# Patient Record
Sex: Female | Born: 1970
Health system: Southern US, Community
[De-identification: ages and names within clinical notes are randomized; demographics above are authoritative.]

## PROBLEM LIST (undated history)

## (undated) DIAGNOSIS — T7840XA Allergy, unspecified, initial encounter: Secondary | ICD-10-CM

## (undated) DIAGNOSIS — Z9889 Other specified postprocedural states: Secondary | ICD-10-CM

## (undated) DIAGNOSIS — K219 Gastro-esophageal reflux disease without esophagitis: Secondary | ICD-10-CM

## (undated) DIAGNOSIS — R112 Nausea with vomiting, unspecified: Secondary | ICD-10-CM

## (undated) DIAGNOSIS — S46819A Strain of other muscles, fascia and tendons at shoulder and upper arm level, unspecified arm, initial encounter: Secondary | ICD-10-CM

## (undated) DIAGNOSIS — K828 Other specified diseases of gallbladder: Secondary | ICD-10-CM

## (undated) DIAGNOSIS — E559 Vitamin D deficiency, unspecified: Secondary | ICD-10-CM

## (undated) DIAGNOSIS — R7309 Other abnormal glucose: Secondary | ICD-10-CM

## (undated) DIAGNOSIS — J45909 Unspecified asthma, uncomplicated: Secondary | ICD-10-CM

## (undated) DIAGNOSIS — R519 Headache, unspecified: Secondary | ICD-10-CM

## (undated) DIAGNOSIS — Z86018 Personal history of other benign neoplasm: Secondary | ICD-10-CM

## (undated) DIAGNOSIS — R51 Headache: Secondary | ICD-10-CM

## (undated) DIAGNOSIS — R7301 Impaired fasting glucose: Secondary | ICD-10-CM

## (undated) DIAGNOSIS — E7800 Pure hypercholesterolemia, unspecified: Secondary | ICD-10-CM

## (undated) DIAGNOSIS — R011 Cardiac murmur, unspecified: Secondary | ICD-10-CM

## (undated) DIAGNOSIS — D649 Anemia, unspecified: Secondary | ICD-10-CM

## (undated) HISTORY — DX: Gastro-esophageal reflux disease without esophagitis: K21.9

## (undated) HISTORY — DX: Other specified diseases of gallbladder: K82.8

## (undated) HISTORY — DX: Impaired fasting glucose: R73.01

## (undated) HISTORY — DX: Personal history of other benign neoplasm: Z86.018

## (undated) HISTORY — DX: Pure hypercholesterolemia, unspecified: E78.00

## (undated) HISTORY — PX: ESOPHAGOGASTRODUODENOSCOPY: SHX1529

## (undated) HISTORY — DX: Unspecified asthma, uncomplicated: J45.909

## (undated) HISTORY — DX: Other abnormal glucose: R73.09

## (undated) HISTORY — DX: Cardiac murmur, unspecified: R01.1

## (undated) HISTORY — DX: Vitamin D deficiency, unspecified: E55.9

## (undated) HISTORY — DX: Allergy, unspecified, initial encounter: T78.40XA

## (undated) HISTORY — DX: Strain of other muscles, fascia and tendons at shoulder and upper arm level, unspecified arm, initial encounter: S46.819A

---

## 2004-09-10 ENCOUNTER — Emergency Department: Payer: Self-pay | Admitting: Emergency Medicine

## 2014-03-24 ENCOUNTER — Ambulatory Visit: Payer: Self-pay | Admitting: Obstetrics and Gynecology

## 2015-02-03 ENCOUNTER — Other Ambulatory Visit: Payer: Self-pay | Admitting: Gastroenterology

## 2015-02-03 DIAGNOSIS — K219 Gastro-esophageal reflux disease without esophagitis: Secondary | ICD-10-CM

## 2015-03-02 ENCOUNTER — Ambulatory Visit (INDEPENDENT_AMBULATORY_CARE_PROVIDER_SITE_OTHER): Payer: BLUE CROSS/BLUE SHIELD | Admitting: Obstetrics and Gynecology

## 2015-03-02 ENCOUNTER — Encounter: Payer: Self-pay | Admitting: Obstetrics and Gynecology

## 2015-03-02 VITALS — BP 111/73 | HR 69 | Ht 64.0 in | Wt 154.0 lb

## 2015-03-02 DIAGNOSIS — E559 Vitamin D deficiency, unspecified: Secondary | ICD-10-CM

## 2015-03-02 DIAGNOSIS — Z01419 Encounter for gynecological examination (general) (routine) without abnormal findings: Secondary | ICD-10-CM

## 2015-03-02 DIAGNOSIS — Z86018 Personal history of other benign neoplasm: Secondary | ICD-10-CM

## 2015-03-02 DIAGNOSIS — Z1239 Encounter for other screening for malignant neoplasm of breast: Secondary | ICD-10-CM | POA: Diagnosis not present

## 2015-03-02 DIAGNOSIS — Z8742 Personal history of other diseases of the female genital tract: Secondary | ICD-10-CM | POA: Diagnosis not present

## 2015-03-02 NOTE — Progress Notes (Signed)
[place] NOTE  Subjective:    Judy Munoz is a 44 y.o. G1P1001 married Caucasian female who presents for an annual exam. The patient has no complaints today. The patient is sexually active. GYN screening history: last pap: approximate date 01/2014 and was normal and last mammogram: approximate date 03/2014 and was normal. The patient wears seatbelts: yes. The patient participates in regular exercise: no. Has the patient ever been transfused or tattooed?: no. The patient reports that there is not domestic violence in her life.   Menstrual History: Obstetric History   G1   P1   T1   P0   A0   TAB0   SAB0   E0   M0   L1     # Outcome Date GA Lbr Len/2nd Weight Sex Delivery Anes PTL Lv  1 Term 2014 [redacted]w[redacted]d 5 lb 12 oz (2.608 kg) F Vag-Spont  N Y      Menarche age: [age]Patient's last menstrual period was 02/14/2015.  Reports regular menses, lasting 3-4 days, with associated mild dysmenorrhea.  Denies h/o STIs or abnormal pap smears.     Past Medical History  Diagnosis Date  . GERD (gastroesophageal reflux disease)   . High cholesterol   . Elevated glucose   . History of uterine fibroid     Family History  Problem Relation Age of Onset  . Diabetes Father   . Heart disease Father   . Hypertension Father   . Hyperlipidemia Father   . Cancer Maternal Grandmother     uterine    History reviewed. No pertinent past surgical history.  Social History   Social History  . Marital Status: Married    Spouse Name: N/A  . Number of Children: N/A  . Years of Education: N/A   Occupational History  . Not on file.   Social History Main Topics  . Smoking status: Never Smoker   . Smokeless tobacco: Never Used  . Alcohol Use: No  . Drug Use: No  . Sexual Activity: Yes    Birth Control/ Protection: None   Other Topics Concern  . Not on file   Social History Narrative  . No narrative on file    No current outpatient prescriptions on file prior to visit.    No current facility-administered medications on file prior to visit.    Allergies  Allergen Reactions  . Sulfa Antibiotics     Review of Systems Constitutional: negative for chills, fatigue, fevers and sweats Eyes: negative for irritation, redness and visual disturbance Ears, nose, mouth, throat, and face: negative for hearing loss, nasal congestion, snoring and tinnitus Respiratory: negative for asthma, cough, sputum Cardiovascular: negative for chest pain, dyspnea, exertional chest pressure/discomfort, irregular heart beat, palpitations and syncope Gastrointestinal: negative for abdominal pain, change in bowel habits, nausea and vomiting Genitourinary: negative for abnormal menstrual periods, genital lesions, sexual problems and vaginal discharge, dysuria and urinary incontinence Integument/breast: negative for breast lump, breast tenderness and nipple discharge Hematologic/lymphatic: negative for bleeding and easy bruising Musculoskeletal:negative for back pain and muscle weakness Neurological: negative for dizziness, headaches, vertigo and weakness Endocrine: negative for diabetic symptoms including polydipsia, polyuria and skin dryness Allergic/Immunologic: negative for hay fever and urticaria     Objective:    BP 111/73 mmHg  Pulse 69  Ht 5' 4"  (1.626 m)  Wt 154 lb (69.854 kg)  BMI 26.42 kg/m2  LMP 02/14/2015.    General Appearance:    Alert, cooperative, no distress, appears stated age  Head:    Normocephalic, without obvious abnormality, atraumatic  Eyes:    PERRL, conjunctiva/corneas clear, EOM's intact, both eyes  Ears:    Normal external ear canals, both ears  Nose:   Nares normal, septum midline, mucosa normal, no drainage or sinus tenderness  Throat:   Lips, mucosa, and tongue normal; teeth and gums normal  Neck:   Supple, symmetrical, trachea midline, no adenopathy; thyroid: no enlargement/tenderness/nodules; no carotid bruit or JVD  Back:     Symmetric, no  curvature, ROM normal, no CVA tenderness  Lungs:     Clear to auscultation bilaterally, respirations unlabored  Chest Wall:    No tenderness or deformity   Heart:    Regular rate and rhythm, S1 and S2 normal, no murmur, rub or gallop  Breast Exam:    No tenderness, masses, or nipple abnormality  Abdomen:     Soft, non-tender, bowel sounds active all four quadrants, no masses, no organomegaly.    Genitalia:    External genitalia normal, vagina with lesions, discharge, or tenderness, rectovaginal septum  normal. Cervix normal in appearance, no cervical motion tenderness, no adnexal masses or tenderness.  Uterus slightly enlarged, 10-12 week size, mobile, normal contour, mobile.  Rectal:    Normal external sphincter.  No hemorrhoids appreciated. Internal exam not done.   Extremities:   Extremities normal, atraumatic, no cyanosis or edema  Pulses:   2+ and symmetric all extremities  Skin:   Skin color, texture, turgor normal, no rashes or lesions  Lymph nodes:   Cervical, supraclavicular, and axillary nodes normal  Neurologic:   CNII-XII intact, normal strength, sensation and reflexes throughout       Assessment:    Healthy female exam.   H/o Vit D deficiency H/o uterine fibroids   Plan:     Blood tests: Complete metabolic panel, CBC with diff and Lipid panel, HgbA1c, Vit D level. Breast self exam technique reviewed and patient encouraged to perform self-exam monthly. Contraception: none.  Declines use of contraception.  Counseled on risk of ectopic and AMA status if pregnancy occurred.  Diagnosis explained in detail, including differential. Discussed healthy lifestyle modifications.   Mammogram ordered.  Received flu vaccine last week.  Declines STI testing.  H/o fibroids with stable (unchanged) uterine size.  Patient denies complaints. Will continue expectant management.  Follow up in 1 year for annual exam.    Rubie Maid, MD [facility]

## 2015-03-03 LAB — LIPID PANEL
CHOL/HDL RATIO: 3.9 ratio (ref 0.0–4.4)
CHOLESTEROL TOTAL: 238 mg/dL — AB (ref 100–199)
HDL: 61 mg/dL (ref >39)
LDL CALC: 154 mg/dL — AB (ref 0–99)
TRIGLYCERIDES: 117 mg/dL (ref 0–149)
VLDL CHOLESTEROL CAL: 23 mg/dL (ref 5–40)

## 2015-03-03 LAB — HEMOGLOBIN A1C
Est. average glucose Bld gHb Est-mCnc: 123 mg/dL
HEMOGLOBIN A1C: 5.9 % — AB (ref 4.8–5.6)

## 2015-03-03 LAB — COMPREHENSIVE METABOLIC PANEL
A/G RATIO: 2.1 (ref 1.1–2.5)
ALK PHOS: 49 IU/L (ref 39–117)
ALT: 13 IU/L (ref 0–32)
AST: 17 IU/L (ref 0–40)
Albumin: 4.6 g/dL (ref 3.5–5.5)
BILIRUBIN TOTAL: 0.3 mg/dL (ref 0.0–1.2)
BUN/Creatinine Ratio: 14 (ref 9–23)
BUN: 11 mg/dL (ref 6–24)
CHLORIDE: 99 mmol/L (ref 97–106)
CO2: 24 mmol/L (ref 18–29)
Calcium: 9.6 mg/dL (ref 8.7–10.2)
Creatinine, Ser: 0.81 mg/dL (ref 0.57–1.00)
GFR calc Af Amer: 103 mL/min/{1.73_m2} (ref >59)
GFR calc non Af Amer: 89 mL/min/{1.73_m2} (ref >59)
Globulin, Total: 2.2 g/dL (ref 1.5–4.5)
Glucose: 102 mg/dL — ABNORMAL HIGH (ref 65–99)
POTASSIUM: 4.5 mmol/L (ref 3.5–5.2)
Sodium: 138 mmol/L (ref 136–144)
Total Protein: 6.8 g/dL (ref 6.0–8.5)

## 2015-03-03 LAB — CBC
HEMATOCRIT: 40.3 % (ref 34.0–46.6)
HEMOGLOBIN: 13.2 g/dL (ref 11.1–15.9)
MCH: 28.4 pg (ref 26.6–33.0)
MCHC: 32.8 g/dL (ref 31.5–35.7)
MCV: 87 fL (ref 79–97)
Platelets: 320 10*3/uL (ref 150–379)
RBC: 4.64 x10E6/uL (ref 3.77–5.28)
RDW: 14.1 % (ref 12.3–15.4)
WBC: 6.9 10*3/uL (ref 3.4–10.8)

## 2015-03-03 LAB — VITAMIN D 25 HYDROXY (VIT D DEFICIENCY, FRACTURES): VIT D 25 HYDROXY: 39.1 ng/mL (ref 30.0–100.0)

## 2015-03-05 ENCOUNTER — Other Ambulatory Visit: Payer: Self-pay | Admitting: Obstetrics and Gynecology

## 2015-03-05 DIAGNOSIS — Z1231 Encounter for screening mammogram for malignant neoplasm of breast: Secondary | ICD-10-CM

## 2015-03-12 ENCOUNTER — Telehealth: Payer: Self-pay

## 2015-03-12 ENCOUNTER — Telehealth: Payer: Self-pay | Admitting: Family Medicine

## 2015-03-12 NOTE — Telephone Encounter (Signed)
Called pt informed her of elevated lab results. Pt states she is aware they have been elevated in the past. Pt states she will call PCP for further follow up.

## 2015-03-12 NOTE — Telephone Encounter (Signed)
She had her labs drawn thru  Dr.Cherry. They are in EPIC. They advised her to call us to discuss results and eval for treatment. Patient states she's already been working on her diet and exercise since May.

## 2015-03-12 NOTE — Telephone Encounter (Signed)
-----   Message from Rubie Maid, MD sent at 03/08/2015 10:14 AM EST ----- Please inform patient of abnormal labs:  1) Elevated HgbA1c - appears patient may be pre-diabetic.  Recommend dietary modification and exercise.  Can refer to nutritionist if desired.  2) Elevated cholesterol.    Would recommend patient to be followed up by a PCP if she has one.  Otherwise, can refer her to one for further f/u and management.

## 2015-03-12 NOTE — Telephone Encounter (Signed)
Pt called stated she had labs drawn at her [facility] visit and some of her levels were elevated. Please call pt to follow up. Thanks.

## 2015-03-15 DIAGNOSIS — K219 Gastro-esophageal reflux disease without esophagitis: Secondary | ICD-10-CM | POA: Insufficient documentation

## 2015-03-15 DIAGNOSIS — E7800 Pure hypercholesterolemia, unspecified: Secondary | ICD-10-CM | POA: Insufficient documentation

## 2015-03-15 DIAGNOSIS — E559 Vitamin D deficiency, unspecified: Secondary | ICD-10-CM | POA: Insufficient documentation

## 2015-03-15 DIAGNOSIS — R7301 Impaired fasting glucose: Secondary | ICD-10-CM | POA: Insufficient documentation

## 2015-03-15 NOTE — Telephone Encounter (Signed)
Appointment please

## 2015-03-15 NOTE — Telephone Encounter (Signed)
Patient schedule f/u appt on 03/18/15 in the morning.

## 2015-03-18 ENCOUNTER — Ambulatory Visit (INDEPENDENT_AMBULATORY_CARE_PROVIDER_SITE_OTHER): Payer: BLUE CROSS/BLUE SHIELD | Admitting: Family Medicine

## 2015-03-18 ENCOUNTER — Encounter: Payer: Self-pay | Admitting: Family Medicine

## 2015-03-18 VITALS — BP 112/75 | HR 64 | Temp 97.1°F | Ht 63.25 in | Wt 152.0 lb

## 2015-03-18 DIAGNOSIS — E663 Overweight: Secondary | ICD-10-CM | POA: Diagnosis not present

## 2015-03-18 DIAGNOSIS — R0789 Other chest pain: Secondary | ICD-10-CM | POA: Insufficient documentation

## 2015-03-18 DIAGNOSIS — S46819A Strain of other muscles, fascia and tendons at shoulder and upper arm level, unspecified arm, initial encounter: Secondary | ICD-10-CM | POA: Diagnosis not present

## 2015-03-18 DIAGNOSIS — K219 Gastro-esophageal reflux disease without esophagitis: Secondary | ICD-10-CM | POA: Diagnosis not present

## 2015-03-18 DIAGNOSIS — R7301 Impaired fasting glucose: Secondary | ICD-10-CM | POA: Diagnosis not present

## 2015-03-18 DIAGNOSIS — E7800 Pure hypercholesterolemia, unspecified: Secondary | ICD-10-CM | POA: Diagnosis not present

## 2015-03-18 MED ORDER — PRAVASTATIN SODIUM 20 MG PO TABS: 20.0000 mg | ORAL_TABLET | Freq: Every day | ORAL | Status: DC

## 2015-03-18 MED ORDER — ASPIRIN EC 81 MG PO TBEC: 81.0000 mg | DELAYED_RELEASE_TABLET | Freq: Every day | ORAL | Status: DC

## 2015-03-18 MED ORDER — CYCLOBENZAPRINE HCL 10 MG PO TABS: 10.0000 mg | ORAL_TABLET | Freq: Three times a day (TID) | ORAL | Status: DC | PRN

## 2015-03-18 NOTE — Assessment & Plan Note (Addendum)
EKG today; aspirin 81 mg daily; start statin; refer to cards given her strong family history of coronary artery disease

## 2015-03-18 NOTE — Progress Notes (Signed)
BP 112/75 mmHg  Pulse 64  Temp(Src) 97.1 F (36.2 C)  Ht 5' 3.25" (1.607 m)  Wt 152 lb (68.947 kg)  BMI 26.70 kg/m2  SpO2 99%  LMP 03/11/2015 (Exact Date)   Subjective:    Patient ID: Judy Munoz, female    DOB: 10-06-1970, 44 y.o.   MRN: [id number]  HPI: Judy Munoz is a 44 y.o. female  Chief Complaint  Patient presents with  . Hyperlipidemia    discuss lab results from GYN   High cholesterol; usually skips breakfast; lunch is usually a sandwich peanut butter and jelly; honey wheat; sometimes banana and mayo; supper might be chicken breasts, crock pot or baked, mashed potatoes; more salads but that's with regular salad dressing; country crock margarine; canola oil; not really exercising; needs to start walking; getting some fiber, not many beans  She had labs done recently at [place] office and they instructed her to f/u with me about them Lab Results  Component Value Date   CHOL 238* 03/02/2015   HDL 61 03/02/2015   LDLCALC 154* 03/02/2015   TRIG 117 03/02/2015   CHOLHDL 3.9 03/02/2015  We reviewed these together; she used to take Lipitor but it gave her muscle cramps; since then just watching her diet High cholesterol runs in the family; father has it bad, heart disease; 6 out of the 7 have diabetes and high cholesterol, 5 out of the 7 have had open heart surgery  Prediabetes; father is diabetes on insulin; mother is prediabetes on metformin; white potatoes; honey wheat; no more than 2 slices a day; tried to cut back on her soft drinks, Dr. Malachi Bonds aggravates acid reflux; drinks mainly coffee and tea; cut back on the sugar in the tea; she tried stevia, but prefers black coffee instead of with stevia Lab Results  Component Value Date   HGBA1C 5.9* 03/02/2015   Relevant past medical, surgical, family and social history reviewed and updated as indicated. Interim medical history since our last visit reviewed. No medical excitement or trips to ER Allergies and medications  reviewed and updated.  Review of Systems  Respiratory: Negative for shortness of breath.   Cardiovascular: Negative for chest pain, palpitations and leg swelling (feet were swollen last week before her period started, then period started and it went away; was retaining fluid).  Gastrointestinal: Negative for abdominal pain and blood in stool.  Musculoskeletal: Positive for myalgias (used muscle rub on shoudlers after raking and putting up decorations; both shoulders (trap); better this morning).  She was really stressed a month ago, mother was in the hospital, they transferred her mother, mother needed a stent in her pancreas; she had some pressure in her chest in the middle of the night, no SHOB or nausea; thinks it was anxiety; took some Tums and went away; felt a lot of anxiety; lasted... I don't know, I don't remember; never felt like that before; felt like pressure; kind of scared her; mother was having surgery that day; really stressed  Per HPI unless specifically indicated above     Objective:    BP 112/75 mmHg  Pulse 64  Temp(Src) 97.1 F (36.2 C)  Ht 5' 3.25" (1.607 m)  Wt 152 lb (68.947 kg)  BMI 26.70 kg/m2  SpO2 99%  LMP 03/11/2015 (Exact Date)  Wt Readings from Last 3 Encounters:  03/19/15 150 lb 12 oz (68.38 kg)  03/18/15 152 lb (68.947 kg)  09/15/14 153 lb (69.4 kg)   body mass index is 26.7 kg/(m^2).  Physical  Exam  Constitutional: She appears well-developed and well-nourished. No distress.  HENT:  Head: Normocephalic and atraumatic.  Eyes: EOM are normal. No scleral icterus.  Neck: No thyromegaly present.  Cardiovascular: Normal rate, regular rhythm and normal heart sounds.   No murmur heard. Pulmonary/Chest: Effort normal and breath sounds normal. No respiratory distress. She has no wheezes.  Abdominal: Soft. Bowel sounds are normal. She exhibits no distension.  Musculoskeletal: Normal range of motion. She exhibits no edema.  Neurological: She is alert. She  exhibits normal muscle tone.  Skin: Skin is warm and dry. She is not diaphoretic. No pallor.  Psychiatric: She has a normal mood and affect. Her behavior is normal. Judgment and thought content normal.    Results for orders placed or performed in visit on 03/02/15  CBC  Result Value Ref Range   WBC 6.9 3.4 - 10.8 x10E3/uL   RBC 4.64 3.77 - 5.28 x10E6/uL   Hemoglobin 13.2 11.1 - 15.9 g/dL   Hematocrit 40.3 34.0 - 46.6 %   MCV 87 79 - 97 fL   MCH 28.4 26.6 - 33.0 pg   MCHC 32.8 31.5 - 35.7 g/dL   RDW 14.1 12.3 - 15.4 %   Platelets 320 150 - 379 x10E3/uL  Lipid panel  Result Value Ref Range   Cholesterol, Total 238 (H) 100 - 199 mg/dL   Triglycerides 117 0 - 149 mg/dL   HDL 61 >39 mg/dL   VLDL Cholesterol Cal 23 5 - 40 mg/dL   LDL Calculated 154 (H) 0 - 99 mg/dL   Chol/HDL Ratio 3.9 0.0 - 4.4 ratio units  Comprehensive metabolic panel  Result Value Ref Range   Glucose 102 (H) 65 - 99 mg/dL   BUN 11 6 - 24 mg/dL   Creatinine, Ser 0.81 0.57 - 1.00 mg/dL   GFR calc non Af Amer 89 >59 mL/min/1.73   GFR calc Af Amer 103 >59 mL/min/1.73   BUN/Creatinine Ratio 14 9 - 23   Sodium 138 136 - 144 mmol/L   Potassium 4.5 3.5 - 5.2 mmol/L   Chloride 99 97 - 106 mmol/L   CO2 24 18 - 29 mmol/L   Calcium 9.6 8.7 - 10.2 mg/dL   Total Protein 6.8 6.0 - 8.5 g/dL   Albumin 4.6 3.5 - 5.5 g/dL   Globulin, Total 2.2 1.5 - 4.5 g/dL   Albumin/Globulin Ratio 2.1 1.1 - 2.5   Bilirubin Total 0.3 0.0 - 1.2 mg/dL   Alkaline Phosphatase 49 39 - 117 IU/L   AST 17 0 - 40 IU/L   ALT 13 0 - 32 IU/L  HgB A1c  Result Value Ref Range   Hgb A1c MFr Bld 5.9 (H) 4.8 - 5.6 %   Est. average glucose Bld gHb Est-mCnc 123 mg/dL  Vitamin D (25 hydroxy)  Result Value Ref Range   Vit D, 25-Hydroxy 39.1 30.0 - 100.0 ng/mL      Assessment & Plan:   Problem List Items Addressed This Visit      Digestive   GERD (gastroesophageal reflux disease)    While acid reflux could have been responsible for her symptoms  last month when she was under so much stress, can't r/o cardiac etiology with her very strong family hx of heart disease; EKG today; if symptoms recur, call 911; avoid triggers foods, limit stress        Endocrine   IFG (impaired fasting glucose)    Reviewed A1C with patient; encouraged weight loss, healthier eating; she has  strong fam hx so we can't change her genes        Musculoskeletal and Integument   Trapezius muscle strain     Other   High cholesterol - Primary    LDL of 154 with strong family hx of heart disease; decrease saturated fats, check labels, switch to fat free salad dressing, limit mayo, etc. Start low dose statin; weight loss encouraged, just modest; increase activity, start walking 10 minutes five days a week and build up      Relevant Medications   pravastatin (PRAVACHOL) 20 MG tablet   aspirin EC 81 MG tablet   Pressure in chest    EKG today; aspirin 81 mg daily; start statin; refer to cards given her strong family history of coronary artery disease      Relevant Medications   aspirin EC 81 MG tablet   Other Relevant Orders   EKG 12-Lead (Completed)   Ambulatory referral to Cardiology   Overweight (BMI 25.0-29.9)    Suggested mild weight reduction         Follow up plan: Return in about 12 weeks (around 06/10/2015) for visit for high cholesterol, fasting labs one week prior.  An after-visit summary was printed and given to the patient at [place].  Please see the patient instructions which may contain other information and recommendations beyond what is mentioned above in the assessment and plan.

## 2015-03-18 NOTE — Assessment & Plan Note (Signed)
LDL of 154 with strong family hx of heart disease; decrease saturated fats, check labels, switch to fat free salad dressing, limit mayo, etc. Start low dose statin; weight loss encouraged, just modest; increase activity, start walking 10 minutes five days a week and build up

## 2015-03-18 NOTE — Assessment & Plan Note (Signed)
Reviewed A1C with patient; encouraged weight loss, healthier eating; she has strong fam hx so we can't change her genes

## 2015-03-18 NOTE — Assessment & Plan Note (Signed)
While acid reflux could have been responsible for her symptoms last month when she was under so much stress, can't r/o cardiac etiology with her very strong family hx of heart disease; EKG today; if symptoms recur, call 911; avoid triggers foods, limit stress

## 2015-03-18 NOTE — Patient Instructions (Addendum)
Check out [employer] and other websites for [employer] ideas and recipes Try meatless crumbles, Boca burgers, Sweet Earth seitan, [place] vegan sausage mix (all plant protein), Sweet Earth [place] breakfast burritos, etc. You can further limit intake of saturated fats by switching to dairy free products (coconut milk coffee creamer, almond milk, Tofutti brand cream cheese and sour cream, Follow Your Heart vegenaise (mayonnaise alternative) and Follow Your Heart cheese alternative (the Antoine Primas is my favorite) Start 81 mg aspirin (coated) and take one a day We'll have you see the heart doctor to see about getting a stress test If you develop chest pain or pressure again, call 911 Start the new cholesterol medicine Return in 12 weeks to see me, and please have fasting labs done one week before that visit   Gastroesophageal Reflux Disease, Adult Normally, food travels down the esophagus and stays in the stomach to be digested. However, when a person has gastroesophageal reflux disease (GERD), food and stomach acid move back up into the esophagus. When this happens, the esophagus becomes sore and inflamed. Over time, GERD can create small holes (ulcers) in the lining of the esophagus.  CAUSES This condition is caused by a problem with the muscle between the esophagus and the stomach (lower esophageal sphincter, or LES). Normally, the LES muscle closes after food passes through the esophagus to the stomach. When the LES is weakened or abnormal, it does not close properly, and that allows food and stomach acid to go back up into the esophagus. The LES can be weakened by certain dietary substances, medicines, and medical conditions, including:  Tobacco use.  Pregnancy.  Having a hiatal hernia.  Heavy alcohol use.  Certain foods and beverages, such as coffee, chocolate, onions, and peppermint. RISK FACTORS This condition is more likely to develop in:  People who have an increased body  weight.  People who have connective tissue disorders.  People who use NSAID medicines. SYMPTOMS Symptoms of this condition include:  Heartburn.  Difficult or painful swallowing.  The feeling of having a lump in the throat.  Abitter taste in the mouth.  Bad breath.  Having a large amount of saliva.  Having an upset or bloated stomach.  Belching.  Chest pain.  Shortness of breath or wheezing.  Ongoing (chronic) cough or a night-time cough.  Wearing away of tooth enamel.  Weight loss. Different conditions can cause chest pain. Make sure to see your health care provider if you experience chest pain. DIAGNOSIS Your health care provider will take a medical history and perform a physical exam. To determine if you have mild or severe GERD, your health care provider may also monitor how you respond to treatment. You may also have other tests, including:  An endoscopy toexamine your stomach and esophagus with a small camera.  A test thatmeasures the acidity level in your esophagus.  A test thatmeasures how much pressure is on your esophagus.  A barium swallow or modified barium swallow to show the shape, size, and functioning of your esophagus. TREATMENT The goal of treatment is to help relieve your symptoms and to prevent complications. Treatment for this condition may vary depending on how severe your symptoms are. Your health care provider may recommend:  Changes to your diet.  Medicine.  Surgery. HOME CARE INSTRUCTIONS Diet  Follow a diet as recommended by your health care provider. This may involve avoiding foods and drinks such as:  Coffee and tea (with or without caffeine).  Drinks that  containalcohol.  Energy drinks and sports drinks.  Carbonated drinks or sodas.  Chocolate and cocoa.  Peppermint and mint flavorings.  Garlic and onions.  Horseradish.  Spicy and acidic foods, including peppers, chili powder, curry powder, vinegar, hot sauces,  and barbecue sauce.  Citrus fruit juices and citrus fruits, such as oranges, lemons, and limes.  Tomato-based foods, such as red sauce, chili, salsa, and pizza with red sauce.  Fried and fatty foods, such as donuts, french fries, potato chips, and high-fat dressings.  High-fat meats, such as hot dogs and fatty cuts of red and white meats, such as rib eye steak, sausage, ham, and bacon.  High-fat dairy items, such as whole milk, butter, and cream cheese.  Eat small, frequent meals instead of large meals.  Avoid drinking large amounts of liquid with your meals.  Avoid eating meals during the 2-3 hours before bedtime.  Avoid lying down right after you eat.  Do not exercise right after you eat. General Instructions  Pay attention to any changes in your symptoms.  Take over-the-counter and prescription medicines only as told by your health care provider. Do not take aspirin, ibuprofen, or other NSAIDs unless your health care provider told you to do so.  Do not use any tobacco products, including cigarettes, chewing tobacco, and e-cigarettes. If you need help quitting, ask your health care provider.  Wear loose-fitting clothing. Do not wear anything tight around your waist that causes pressure on your abdomen.  Raise (elevate) the head of your bed 6 inches (15cm).  Try to reduce your stress, such as with yoga or meditation. If you need help reducing stress, ask your health care provider.  If you are overweight, reduce your weight to an amount that is healthy for you. Ask your health care provider for guidance about a safe weight loss goal.  Keep all follow-up visits as told by your health care provider. This is important. SEEK MEDICAL CARE IF:  You have new symptoms.  You have unexplained weight loss.  You have difficulty swallowing, or it hurts to swallow.  You have wheezing or a persistent cough.  Your symptoms do not improve with treatment.  You have a hoarse  voice. SEEK IMMEDIATE MEDICAL CARE IF:  You have pain in your arms, neck, jaw, teeth, or back.  You feel sweaty, dizzy, or light-headed.  You have chest pain or shortness of breath.  You vomit and your vomit looks like blood or coffee grounds.  You faint.  Your stool is bloody or black.  You cannot swallow, drink, or eat.   This information is not intended to replace advice given to you by your health care provider. Make sure you discuss any questions you have with your health care provider.   Document Released: 01/25/2005 Document Revised: 01/06/2015 Document Reviewed: 08/12/2014 Elsevier Interactive Patient Education [employer].

## 2015-03-19 ENCOUNTER — Encounter: Payer: Self-pay | Admitting: Cardiovascular Disease

## 2015-03-19 ENCOUNTER — Ambulatory Visit (INDEPENDENT_AMBULATORY_CARE_PROVIDER_SITE_OTHER): Payer: BLUE CROSS/BLUE SHIELD | Admitting: Cardiovascular Disease

## 2015-03-19 VITALS — BP 114/72 | HR 86 | Ht 63.0 in | Wt 150.8 lb

## 2015-03-19 DIAGNOSIS — E7800 Pure hypercholesterolemia, unspecified: Secondary | ICD-10-CM

## 2015-03-19 DIAGNOSIS — R0789 Other chest pain: Secondary | ICD-10-CM

## 2015-03-19 NOTE — Progress Notes (Signed)
PCP: Dr. Sanda Klein  HPI  This is a pleasant 44 year old female who was referred for evaluation of chest pain. She has no previous cardiac history. She has known history of hyperlipidemia and gastroesophageal reflux disease. She has family history of coronary artery disease. Her father had myocardial infarction at the age of 93 and he was diabetic. She had one episode a few weeks ago of substernal chest tightness that woke her up from sleep. It lasted for about 10-15 minutes and resolved without intervention. She had no similar symptoms since then. Nonetheless, she has frequent heartburn that usually responds to antacids. The chest pain is not exertional. She has no exertional symptoms including no shortness of breath or palpitations. She does not exercise on a regular basis. She has known history of hyperlipidemia I did not tolerate atorvastatin in the past. She was started on pravastatin yesterday.  Allergies  Allergen Reactions  . Sulfa Antibiotics      Current Outpatient Prescriptions on File Prior to Visit  Medication Sig Dispense Refill  . aspirin EC 81 MG tablet Take 1 tablet (81 mg total) by mouth daily. 30 tablet 1  . cetirizine (ZYRTEC) 10 MG tablet Take 10 mg by mouth daily.    . cholecalciferol (VITAMIN D) 1000 UNITS tablet Take 1,000 Units by mouth daily.    . cyclobenzaprine (FLEXERIL) 10 MG tablet Take 1 tablet (10 mg total) by mouth 3 (three) times daily as needed for muscle spasms. Do not drive for 8 hours after taking 15 tablet 0  . pantoprazole (PROTONIX) 40 MG tablet TAKE 1 TABLET BY MOUTH EVERY DAY 30 tablet 6  . pravastatin (PRAVACHOL) 20 MG tablet Take 1 tablet (20 mg total) by mouth at bedtime. 30 tablet 2   No current facility-administered medications on file prior to visit.     Past Medical History  Diagnosis Date  . Elevated glucose   . History of uterine fibroid   . Vitamin D deficiency   . GERD (gastroesophageal reflux disease)   . High cholesterol   . IFG  (impaired fasting glucose)   . Heart murmur      History reviewed. No pertinent past surgical history.   Family History  Problem Relation Age of Onset  . Diabetes Father   . Heart disease Father   . Hypertension Father   . Hyperlipidemia Father   . Cancer Maternal Grandmother     uterine     Social History   Social History  . Marital Status: Married    Spouse Name: N/A  . Number of Children: N/A  . Years of Education: N/A   Occupational History  . Not on file.   Social History Main Topics  . Smoking status: Never Smoker   . Smokeless tobacco: Never Used  . Alcohol Use: No  . Drug Use: No  . Sexual Activity: Yes    Birth Control/ Protection: None   Other Topics Concern  . Not on file   Social History Narrative     ROS A 10 point review of system was performed. It is negative other than that mentioned in the history of present illness.   PHYSICAL EXAM   BP 114/72 mmHg  Pulse 86  Ht 5' 3"  (1.6 m)  Wt 150 lb 12 oz (68.38 kg)  BMI 26.71 kg/m2  LMP 03/11/2015 (Exact Date) Constitutional: She is oriented to person, place, and time. She appears well-developed and well-nourished. No distress.  HENT: No nasal discharge.  Head: Normocephalic and atraumatic.  Eyes: Pupils are equal and round. No discharge.  Neck: Normal range of motion. Neck supple. No JVD present. No thyromegaly present.  Cardiovascular: Normal rate, regular rhythm, normal heart sounds. Exam reveals no gallop and no friction rub. No murmur heard.  Pulmonary/Chest: Effort normal and breath sounds normal. No stridor. No respiratory distress. She has no wheezes. She has no rales. She exhibits no tenderness.  Abdominal: Soft. Bowel sounds are normal. She exhibits no distension. There is no tenderness. There is no rebound and no guarding.  Musculoskeletal: Normal range of motion. She exhibits no edema and no tenderness.  Neurological: She is alert and oriented to person, place, and time.  Coordination normal.  Skin: Skin is warm and dry. No rash noted. She is not diaphoretic. No erythema. No pallor.  Psychiatric: She has a normal mood and affect. Her behavior is normal. Judgment and thought content normal.      EKG from yesterday showed sinus rhythm with RSR prime which is a normal variant.   ASSESSMENT AND PLAN

## 2015-03-19 NOTE — Patient Instructions (Signed)
Follow up as needed

## 2015-03-19 NOTE — Assessment & Plan Note (Signed)
I suspect that the one episode of substernal chest tightness that woke her up from sleep was likely due to esophageal spasm in the setting of GERD. No recurrent episodes since then. Her other symptoms of heartburn seems to be suggestive of GERD. She has no exertional symptoms and does nothing to suggest angina. Her cardiac exam is unremarkable. Baseline ECG is normal variant especially in young patients. Thus, given that the pretest probability of coronary artery disease is very low, I do not recommend any further ischemic cardiac evaluation. Continue aggressive treatment of risk factors and lifestyle changes to decrease chances of future cardiac events. No restrictions on physical activities or exercise.

## 2015-03-19 NOTE — Assessment & Plan Note (Signed)
I encouraged her to continue taking pravastatin and also to work on healthy lifestyle changes.

## 2015-03-23 DIAGNOSIS — E663 Overweight: Secondary | ICD-10-CM | POA: Insufficient documentation

## 2015-03-23 NOTE — Assessment & Plan Note (Signed)
Suggested mild weight reduction

## 2015-03-29 ENCOUNTER — Ambulatory Visit
Admission: RE | Admit: 2015-03-29 | Discharge: 2015-03-29 | Disposition: A | Discharge status: Home / Self Care | Payer: BLUE CROSS/BLUE SHIELD | Source: Ambulatory Visit | Attending: Obstetrics and Gynecology | Admitting: Obstetrics and Gynecology

## 2015-03-29 DIAGNOSIS — Z1231 Encounter for screening mammogram for malignant neoplasm of breast: Secondary | ICD-10-CM | POA: Diagnosis not present

## 2015-06-03 ENCOUNTER — Other Ambulatory Visit: Payer: Self-pay | Admitting: Family Medicine

## 2015-06-03 ENCOUNTER — Other Ambulatory Visit: Payer: BLUE CROSS/BLUE SHIELD

## 2015-06-03 DIAGNOSIS — R7301 Impaired fasting glucose: Secondary | ICD-10-CM

## 2015-06-03 DIAGNOSIS — E7800 Pure hypercholesterolemia, unspecified: Secondary | ICD-10-CM

## 2015-06-03 DIAGNOSIS — E559 Vitamin D deficiency, unspecified: Secondary | ICD-10-CM

## 2015-06-03 DIAGNOSIS — Z5181 Encounter for therapeutic drug level monitoring: Secondary | ICD-10-CM

## 2015-06-03 NOTE — Assessment & Plan Note (Signed)
lab

## 2015-06-04 ENCOUNTER — Other Ambulatory Visit: Payer: Self-pay | Admitting: Family Medicine

## 2015-06-04 LAB — COMPREHENSIVE METABOLIC PANEL
ALK PHOS: 44 IU/L (ref 39–117)
ALT: 10 IU/L (ref 0–32)
AST: 16 IU/L (ref 0–40)
Albumin/Globulin Ratio: 2 (ref 1.1–2.5)
Albumin: 4.3 g/dL (ref 3.5–5.5)
BUN/Creatinine Ratio: 16 (ref 9–23)
BUN: 13 mg/dL (ref 6–24)
Bilirubin Total: 0.3 mg/dL (ref 0.0–1.2)
CO2: 22 mmol/L (ref 18–29)
CREATININE: 0.79 mg/dL (ref 0.57–1.00)
Calcium: 9.4 mg/dL (ref 8.7–10.2)
Chloride: 102 mmol/L (ref 96–106)
GFR calc Af Amer: 106 mL/min/{1.73_m2} (ref >59)
GFR calc non Af Amer: 92 mL/min/{1.73_m2} (ref >59)
GLUCOSE: 105 mg/dL — AB (ref 65–99)
Globulin, Total: 2.2 g/dL (ref 1.5–4.5)
Potassium: 4.6 mmol/L (ref 3.5–5.2)
Sodium: 140 mmol/L (ref 134–144)
Total Protein: 6.5 g/dL (ref 6.0–8.5)

## 2015-06-04 LAB — LIPID PANEL W/O CHOL/HDL RATIO
CHOLESTEROL TOTAL: 163 mg/dL (ref 100–199)
HDL: 57 mg/dL (ref >39)
LDL CALC: 88 mg/dL (ref 0–99)
TRIGLYCERIDES: 89 mg/dL (ref 0–149)
VLDL CHOLESTEROL CAL: 18 mg/dL (ref 5–40)

## 2015-06-04 LAB — HGB A1C W/O EAG: Hgb A1c MFr Bld: 5.9 % — ABNORMAL HIGH (ref 4.8–5.6)

## 2015-06-04 MED ORDER — PRAVASTATIN SODIUM 20 MG PO TABS: 20.0000 mg | ORAL_TABLET | Freq: Every day | ORAL | Status: DC

## 2015-06-10 ENCOUNTER — Ambulatory Visit: Payer: Self-pay | Admitting: Family Medicine

## 2015-07-05 ENCOUNTER — Other Ambulatory Visit: Payer: Self-pay

## 2015-07-05 DIAGNOSIS — K219 Gastro-esophageal reflux disease without esophagitis: Secondary | ICD-10-CM

## 2015-07-05 MED ORDER — PANTOPRAZOLE SODIUM 40 MG PO TBEC: 40.0000 mg | DELAYED_RELEASE_TABLET | Freq: Every day | ORAL | Status: DC

## 2015-07-13 ENCOUNTER — Other Ambulatory Visit: Payer: Self-pay | Admitting: Gastroenterology

## 2015-07-15 ENCOUNTER — Other Ambulatory Visit: Payer: Self-pay

## 2015-07-20 ENCOUNTER — Encounter: Payer: Self-pay | Admitting: *Deleted

## 2015-07-23 NOTE — Discharge Instructions (Signed)

## 2015-07-26 ENCOUNTER — Ambulatory Visit
Admission: RE | Admit: 2015-07-26 | Discharge: 2015-07-26 | Disposition: A | Discharge status: Home / Self Care | Payer: BLUE CROSS/BLUE SHIELD | Source: Ambulatory Visit | Attending: Gastroenterology | Admitting: Gastroenterology

## 2015-07-26 ENCOUNTER — Ambulatory Visit: Payer: BLUE CROSS/BLUE SHIELD | Admitting: Anesthesiology

## 2015-07-26 ENCOUNTER — Encounter
Admission: RE | Disposition: A | Discharge status: Home / Self Care | Payer: Self-pay | Source: Ambulatory Visit | Attending: Gastroenterology

## 2015-07-26 DIAGNOSIS — Z8249 Family history of ischemic heart disease and other diseases of the circulatory system: Secondary | ICD-10-CM | POA: Insufficient documentation

## 2015-07-26 DIAGNOSIS — K2970 Gastritis, unspecified, without bleeding: Secondary | ICD-10-CM | POA: Diagnosis not present

## 2015-07-26 DIAGNOSIS — R011 Cardiac murmur, unspecified: Secondary | ICD-10-CM | POA: Diagnosis not present

## 2015-07-26 DIAGNOSIS — Z803 Family history of malignant neoplasm of breast: Secondary | ICD-10-CM | POA: Diagnosis not present

## 2015-07-26 DIAGNOSIS — Z882 Allergy status to sulfonamides status: Secondary | ICD-10-CM | POA: Insufficient documentation

## 2015-07-26 DIAGNOSIS — Z8049 Family history of malignant neoplasm of other genital organs: Secondary | ICD-10-CM | POA: Insufficient documentation

## 2015-07-26 DIAGNOSIS — E559 Vitamin D deficiency, unspecified: Secondary | ICD-10-CM | POA: Insufficient documentation

## 2015-07-26 DIAGNOSIS — K2950 Unspecified chronic gastritis without bleeding: Secondary | ICD-10-CM | POA: Diagnosis not present

## 2015-07-26 DIAGNOSIS — Z833 Family history of diabetes mellitus: Secondary | ICD-10-CM | POA: Diagnosis not present

## 2015-07-26 DIAGNOSIS — Z79899 Other long term (current) drug therapy: Secondary | ICD-10-CM | POA: Insufficient documentation

## 2015-07-26 DIAGNOSIS — K219 Gastro-esophageal reflux disease without esophagitis: Secondary | ICD-10-CM | POA: Diagnosis not present

## 2015-07-26 DIAGNOSIS — R12 Heartburn: Secondary | ICD-10-CM | POA: Diagnosis present

## 2015-07-26 DIAGNOSIS — E7800 Pure hypercholesterolemia, unspecified: Secondary | ICD-10-CM | POA: Diagnosis not present

## 2015-07-26 DIAGNOSIS — Z7982 Long term (current) use of aspirin: Secondary | ICD-10-CM | POA: Diagnosis not present

## 2015-07-26 HISTORY — DX: Other specified postprocedural states: Z98.890

## 2015-07-26 HISTORY — PX: ESOPHAGOGASTRODUODENOSCOPY (EGD) WITH PROPOFOL: SHX5813

## 2015-07-26 HISTORY — DX: Other specified postprocedural states: R11.2

## 2015-07-26 SURGERY — ESOPHAGOGASTRODUODENOSCOPY (EGD) WITH PROPOFOL
Anesthesia: Monitor Anesthesia Care | Wound class: Clean Contaminated

## 2015-07-26 MED ORDER — LIDOCAINE HCL (CARDIAC) 20 MG/ML IV SOLN
INTRAVENOUS | Status: DC | PRN
  Administered 2015-07-26: 50 mg via INTRAVENOUS

## 2015-07-26 MED ORDER — LACTATED RINGERS IV SOLN
INTRAVENOUS | Status: DC
  Administered 2015-07-26: 10:00:00 via INTRAVENOUS

## 2015-07-26 MED ORDER — PROPOFOL 10 MG/ML IV BOLUS
INTRAVENOUS | Status: DC | PRN
Start: 2015-07-26 — End: 2015-07-26
  Administered 2015-07-26: 30 mg via INTRAVENOUS
  Administered 2015-07-26: 120 mg via INTRAVENOUS
  Administered 2015-07-26: 50 mg via INTRAVENOUS

## 2015-07-26 MED ORDER — STERILE WATER FOR IRRIGATION IR SOLN
Status: DC | PRN
  Administered 2015-07-26: 10:00:00

## 2015-07-26 MED ORDER — GLYCOPYRROLATE 0.2 MG/ML IJ SOLN
INTRAMUSCULAR | Status: DC | PRN
  Administered 2015-07-26: 0.1 mg via INTRAVENOUS

## 2015-07-26 SURGICAL SUPPLY — 31 items
BALLN DILATOR 10-12 8 (BALLOONS)
BALLN DILATOR 12-15 8 (BALLOONS)
BALLN DILATOR 15-18 8 (BALLOONS)
BALLN DILATOR CRE 0-12 8 (BALLOONS)
BALLN DILATOR ESOPH 8 10 CRE (MISCELLANEOUS) IMPLANT
BALLOON DILATOR 12-15 8 (BALLOONS) IMPLANT
BALLOON DILATOR 15-18 8 (BALLOONS) IMPLANT
BALLOON DILATOR CRE 0-12 8 (BALLOONS) IMPLANT
BLOCK BITE 60FR ADLT L/F GRN (MISCELLANEOUS) ×2 IMPLANT
CANISTER SUCT 1200ML W/VALVE (MISCELLANEOUS) ×2 IMPLANT
CLIP HMST 235XBRD CATH ROT (MISCELLANEOUS) IMPLANT
CLIP RESOLUTION 360 11X235 (MISCELLANEOUS)
FCP ESCP3.2XJMB 240X2.8X (MISCELLANEOUS)
FORCEPS BIOP RAD 4 LRG CAP 4 (CUTTING FORCEPS) ×2 IMPLANT
FORCEPS BIOP RJ4 240 W/NDL (MISCELLANEOUS)
FORCEPS ESCP3.2XJMB 240X2.8X (MISCELLANEOUS) IMPLANT
GOWN CVR UNV OPN BCK APRN NK (MISCELLANEOUS) ×2 IMPLANT
GOWN ISOL THUMB LOOP REG UNIV (MISCELLANEOUS) ×2
INJECTOR VARIJECT VIN23 (MISCELLANEOUS) IMPLANT
KIT DEFENDO VALVE AND CONN (KITS) IMPLANT
KIT ENDO PROCEDURE OLY (KITS) ×2 IMPLANT
MARKER SPOT ENDO TATTOO 5ML (MISCELLANEOUS) IMPLANT
PAD GROUND ADULT SPLIT (MISCELLANEOUS) IMPLANT
SNARE SHORT THROW 13M SML OVAL (MISCELLANEOUS) IMPLANT
SNARE SHORT THROW 30M LRG OVAL (MISCELLANEOUS) IMPLANT
SPOT EX ENDOSCOPIC TATTOO (MISCELLANEOUS)
SYR INFLATION 60ML (SYRINGE) IMPLANT
VARIJECT INJECTOR VIN23 (MISCELLANEOUS)
WATER STERILE IRR 250ML POUR (IV SOLUTION) ×2 IMPLANT
WIDE-EYE POLYPTRAP (MISCELLANEOUS) IMPLANT
WIRE CRE 18-20MM 8CM F G (MISCELLANEOUS) IMPLANT

## 2015-07-26 NOTE — Transfer of Care (Signed)
Immediate Anesthesia Transfer of Care Note  Patient: Judy Munoz  Procedure(s) Performed: Procedure(s): ESOPHAGOGASTRODUODENOSCOPY (EGD) WITH PROPOFOL (N/A)  Patient Location: PACU  Anesthesia Type: MAC  Level of Consciousness: awake, alert  and patient cooperative  Airway and Oxygen Therapy: Patient Spontanous Breathing and Patient connected to supplemental oxygen  Post-op Assessment: Post-op Vital signs reviewed, Patient's Cardiovascular Status Stable, Respiratory Function Stable, Patent Airway and No signs of Nausea or vomiting  Post-op Vital Signs: Reviewed and stable  Complications: No apparent anesthesia complications

## 2015-07-26 NOTE — Anesthesia Procedure Notes (Signed)
Procedure Name: MAC Date/Time: 07/26/2015 10:24 AM Performed by: Cameron Ali Pre-anesthesia Checklist: Patient identified, Emergency Drugs available, Suction available, Timeout performed and Patient being monitored Patient Re-evaluated:Patient Re-evaluated prior to inductionOxygen Delivery Method: Nasal cannula Placement Confirmation: positive ETCO2

## 2015-07-26 NOTE — Anesthesia Postprocedure Evaluation (Signed)
Anesthesia Post Note  Patient: Judy Munoz  Procedure(s) Performed: Procedure(s) (LRB): ESOPHAGOGASTRODUODENOSCOPY (EGD) WITH PROPOFOL (N/A)  Patient location during evaluation: PACU Anesthesia Type: MAC Level of consciousness: awake and alert and oriented Pain management: satisfactory to patient Vital Signs Assessment: post-procedure vital signs reviewed and stable Respiratory status: spontaneous breathing, nonlabored ventilation and respiratory function stable Cardiovascular status: blood pressure returned to baseline and stable Postop Assessment: Adequate PO intake and No signs of nausea or vomiting Anesthetic complications: no    Raliegh Ip

## 2015-07-26 NOTE — Anesthesia Preprocedure Evaluation (Signed)
Anesthesia Evaluation  Patient identified by MRN, date of birth, ID band  Reviewed: Allergy & Precautions, H&P , NPO status , Patient's Chart, lab work & pertinent test results  Airway Mallampati: II  TM Distance: >3 FB Neck ROM: full    Dental no notable dental hx.    Pulmonary    Pulmonary exam normal        Cardiovascular  Rhythm:regular Rate:Normal     Neuro/Psych    GI/Hepatic GERD  ,  Endo/Other    Renal/GU      Musculoskeletal   Abdominal   Peds  Hematology   Anesthesia Other Findings   Reproductive/Obstetrics                             Anesthesia Physical Anesthesia Plan  ASA: II  Anesthesia Plan: MAC   Post-op Pain Management:    Induction:   Airway Management Planned:   Additional Equipment:   Intra-op Plan:   Post-operative Plan:   Informed Consent: I have reviewed the patients History and Physical, chart, labs and discussed the procedure including the risks, benefits and alternatives for the proposed anesthesia with the patient or authorized representative who has indicated his/her understanding and acceptance.     Plan Discussed with: CRNA  Anesthesia Plan Comments:         Anesthesia Quick Evaluation

## 2015-07-26 NOTE — Op Note (Signed)
[facility] Patient Name: Judy Munoz Procedure Date: 07/26/2015 10:12 AM MRN: [id number] Account #: [id number] Date of Birth: 06-11-1970 Admit Type: Outpatient Age: 45 Room: [facility] OR ROOM 01 Gender: Female Note Status: Finalized Procedure:            Upper GI endoscopy Indications:          Heartburn Providers:            Lucilla Lame, MD Referring MD:         Arnetha Courser (Referring MD) Medicines:            Propofol per Anesthesia Complications:        No immediate complications. Procedure:            Pre-Anesthesia Assessment:                       - Prior to the procedure, a History and Physical was                        performed, and patient medications and allergies were                        reviewed. The patient's tolerance of previous                        anesthesia was also reviewed. The risks and benefits of                        the procedure and the sedation options and risks were                        discussed with the patient. All questions were                        answered, and informed consent was obtained. Prior                        Anticoagulants: The patient has taken no previous                        anticoagulant or antiplatelet agents. ASA Grade                        Assessment: II - A patient with mild systemic disease.                        After reviewing the risks and benefits, the patient was                        deemed in satisfactory condition to undergo the                        procedure.                       After obtaining informed consent, the endoscope was                        passed under direct vision. Throughout the procedure,  the patient's blood pressure, pulse, and oxygen                        saturations were monitored continuously. The was                        introduced through the mouth, and advanced to the                        second part of  duodenum. The upper GI endoscopy was                        accomplished without difficulty. The patient tolerated                        the procedure well. Findings:      The examined esophagus was normal.      Localized minimal inflammation characterized by erythema was found in       the gastric antrum. Biopsies were taken with a cold forceps for       histology.      The examined duodenum was normal. Impression:           - Normal esophagus.                       - Gastritis. Biopsied.                       - Normal examined duodenum. Recommendation:       - Await pathology results.                       - Use Protonix (pantoprazole) 40 mg PO BID. Procedure Code(s):    --- Professional ---                       [id number], Esophagogastroduodenoscopy, flexible, transoral;                        with biopsy, single or multiple Diagnosis Code(s):    --- Professional ---                       R12, Heartburn                       K29.70, Gastritis, unspecified, without bleeding CPT copyright 2016 American Medical Association. All rights reserved. The codes documented in this report are preliminary and upon coder review may  be revised to meet current compliance requirements. Lucilla Lame, MD 07/26/2015 10:33:06 AM This report has been signed electronically. Number of Addenda: 0 Note Initiated On: 07/26/2015 10:12 AM      [facility]

## 2015-07-26 NOTE — H&P (Signed)
[facility]  [address] [place], [place] [address] Phone: [phone] Fax : [phone]  Primary Care Physician:  Enid Derry, MD Primary Gastroenterologist:  Dr. Allen Norris  Pre-Procedure History & Physical: HPI:  Judy Munoz is a 45 y.o. female is here for an endoscopy.   Past Medical History  Diagnosis Date  . Elevated glucose   . History of uterine fibroid   . Vitamin D deficiency   . GERD (gastroesophageal reflux disease)   . High cholesterol   . IFG (impaired fasting glucose)   . Heart murmur   . PONV (postoperative nausea and vomiting)     Past Surgical History  Procedure Laterality Date  . Esophagogastroduodenoscopy      Prior to Admission medications   Medication Sig Start Date End Date Taking? Authorizing Provider  aspirin EC 81 MG tablet Take 1 tablet (81 mg total) by mouth daily. 03/18/15  Yes Arnetha Courser, MD  cetirizine (ZYRTEC) 10 MG tablet Take 10 mg by mouth daily.   Yes Historical Provider, MD  cholecalciferol (VITAMIN D) 1000 UNITS tablet Take 1,000 Units by mouth daily.   Yes Historical Provider, MD  cyclobenzaprine (FLEXERIL) 10 MG tablet Take 1 tablet (10 mg total) by mouth 3 (three) times daily as needed for muscle spasms. Do not drive for 8 hours after taking 03/18/15  Yes Arnetha Courser, MD  pantoprazole (PROTONIX) 40 MG tablet Take 1 tablet (40 mg total) by mouth daily. 07/05/15  Yes Lucilla Lame, MD  pravastatin (PRAVACHOL) 20 MG tablet Take 1 tablet (20 mg total) by mouth at bedtime. 06/04/15  Yes Arnetha Courser, MD    Allergies as of 07/15/2015 - Review Complete 03/19/2015  Allergen Reaction Noted  . Sulfa antibiotics  03/02/2015    Family History  Problem Relation Age of Onset  . Diabetes Father   . Heart disease Father   . Hypertension Father   . Hyperlipidemia Father   . Cancer Maternal Grandmother     uterine  . Breast cancer Maternal Grandmother 38    Social History   Social History  . Marital Status:  Married    Spouse Name: N/A  . Number of Children: N/A  . Years of Education: N/A   Occupational History  . Not on file.   Social History Main Topics  . Smoking status: Never Smoker   . Smokeless tobacco: Never Used  . Alcohol Use: No  . Drug Use: No  . Sexual Activity: Yes    Birth Control/ Protection: None   Other Topics Concern  . Not on file   Social History Narrative    Review of Systems: See HPI, otherwise negative ROS  Physical Exam: BP 120/79 mmHg  Pulse 83  Temp(Src) 97.9 F (36.6 C)  Resp 16  Ht 5' 3"  (1.6 m)  Wt 144 lb 11.2 oz (65.635 kg)  BMI 25.64 kg/m2  SpO2 100%  LMP 07/05/2015 (Exact Date) General:   Alert,  pleasant and cooperative in NAD Head:  Normocephalic and atraumatic. Neck:  Supple; no masses or thyromegaly. Lungs:  Clear throughout to auscultation.    Heart:  Regular rate and rhythm. Abdomen:  Soft, nontender and nondistended. Normal bowel sounds, without guarding, and without rebound.   Neurologic:  Alert and  oriented x4;  grossly normal neurologically.  Impression/Plan: Judy Munoz is here for an endoscopy to be performed for GERd  Risks, benefits, limitations, and alternatives regarding  endoscopy have been reviewed with the patient.  Questions have  been answered.  All parties agreeable.   Ollen Bowl, MD  07/26/2015, 10:11 AM

## 2015-07-27 ENCOUNTER — Other Ambulatory Visit: Payer: Self-pay

## 2015-07-27 ENCOUNTER — Telehealth: Payer: Self-pay | Admitting: Gastroenterology

## 2015-07-27 ENCOUNTER — Encounter: Payer: Self-pay | Admitting: Gastroenterology

## 2015-07-27 DIAGNOSIS — K219 Gastro-esophageal reflux disease without esophagitis: Secondary | ICD-10-CM

## 2015-07-27 DIAGNOSIS — K2100 Gastro-esophageal reflux disease with esophagitis, without bleeding: Secondary | ICD-10-CM

## 2015-07-27 MED ORDER — PANTOPRAZOLE SODIUM 40 MG PO TBEC: 40.0000 mg | DELAYED_RELEASE_TABLET | Freq: Two times a day (BID) | ORAL | Status: DC

## 2015-07-27 NOTE — Telephone Encounter (Signed)
Notified pt I have sent in new Rx for Pantoprazole 25m BID. Advised pt her insurance may send me a request for a prior authorization. So she may want to call pharmacy prior to going and pick this up.

## 2015-07-27 NOTE — Telephone Encounter (Signed)
Patient needs a new RX written for her Pantoprazole. She was taking it 1x a day and she stated Dr. Allen Norris told her to take it 2x's a day.

## 2015-07-30 ENCOUNTER — Encounter: Payer: Self-pay | Admitting: Gastroenterology

## 2015-08-03 ENCOUNTER — Encounter: Payer: Self-pay | Admitting: Gastroenterology

## 2015-08-06 NOTE — Telephone Encounter (Signed)
Pt scheduled for a follow up appt on Monday, April 10th.

## 2015-08-06 NOTE — Telephone Encounter (Signed)
-----   Message from Lucilla Lame, MD sent at 08/03/2015  8:39 AM EDT ----- Please have the patient come in for a follow up to discuss the pathology

## 2015-08-09 ENCOUNTER — Other Ambulatory Visit
Admission: RE | Admit: 2015-08-09 | Discharge: 2015-08-09 | Disposition: A | Discharge status: Home / Self Care | Payer: BLUE CROSS/BLUE SHIELD | Source: Ambulatory Visit | Attending: Gastroenterology | Admitting: Gastroenterology

## 2015-08-09 ENCOUNTER — Ambulatory Visit (INDEPENDENT_AMBULATORY_CARE_PROVIDER_SITE_OTHER): Payer: BLUE CROSS/BLUE SHIELD | Admitting: Gastroenterology

## 2015-08-09 ENCOUNTER — Encounter: Payer: Self-pay | Admitting: Gastroenterology

## 2015-08-09 VITALS — BP 128/79 | HR 67 | Temp 98.2°F | Ht 64.0 in | Wt 146.0 lb

## 2015-08-09 DIAGNOSIS — K31A0 Gastric intestinal metaplasia, unspecified: Secondary | ICD-10-CM

## 2015-08-09 DIAGNOSIS — K219 Gastro-esophageal reflux disease without esophagitis: Secondary | ICD-10-CM

## 2015-08-09 DIAGNOSIS — K3189 Other diseases of stomach and duodenum: Secondary | ICD-10-CM

## 2015-08-09 NOTE — Progress Notes (Signed)
   Primary Care Physician: Enid Derry, MD  Primary Gastroenterologist:  Dr. Lucilla Lame  Chief Complaint  Patient presents with  . Follow-up    Go over Results    HPI: Judy Munoz is a 45 y.o. female here For follow-up after having an EGD. The patient was having EGD for heartburn. The patient has been on Protonix twice a day and states that her symptoms are much better. The patient's biopsies of her stomach showed inactive gastritis with intestinal metaplasia.  Current Outpatient Prescriptions  Medication Sig Dispense Refill  . cetirizine (ZYRTEC) 10 MG tablet Take 10 mg by mouth daily.    . cholecalciferol (VITAMIN D) 1000 UNITS tablet Take 1,000 Units by mouth daily.    . pantoprazole (PROTONIX) 40 MG tablet TAKE 1 TABLET BY MOUTH EVERY DAY 30 tablet 4  . pravastatin (PRAVACHOL) 20 MG tablet Take 1 tablet (20 mg total) by mouth at bedtime. 30 tablet 5   No current facility-administered medications for this visit.    Allergies as of 08/09/2015 - Review Complete 08/09/2015  Allergen Reaction Noted  . Sulfa antibiotics Hives 03/02/2015    ROS:  General: Negative for anorexia, weight loss, fever, chills, fatigue, weakness. ENT: Negative for hoarseness, difficulty swallowing , nasal congestion. CV: Negative for chest pain, angina, palpitations, dyspnea on exertion, peripheral edema.  Respiratory: Negative for dyspnea at rest, dyspnea on exertion, cough, sputum, wheezing.  GI: See history of present illness. GU:  Negative for dysuria, hematuria, urinary incontinence, urinary frequency, nocturnal urination.  Endo: Negative for unusual weight change.    Physical Examination:   BP 128/79 mmHg  Pulse 67  Temp(Src) 98.2 F (36.8 C) (Oral)  Ht 5' 4"  (1.626 m)  Wt 146 lb (66.225 kg)  BMI 25.05 kg/m2  LMP 07/05/2015 (Exact Date)  General: Well-nourished, well-developed in no acute distress.  Eyes: No icterus. Conjunctivae pink. Neuro: Alert and oriented x 3.  Grossly  intact. Skin: Warm and dry, no jaundice.   Psych: Alert and cooperative, normal mood and affect.  Labs:    Imaging Studies: No results found.  Assessment and Plan:   Judy Munoz is a 45 y.o. y/o female who was found to have intestinal metaplasia in her stomach with inactive gastritis. The patient has been told the slight increased risk of gastric cancer with gastric intestinal metaplasia. The patient will continue her present medication because it is helping her heartburn. The patient will also be set up for repeat EGD in 3 years. The patient has been explained the plan and agrees with it.   Note: This dictation was prepared with Dragon dictation along with smaller phrase technology. Any transcriptional errors that result from this process are unintentional.

## 2015-08-10 LAB — MISC LABCORP TEST (SEND OUT): LABCORP TEST CODE: 163170

## 2015-08-12 ENCOUNTER — Telehealth: Payer: Self-pay

## 2015-08-12 NOTE — Telephone Encounter (Signed)
Pt notified of results

## 2015-08-12 NOTE — Telephone Encounter (Signed)
-----   Message from Lucilla Lame, MD sent at 08/11/2015  7:18 AM EDT ----- Let the patient know the H. Pylori was negative.

## 2015-08-16 NOTE — Telephone Encounter (Signed)
ERROR

## 2015-11-29 ENCOUNTER — Other Ambulatory Visit: Payer: Self-pay

## 2015-11-30 ENCOUNTER — Other Ambulatory Visit: Payer: Self-pay

## 2015-11-30 NOTE — Telephone Encounter (Signed)
Please ask pt to request this from GI doctor I'm really trying to get people off of this unless they have a real GI need and I'm asking that to be determined and prescribed by specialist if needed

## 2015-11-30 NOTE — Telephone Encounter (Signed)
She actually already gets from GI and does not need refills.

## 2015-12-01 MED ORDER — PRAVASTATIN SODIUM 20 MG PO TABS: 20.0000 mg | ORAL_TABLET | Freq: Every day | ORAL | 0 refills | Status: DC

## 2015-12-13 ENCOUNTER — Ambulatory Visit (INDEPENDENT_AMBULATORY_CARE_PROVIDER_SITE_OTHER): Payer: BLUE CROSS/BLUE SHIELD | Admitting: Family Medicine

## 2015-12-13 ENCOUNTER — Encounter: Payer: Self-pay | Admitting: Family Medicine

## 2015-12-13 VITALS — BP 119/80 | HR 69 | Temp 98.2°F | Ht 63.0 in | Wt 144.0 lb

## 2015-12-13 DIAGNOSIS — J069 Acute upper respiratory infection, unspecified: Secondary | ICD-10-CM

## 2015-12-13 DIAGNOSIS — E7800 Pure hypercholesterolemia, unspecified: Secondary | ICD-10-CM

## 2015-12-13 DIAGNOSIS — R7301 Impaired fasting glucose: Secondary | ICD-10-CM

## 2015-12-13 DIAGNOSIS — E559 Vitamin D deficiency, unspecified: Secondary | ICD-10-CM | POA: Diagnosis not present

## 2015-12-13 LAB — LIPID PANEL PICCOLO, WAIVED
CHOL/HDL RATIO PICCOLO,WAIVE: 2.8 mg/dL
Cholesterol Piccolo, Waived: 160 mg/dL (ref <200)
HDL Chol Piccolo, Waived: 57 mg/dL — ABNORMAL LOW (ref >59)
LDL CHOL CALC PICCOLO WAIVED: 77 mg/dL (ref <100)
Triglycerides Piccolo,Waived: 131 mg/dL (ref <150)
VLDL CHOL CALC PICCOLO,WAIVE: 26 mg/dL (ref <30)

## 2015-12-13 LAB — BAYER DCA HB A1C WAIVED: HB A1C (BAYER DCA - WAIVED): 5.8 % (ref <7.0)

## 2015-12-13 MED ORDER — PREDNISONE 50 MG PO TABS: ORAL_TABLET | ORAL | 0 refills | Status: DC

## 2015-12-13 MED ORDER — PRAVASTATIN SODIUM 20 MG PO TABS: 20.0000 mg | ORAL_TABLET | Freq: Every day | ORAL | 1 refills | Status: DC

## 2015-12-13 NOTE — Assessment & Plan Note (Signed)
Stable. Continue diet and exercise. Continue to monitor. Recheck 6 months.

## 2015-12-13 NOTE — Assessment & Plan Note (Signed)
Well controlled on the pravastatin. Tolerating it well. Continue current regimen. Continue to monitor. Recheck 6 months at physical.

## 2015-12-13 NOTE — Progress Notes (Signed)
BP 119/80 (BP Location: Left Arm, Patient Position: Sitting, Cuff Size: Normal)   Pulse 69   Temp 98.2 F (36.8 C)   Ht 5' 3"  (1.6 m)   Wt 144 lb (65.3 kg)   LMP 11/23/2015 (Exact Date)   SpO2 100%   BMI 25.51 kg/m    Subjective:    Patient ID: Judy Munoz, female    DOB: 03-29-1971, 45 y.o.   MRN: [id number]  HPI: Judy Munoz is a 45 y.o. female  Chief Complaint  Patient presents with  . Hyperlipidemia  . URI    X 3 days, a lot of sinus pressure in her face, has been taking tylenol sinus    HYPERLIPIDEMIA Hyperlipidemia status: Under good control Satisfied with current treatment?  yes Side effects:  no Medication compliance: excellent compliance Past cholesterol meds: pravastatin Supplements: none Aspirin:  no Chest pain:  no Coronary artery disease:  no Family history CAD:  yes  Impaired Fasting Glucose HbA1C:  Lab Results  Component Value Date   HGBA1C 5.9 (H) 06/03/2015   Duration of elevated blood sugar: years Polydipsia: no Polyuria: no Weight change: no Visual disturbance: no Glucose Monitoring: no Diabetic Education: Not Completed Family history of diabetes: yes  UPPER RESPIRATORY TRACT INFECTION Duration: about 3-4 days Worst symptom: sore throat and ear pain, congestion Fever: no Cough: no Shortness of breath: no Wheezing: no Chest pain: no Chest tightness: no Chest congestion: no Nasal congestion: yes Runny nose: yes Post nasal drip: yes Sneezing: no Sore throat: yes Swollen glands: no Sinus pressure: yes Headache: yes Face pain: no Toothache: no Ear pain: yes left Ear pressure: yes left Eyes red/itching:no Eye drainage/crusting: no  Vomiting: no Rash: no Fatigue: yes Sick contacts: yes Strep contacts: no  Context: worse Recurrent sinusitis: no Relief with OTC cold/cough medications: yes  Treatments attempted: cold/sinus and pseudoephedrine    Active Ambulatory Problems    Diagnosis Date Noted  . GERD  (gastroesophageal reflux disease)   . High cholesterol   . Vitamin D deficiency   . IFG (impaired fasting glucose)   . Pressure in chest 03/18/2015  . Trapezius muscle strain 03/18/2015  . Overweight (BMI 25.0-29.9) 03/23/2015  . Medication monitoring encounter 06/03/2015  . Gastritis    Resolved Ambulatory Problems    Diagnosis Date Noted  . No Resolved Ambulatory Problems   Past Medical History:  Diagnosis Date  . Elevated glucose   . GERD (gastroesophageal reflux disease)   . Heart murmur   . High cholesterol   . History of uterine fibroid   . IFG (impaired fasting glucose)   . PONV (postoperative nausea and vomiting)   . Vitamin D deficiency    Allergies  Allergen Reactions  . Sulfa Antibiotics Hives   Past Surgical History:  Procedure Laterality Date  . ESOPHAGOGASTRODUODENOSCOPY    . ESOPHAGOGASTRODUODENOSCOPY (EGD) WITH PROPOFOL N/A 07/26/2015   Procedure: ESOPHAGOGASTRODUODENOSCOPY (EGD) WITH PROPOFOL;  Surgeon: Lucilla Lame, MD;  Location: [facility];  Service: Endoscopy;  Laterality: N/A;   Social History   Social History  . Marital status: Married    Spouse name: N/A  . Number of children: N/A  . Years of education: N/A   Social History Main Topics  . Smoking status: Never Smoker  . Smokeless tobacco: Never Used  . Alcohol use No  . Drug use: No  . Sexual activity: Yes    Birth control/ protection: None   Other Topics Concern  . None   Social History Narrative  .  None   Family History  Problem Relation Age of Onset  . Diabetes Father   . Heart disease Father   . Hypertension Father   . Hyperlipidemia Father   . Cancer Maternal Grandmother     uterine  . Breast cancer Maternal Grandmother 50   Review of Systems  Constitutional: Negative.   HENT: Positive for congestion, ear pain, postnasal drip, rhinorrhea, sinus pressure and sore throat. Negative for dental problem, drooling, ear discharge, facial swelling, hearing loss, mouth  sores, nosebleeds, sneezing, tinnitus, trouble swallowing and voice change.   Respiratory: Negative.   Cardiovascular: Negative.   Gastrointestinal: Negative.   Musculoskeletal: Negative.   Psychiatric/Behavioral: Negative.     Per HPI unless specifically indicated above     Objective:    BP 119/80 (BP Location: Left Arm, Patient Position: Sitting, Cuff Size: Normal)   Pulse 69   Temp 98.2 F (36.8 C)   Ht 5' 3"  (1.6 m)   Wt 144 lb (65.3 kg)   LMP 11/23/2015 (Exact Date)   SpO2 100%   BMI 25.51 kg/m   Wt Readings from Last 3 Encounters:  12/13/15 144 lb (65.3 kg)  08/09/15 146 lb (66.2 kg)  07/26/15 144 lb 11.2 oz (65.6 kg)    Physical Exam  Constitutional: She is oriented to person, place, and time. She appears well-developed and well-nourished. No distress.  HENT:  Head: Normocephalic and atraumatic.  Right Ear: Hearing, tympanic membrane, external ear and ear canal normal.  Left Ear: Hearing, tympanic membrane, external ear and ear canal normal.  Nose: Mucosal edema and rhinorrhea present.  Mouth/Throat: Uvula is midline, oropharynx is clear and moist and mucous membranes are normal. No oropharyngeal exudate.  Eyes: Conjunctivae, EOM and lids are normal. Pupils are equal, round, and reactive to light. Right eye exhibits no discharge. Left eye exhibits no discharge. No scleral icterus.  Neck: Normal range of motion. Neck supple. No JVD present. No tracheal deviation present. No thyromegaly present.  Cardiovascular: Normal rate, regular rhythm, normal heart sounds and intact distal pulses.  Exam reveals no gallop and no friction rub.   No murmur heard. Pulmonary/Chest: Effort normal and breath sounds normal. No stridor. No respiratory distress. She has no wheezes. She has no rales. She exhibits no tenderness.  Musculoskeletal: Normal range of motion.  Lymphadenopathy:    She has cervical adenopathy.  Neurological: She is alert and oriented to person, place, and time.    Skin: Skin is warm, dry and intact. No rash noted. She is not diaphoretic. No erythema. No pallor.  Psychiatric: She has a normal mood and affect. Her speech is normal and behavior is normal. Judgment and thought content normal. Cognition and memory are normal.  Nursing note and vitals reviewed.   Results for orders placed or performed during the hospital encounter of 08/09/15  Miscellaneous [facility] test (send-out)  Result Value Ref Range   [facility] test code 163,170    [facility] test name H PYLORI IGA    Misc [facility] result COMMENT       Assessment & Plan:   Problem List Items Addressed This Visit      Endocrine   IFG (impaired fasting glucose) - Primary    Stable. Continue diet and exercise. Continue to monitor. Recheck 6 months.       Relevant Orders   Bayer DCA Hb A1c Waived   Comprehensive metabolic panel     Other   High cholesterol    Well controlled on the pravastatin. Tolerating it  well. Continue current regimen. Continue to monitor. Recheck 6 months at physical.      Relevant Medications   pravastatin (PRAVACHOL) 20 MG tablet   Other Relevant Orders   Comprehensive metabolic panel   Lipid Panel Piccolo, Waived   Vitamin D deficiency    Rechecking labs today. Will replace as needed.       Relevant Orders   Comprehensive metabolic panel   VITAMIN D 25 Hydroxy (Vit-D Deficiency, Fractures)    Other Visit Diagnoses    Upper respiratory infection       Will help congestion with prednisone x 3 days. Call with concerns or if not getting better or getting worse.        Follow up plan: Return in about 6 months (around 06/14/2016) for Physical.

## 2015-12-13 NOTE — Assessment & Plan Note (Signed)
Rechecking labs today. Will replace as needed.

## 2015-12-14 ENCOUNTER — Encounter: Payer: Self-pay | Admitting: Family Medicine

## 2015-12-14 LAB — COMPREHENSIVE METABOLIC PANEL WITH GFR
ALT: 13 IU/L (ref 0–32)
AST: 16 IU/L (ref 0–40)
Albumin/Globulin Ratio: 2 (ref 1.2–2.2)
Albumin: 4.5 g/dL (ref 3.5–5.5)
Alkaline Phosphatase: 48 IU/L (ref 39–117)
BUN/Creatinine Ratio: 11 (ref 9–23)
BUN: 9 mg/dL (ref 6–24)
Bilirubin Total: 0.3 mg/dL (ref 0.0–1.2)
CO2: 22 mmol/L (ref 18–29)
Calcium: 9.3 mg/dL (ref 8.7–10.2)
Chloride: 103 mmol/L (ref 96–106)
Creatinine, Ser: 0.79 mg/dL (ref 0.57–1.00)
GFR calc Af Amer: 105 mL/min/1.73
GFR calc non Af Amer: 91 mL/min/1.73
Globulin, Total: 2.2 g/dL (ref 1.5–4.5)
Glucose: 94 mg/dL (ref 65–99)
Potassium: 4.4 mmol/L (ref 3.5–5.2)
Sodium: 142 mmol/L (ref 134–144)
Total Protein: 6.7 g/dL (ref 6.0–8.5)

## 2015-12-14 LAB — VITAMIN D 25 HYDROXY (VIT D DEFICIENCY, FRACTURES): Vit D, 25-Hydroxy: 40.7 ng/mL (ref 30.0–100.0)

## 2016-01-27 ENCOUNTER — Ambulatory Visit (INDEPENDENT_AMBULATORY_CARE_PROVIDER_SITE_OTHER): Payer: BLUE CROSS/BLUE SHIELD

## 2016-01-27 DIAGNOSIS — Z23 Encounter for immunization: Secondary | ICD-10-CM | POA: Diagnosis not present

## 2016-02-14 ENCOUNTER — Other Ambulatory Visit: Payer: Self-pay | Admitting: Family Medicine

## 2016-02-14 DIAGNOSIS — Z1231 Encounter for screening mammogram for malignant neoplasm of breast: Secondary | ICD-10-CM

## 2016-03-07 ENCOUNTER — Ambulatory Visit (INDEPENDENT_AMBULATORY_CARE_PROVIDER_SITE_OTHER): Payer: BLUE CROSS/BLUE SHIELD | Admitting: Obstetrics and Gynecology

## 2016-03-07 ENCOUNTER — Encounter: Payer: Self-pay | Admitting: Obstetrics and Gynecology

## 2016-03-07 VITALS — BP 136/69 | HR 73 | Ht 63.0 in | Wt 145.4 lb

## 2016-03-07 DIAGNOSIS — Z01419 Encounter for gynecological examination (general) (routine) without abnormal findings: Secondary | ICD-10-CM

## 2016-03-07 DIAGNOSIS — M545 Low back pain, unspecified: Secondary | ICD-10-CM

## 2016-03-07 DIAGNOSIS — Z1239 Encounter for other screening for malignant neoplasm of breast: Secondary | ICD-10-CM

## 2016-03-07 DIAGNOSIS — Z1231 Encounter for screening mammogram for malignant neoplasm of breast: Secondary | ICD-10-CM

## 2016-03-07 DIAGNOSIS — N939 Abnormal uterine and vaginal bleeding, unspecified: Secondary | ICD-10-CM

## 2016-03-07 LAB — POCT URINE PREGNANCY: PREG TEST UR: NEGATIVE

## 2016-03-07 NOTE — Patient Instructions (Signed)
Preventive Care for Adults, Female A healthy lifestyle and preventive care can promote health and wellness. Preventive health guidelines for women include the following key practices.  A routine yearly physical is a good way to check with your health care provider about your health and preventive screening. It is a chance to share any concerns and updates on your health and to receive a thorough exam.  Visit your dentist for a routine exam and preventive care every 6 months. Brush your teeth twice a day and floss once a day. Good oral hygiene prevents tooth decay and gum disease.  The frequency of eye exams is based on your age, health, family medical history, use of contact lenses, and other factors. Follow your health care provider's recommendations for frequency of eye exams.  Eat a healthy diet. Foods like vegetables, fruits, whole grains, low-fat dairy products, and lean protein foods contain the nutrients you need without too many calories. Decrease your intake of foods high in solid fats, added sugars, and salt. Eat the right amount of calories for you.Get information about a proper diet from your health care provider, if necessary.  Regular physical exercise is one of the most important things you can do for your health. Most adults should get at least 150 minutes of moderate-intensity exercise (any activity that increases your heart rate and causes you to sweat) each week. In addition, most adults need muscle-strengthening exercises on 2 or more days a week.  Maintain a healthy weight. The body mass index (BMI) is a screening tool to identify possible weight problems. It provides an estimate of body fat based on height and weight. Your health care provider can find your BMI and can help you achieve or maintain a healthy weight.For adults 20 years and older:  A BMI below 18.5 is considered underweight.  A BMI of 18.5 to 24.9 is normal.  A BMI of 25 to 29.9 is considered  overweight.  A BMI of 30 and above is considered obese.  Maintain normal blood lipids and cholesterol levels by exercising and minimizing your intake of saturated fat. Eat a balanced diet with plenty of fruit and vegetables. Blood tests for lipids and cholesterol should begin at age 64 and be repeated every 5 years. If your lipid or cholesterol levels are high, you are over 50, or you are at high risk for heart disease, you may need your cholesterol levels checked more frequently.Ongoing high lipid and cholesterol levels should be treated with medicines if diet and exercise are not working.  If you smoke, find out from your health care provider how to quit. If you do not use tobacco, do not start.  Lung cancer screening is recommended for adults aged 52-80 years who are at high risk for developing lung cancer because of a history of smoking. A yearly low-dose CT scan of the lungs is recommended for people who have at least a 30-pack-year history of smoking and are a current smoker or have quit within the past 15 years. A pack year of smoking is smoking an average of 1 pack of cigarettes a day for 1 year (for example: 1 pack a day for 30 years or 2 packs a day for 15 years). Yearly screening should continue until the smoker has stopped smoking for at least 15 years. Yearly screening should be stopped for people who develop a health problem that would prevent them from having lung cancer treatment.  If you are pregnant, do not drink alcohol. If you are  breastfeeding, be very cautious about drinking alcohol. If you are not pregnant and choose to drink alcohol, do not have more than 1 drink per day. One drink is considered to be 12 ounces (355 mL) of beer, 5 ounces (148 mL) of wine, or 1.5 ounces (44 mL) of liquor.  Avoid use of street drugs. Do not share needles with anyone. Ask for help if you need support or instructions about stopping the use of drugs.  High blood pressure causes heart disease and  increases the risk of stroke. Your blood pressure should be checked at least every 1 to 2 years. Ongoing high blood pressure should be treated with medicines if weight loss and exercise do not work.  If you are 25-78 years old, ask your health care provider if you should take aspirin to prevent strokes.  Diabetes screening is done by taking a blood sample to check your blood glucose level after you have not eaten for a certain period of time (fasting). If you are not overweight and you do not have risk factors for diabetes, you should be screened once every 3 years starting at age 86. If you are overweight or obese and you are 3-87 years of age, you should be screened for diabetes every year as part of your cardiovascular risk assessment.  Breast cancer screening is essential preventive care for women. You should practice "breast self-awareness." This means understanding the normal appearance and feel of your breasts and may include breast self-examination. Any changes detected, no matter how small, should be reported to a health care provider. Women in their 66s and 30s should have a clinical breast exam (CBE) by a health care provider as part of a regular health exam every 1 to 3 years. After age 43, women should have a CBE every year. Starting at age 37, women should consider having a mammogram (breast X-ray test) every year. Women who have a family history of breast cancer should talk to their health care provider about genetic screening. Women at a high risk of breast cancer should talk to their health care providers about having an MRI and a mammogram every year.  Breast cancer gene (BRCA)-related cancer risk assessment is recommended for women who have family members with BRCA-related cancers. BRCA-related cancers include breast, ovarian, tubal, and peritoneal cancers. Having family members with these cancers may be associated with an increased risk for harmful changes (mutations) in the breast  cancer genes BRCA1 and BRCA2. Results of the assessment will determine the need for genetic counseling and BRCA1 and BRCA2 testing.  Your health care provider may recommend that you be screened regularly for cancer of the pelvic organs (ovaries, uterus, and vagina). This screening involves a pelvic examination, including checking for microscopic changes to the surface of your cervix (Pap test). You may be encouraged to have this screening done every 3 years, beginning at age 78.  For women ages 79-65, health care providers may recommend pelvic exams and Pap testing every 3 years, or they may recommend the Pap and pelvic exam, combined with testing for human papilloma virus (HPV), every 5 years. Some types of HPV increase your risk of cervical cancer. Testing for HPV may also be done on women of any age with unclear Pap test results.  Other health care providers may not recommend any screening for nonpregnant women who are considered low risk for pelvic cancer and who do not have symptoms. Ask your health care provider if a screening pelvic exam is right for  you.  If you have had past treatment for cervical cancer or a condition that could lead to cancer, you need Pap tests and screening for cancer for at least 20 years after your treatment. If Pap tests have been discontinued, your risk factors (such as having a new sexual partner) need to be reassessed to determine if screening should resume. Some women have medical problems that increase the chance of getting cervical cancer. In these cases, your health care provider may recommend more frequent screening and Pap tests.  Colorectal cancer can be detected and often prevented. Most routine colorectal cancer screening begins at the age of 50 years and continues through age 75 years. However, your health care provider may recommend screening at an earlier age if you have risk factors for colon cancer. On a yearly basis, your health care provider may provide  home test kits to check for hidden blood in the stool. Use of a small camera at the end of a tube, to directly examine the colon (sigmoidoscopy or colonoscopy), can detect the earliest forms of colorectal cancer. Talk to your health care provider about this at age 50, when routine screening begins. Direct exam of the colon should be repeated every 5-10 years through age 75 years, unless early forms of precancerous polyps or small growths are found.  People who are at an increased risk for hepatitis B should be screened for this virus. You are considered at high risk for hepatitis B if:  You were born in a country where hepatitis B occurs often. Talk with your health care provider about which countries are considered high risk.  Your parents were born in a high-risk country and you have not received a shot to protect against hepatitis B (hepatitis B vaccine).  You have HIV or AIDS.  You use needles to inject street drugs.  You live with, or have sex with, someone who has hepatitis B.  You get hemodialysis treatment.  You take certain medicines for conditions like cancer, organ transplantation, and autoimmune conditions.  Hepatitis C blood testing is recommended for all people born from 1945 through 1965 and any individual with known risks for hepatitis C.  Practice safe sex. Use condoms and avoid high-risk sexual practices to reduce the spread of sexually transmitted infections (STIs). STIs include gonorrhea, chlamydia, syphilis, trichomonas, herpes, HPV, and human immunodeficiency virus (HIV). Herpes, HIV, and HPV are viral illnesses that have no cure. They can result in disability, cancer, and death.  You should be screened for sexually transmitted illnesses (STIs) including gonorrhea and chlamydia if:  You are sexually active and are younger than 24 years.  You are older than 24 years and your health care provider tells you that you are at risk for this type of infection.  Your sexual  activity has changed since you were last screened and you are at an increased risk for chlamydia or gonorrhea. Ask your health care provider if you are at risk.  If you are at risk of being infected with HIV, it is recommended that you take a prescription medicine daily to prevent HIV infection. This is called preexposure prophylaxis (PrEP). You are considered at risk if:  You are sexually active and do not regularly use condoms or know the HIV status of your partner(s).  You take drugs by injection.  You are sexually active with a partner who has HIV.  Talk with your health care provider about whether you are at high risk of being infected with HIV. If   you choose to begin PrEP, you should first be tested for HIV. You should then be tested every 3 months for as long as you are taking PrEP.  Osteoporosis is a disease in which the bones lose minerals and strength with aging. This can result in serious bone fractures or breaks. The risk of osteoporosis can be identified using a bone density scan. Women ages 1 years and over and women at risk for fractures or osteoporosis should discuss screening with their health care providers. Ask your health care provider whether you should take a calcium supplement or vitamin D to reduce the rate of osteoporosis.  Menopause can be associated with physical symptoms and risks. Hormone replacement therapy is available to decrease symptoms and risks. You should talk to your health care provider about whether hormone replacement therapy is right for you.  Use sunscreen. Apply sunscreen liberally and repeatedly throughout the day. You should seek shade when your shadow is shorter than you. Protect yourself by wearing long sleeves, pants, a wide-brimmed hat, and sunglasses year round, whenever you are outdoors.  Once a month, do a whole body skin exam, using a mirror to look at the skin on your back. Tell your health care provider of new moles, moles that have irregular  borders, moles that are larger than a pencil eraser, or moles that have changed in shape or color.  Stay current with required vaccines (immunizations).  Influenza vaccine. All adults should be immunized every year.  Tetanus, diphtheria, and acellular pertussis (Td, Tdap) vaccine. Pregnant women should receive 1 dose of Tdap vaccine during each pregnancy. The dose should be obtained regardless of the length of time since the last dose. Immunization is preferred during the 27th-36th week of gestation. An adult who has not previously received Tdap or who does not know her vaccine status should receive 1 dose of Tdap. This initial dose should be followed by tetanus and diphtheria toxoids (Td) booster doses every 10 years. Adults with an unknown or incomplete history of completing a 3-dose immunization series with Td-containing vaccines should begin or complete a primary immunization series including a Tdap dose. Adults should receive a Td booster every 10 years.  Varicella vaccine. An adult without evidence of immunity to varicella should receive 2 doses or a second dose if she has previously received 1 dose. Pregnant females who do not have evidence of immunity should receive the first dose after pregnancy. This first dose should be obtained before leaving the health care facility. The second dose should be obtained 4-8 weeks after the first dose.  Human papillomavirus (HPV) vaccine. Females aged 13-26 years who have not received the vaccine previously should obtain the 3-dose series. The vaccine is not recommended for use in pregnant females. However, pregnancy testing is not needed before receiving a dose. If a female is found to be pregnant after receiving a dose, no treatment is needed. In that case, the remaining doses should be delayed until after the pregnancy. Immunization is recommended for any person with an immunocompromised condition through the age of 24 years if she did not get any or all doses  earlier. During the 3-dose series, the second dose should be obtained 4-8 weeks after the first dose. The third dose should be obtained 24 weeks after the first dose and 16 weeks after the second dose.  Zoster vaccine. One dose is recommended for adults aged 97 years or older unless certain conditions are present.  Measles, mumps, and rubella (MMR) vaccine. Adults born  before 1957 generally are considered immune to measles and mumps. Adults born in [birth year] or later should have 1 or more doses of MMR vaccine unless there is a contraindication to the vaccine or there is laboratory evidence of immunity to each of the three diseases. A routine second dose of MMR vaccine should be obtained at least 28 days after the first dose for students attending postsecondary schools, health care workers, or international travelers. People who received inactivated measles vaccine or an unknown type of measles vaccine during 1963-1967 should receive 2 doses of MMR vaccine. People who received inactivated mumps vaccine or an unknown type of mumps vaccine before 1979 and are at high risk for mumps infection should consider immunization with 2 doses of MMR vaccine. For females of childbearing age, rubella immunity should be determined. If there is no evidence of immunity, females who are not pregnant should be vaccinated. If there is no evidence of immunity, females who are pregnant should delay immunization until after pregnancy. Unvaccinated health care workers born before 60 who lack laboratory evidence of measles, mumps, or rubella immunity or laboratory confirmation of disease should consider measles and mumps immunization with 2 doses of MMR vaccine or rubella immunization with 1 dose of MMR vaccine.  Pneumococcal 13-valent conjugate (PCV13) vaccine. When indicated, a person who is uncertain of his immunization history and has no record of immunization should receive the PCV13 vaccine. All adults 61 years of age and older  should receive this vaccine. An adult aged 92 years or older who has certain medical conditions and has not been previously immunized should receive 1 dose of PCV13 vaccine. This PCV13 should be followed with a dose of pneumococcal polysaccharide (PPSV23) vaccine. Adults who are at high risk for pneumococcal disease should obtain the PPSV23 vaccine at least 8 weeks after the dose of PCV13 vaccine. Adults older than 44 years of age who have normal immune system function should obtain the PPSV23 vaccine dose at least 1 year after the dose of PCV13 vaccine.  Pneumococcal polysaccharide (PPSV23) vaccine. When PCV13 is also indicated, PCV13 should be obtained first. All adults aged 2 years and older should be immunized. An adult younger than age 30 years who has certain medical conditions should be immunized. Any person who resides in a nursing home or long-term care facility should be immunized. An adult smoker should be immunized. People with an immunocompromised condition and certain other conditions should receive both PCV13 and PPSV23 vaccines. People with human immunodeficiency virus (HIV) infection should be immunized as soon as possible after diagnosis. Immunization during chemotherapy or radiation therapy should be avoided. Routine use of PPSV23 vaccine is not recommended for American Indians, [place] Natives, or people younger than 65 years unless there are medical conditions that require PPSV23 vaccine. When indicated, people who have unknown immunization and have no record of immunization should receive PPSV23 vaccine. One-time revaccination 5 years after the first dose of PPSV23 is recommended for people aged 19-64 years who have chronic kidney failure, nephrotic syndrome, asplenia, or immunocompromised conditions. People who received 1-2 doses of PPSV23 before age 44 years should receive another dose of PPSV23 vaccine at age 83 years or later if at least 5 years have passed since the previous dose. Doses  of PPSV23 are not needed for people immunized with PPSV23 at or after age 20 years.  Meningococcal vaccine. Adults with asplenia or persistent complement component deficiencies should receive 2 doses of quadrivalent meningococcal conjugate (MenACWY-D) vaccine. The doses should be obtained  at least 2 months apart. Microbiologists working with certain meningococcal bacteria, [place] recruits, people at risk during an outbreak, and people who travel to or live in countries with a high rate of meningitis should be immunized. A first-year college student up through age 28 years who is living in a residence hall should receive a dose if she did not receive a dose on or after her 16th birthday. Adults who have certain high-risk conditions should receive one or more doses of vaccine.  Hepatitis A vaccine. Adults who wish to be protected from this disease, have certain high-risk conditions, work with hepatitis A-infected animals, work in hepatitis A research labs, or travel to or work in countries with a high rate of hepatitis A should be immunized. Adults who were previously unvaccinated and who anticipate close contact with an international adoptee during the first 60 days after arrival in the [place] from a country with a high rate of hepatitis A should be immunized.  Hepatitis B vaccine. Adults who wish to be protected from this disease, have certain high-risk conditions, may be exposed to blood or other infectious body fluids, are household contacts or sex partners of hepatitis B positive people, are clients or workers in certain care facilities, or travel to or work in countries with a high rate of hepatitis B should be immunized.  Haemophilus influenzae type b (Hib) vaccine. A previously unvaccinated person with asplenia or sickle cell disease or having a scheduled splenectomy should receive 1 dose of Hib vaccine. Regardless of previous immunization, a recipient of a hematopoietic stem cell transplant  should receive a 3-dose series 6-12 months after her successful transplant. Hib vaccine is not recommended for adults with HIV infection. Preventive Services / Frequency Ages 71 to 87 years  Blood pressure check.** / Every 3-5 years.  Lipid and cholesterol check.** / Every 5 years beginning at age 1.  Clinical breast exam.** / Every 3 years for women in their 3s and 31s.  BRCA-related cancer risk assessment.** / For women who have family members with a BRCA-related cancer (breast, ovarian, tubal, or peritoneal cancers).  Pap test.** / Every 2 years from ages 50 through 86. Every 3 years starting at age 87 through age 7 or 75 with a history of 3 consecutive normal Pap tests.  HPV screening.** / Every 3 years from ages 59 through ages 35 to 6 with a history of 3 consecutive normal Pap tests.  Hepatitis C blood test.** / For any individual with known risks for hepatitis C.  Skin self-exam. / Monthly.  Influenza vaccine. / Every year.  Tetanus, diphtheria, and acellular pertussis (Tdap, Td) vaccine.** / Consult your health care provider. Pregnant women should receive 1 dose of Tdap vaccine during each pregnancy. 1 dose of Td every 10 years.  Varicella vaccine.** / Consult your health care provider. Pregnant females who do not have evidence of immunity should receive the first dose after pregnancy.  HPV vaccine. / 3 doses over 6 months, if 72 and younger. The vaccine is not recommended for use in pregnant females. However, pregnancy testing is not needed before receiving a dose.  Measles, mumps, rubella (MMR) vaccine.** / You need at least 1 dose of MMR if you were born in 1957 or later. You may also need a 2nd dose. For females of childbearing age, rubella immunity should be determined. If there is no evidence of immunity, females who are not pregnant should be vaccinated. If there is no evidence of immunity, females who are  pregnant should delay immunization until after  pregnancy.  Pneumococcal 13-valent conjugate (PCV13) vaccine.** / Consult your health care provider.  Pneumococcal polysaccharide (PPSV23) vaccine.** / 1 to 2 doses if you smoke cigarettes or if you have certain conditions.  Meningococcal vaccine.** / 1 dose if you are age 87 to 44 years and a Market researcher living in a residence hall, or have one of several medical conditions, you need to get vaccinated against meningococcal disease. You may also need additional booster doses.  Hepatitis A vaccine.** / Consult your health care provider.  Hepatitis B vaccine.** / Consult your health care provider.  Haemophilus influenzae type b (Hib) vaccine.** / Consult your health care provider. Ages 86 to 38 years  Blood pressure check.** / Every year.  Lipid and cholesterol check.** / Every 5 years beginning at age 49 years.  Lung cancer screening. / Every year if you are aged 71-80 years and have a 30-pack-year history of smoking and currently smoke or have quit within the past 15 years. Yearly screening is stopped once you have quit smoking for at least 15 years or develop a health problem that would prevent you from having lung cancer treatment.  Clinical breast exam.** / Every year after age 51 years.  BRCA-related cancer risk assessment.** / For women who have family members with a BRCA-related cancer (breast, ovarian, tubal, or peritoneal cancers).  Mammogram.** / Every year beginning at age 18 years and continuing for as long as you are in good health. Consult with your health care provider.  Pap test.** / Every 3 years starting at age 63 years through age 37 or 57 years with a history of 3 consecutive normal Pap tests.  HPV screening.** / Every 3 years from ages 41 years through ages 76 to 23 years with a history of 3 consecutive normal Pap tests.  Fecal occult blood test (FOBT) of stool. / Every year beginning at age 36 years and continuing until age 51 years. You may not need  to do this test if you get a colonoscopy every 10 years.  Flexible sigmoidoscopy or colonoscopy.** / Every 5 years for a flexible sigmoidoscopy or every 10 years for a colonoscopy beginning at age 36 years and continuing until age 35 years.  Hepatitis C blood test.** / For all people born from [birth year] through 1964 and any individual with known risks for hepatitis C.  Skin self-exam. / Monthly.  Influenza vaccine. / Every year.  Tetanus, diphtheria, and acellular pertussis (Tdap/Td) vaccine.** / Consult your health care provider. Pregnant women should receive 1 dose of Tdap vaccine during each pregnancy. 1 dose of Td every 10 years.  Varicella vaccine.** / Consult your health care provider. Pregnant females who do not have evidence of immunity should receive the first dose after pregnancy.  Zoster vaccine.** / 1 dose for adults aged 73 years or older.  Measles, mumps, rubella (MMR) vaccine.** / You need at least 1 dose of MMR if you were born in 1957 or later. You may also need a second dose. For females of childbearing age, rubella immunity should be determined. If there is no evidence of immunity, females who are not pregnant should be vaccinated. If there is no evidence of immunity, females who are pregnant should delay immunization until after pregnancy.  Pneumococcal 13-valent conjugate (PCV13) vaccine.** / Consult your health care provider.  Pneumococcal polysaccharide (PPSV23) vaccine.** / 1 to 2 doses if you smoke cigarettes or if you have certain conditions.  Meningococcal vaccine.** /  Consult your health care provider.  Hepatitis A vaccine.** / Consult your health care provider.  Hepatitis B vaccine.** / Consult your health care provider.  Haemophilus influenzae type b (Hib) vaccine.** / Consult your health care provider. Ages 80 years and over  Blood pressure check.** / Every year.  Lipid and cholesterol check.** / Every 5 years beginning at age 62 years.  Lung cancer  screening. / Every year if you are aged 32-80 years and have a 30-pack-year history of smoking and currently smoke or have quit within the past 15 years. Yearly screening is stopped once you have quit smoking for at least 15 years or develop a health problem that would prevent you from having lung cancer treatment.  Clinical breast exam.** / Every year after age 61 years.  BRCA-related cancer risk assessment.** / For women who have family members with a BRCA-related cancer (breast, ovarian, tubal, or peritoneal cancers).  Mammogram.** / Every year beginning at age 39 years and continuing for as long as you are in good health. Consult with your health care provider.  Pap test.** / Every 3 years starting at age 85 years through age 74 or 72 years with 3 consecutive normal Pap tests. Testing can be stopped between 65 and 70 years with 3 consecutive normal Pap tests and no abnormal Pap or HPV tests in the past 10 years.  HPV screening.** / Every 3 years from ages 55 years through ages 67 or 77 years with a history of 3 consecutive normal Pap tests. Testing can be stopped between 65 and 70 years with 3 consecutive normal Pap tests and no abnormal Pap or HPV tests in the past 10 years.  Fecal occult blood test (FOBT) of stool. / Every year beginning at age 81 years and continuing until age 22 years. You may not need to do this test if you get a colonoscopy every 10 years.  Flexible sigmoidoscopy or colonoscopy.** / Every 5 years for a flexible sigmoidoscopy or every 10 years for a colonoscopy beginning at age 67 years and continuing until age 22 years.  Hepatitis C blood test.** / For all people born from [birth year] through 1964 and any individual with known risks for hepatitis C.  Osteoporosis screening.** / A one-time screening for women ages 8 years and over and women at risk for fractures or osteoporosis.  Skin self-exam. / Monthly.  Influenza vaccine. / Every year.  Tetanus, diphtheria, and  acellular pertussis (Tdap/Td) vaccine.** / 1 dose of Td every 10 years.  Varicella vaccine.** / Consult your health care provider.  Zoster vaccine.** / 1 dose for adults aged 56 years or older.  Pneumococcal 13-valent conjugate (PCV13) vaccine.** / Consult your health care provider.  Pneumococcal polysaccharide (PPSV23) vaccine.** / 1 dose for all adults aged 15 years and older.  Meningococcal vaccine.** / Consult your health care provider.  Hepatitis A vaccine.** / Consult your health care provider.  Hepatitis B vaccine.** / Consult your health care provider.  Haemophilus influenzae type b (Hib) vaccine.** / Consult your health care provider. ** Family history and personal history of risk and conditions may change your health care provider's recommendations.   This information is not intended to replace advice given to you by your health care provider. Make sure you discuss any questions you have with your health care provider.   Document Released: 06/13/2001 Document Revised: 05/08/2014 Document Reviewed: 09/12/2010 Elsevier Interactive Patient Education [employer].

## 2016-03-07 NOTE — Progress Notes (Signed)
GYNECOLOGY ANNUAL PHYSICAL EXAM PROGRESS NOTE  Subjective:    Judy Munoz is a 45 y.o. G59P1001 married female who presents for an annual exam.  The patient is sexually active.  The patient wears seatbelts: yes. The patient participates in regular exercise: no. Has the patient ever been transfused or tattooed?: no. The patient reports that there is not domestic violence in her life.    The patient has the following complaints today:  1. The patient complains of abnormal period x 1. Notes that her cycle was 3 days late (started 01/30/16).  Cycle lasted 4 days then went off.  After this, toward the end of the month she had 10 days of spotting (mostly pink but occasionally bright red) beginning 02/27/16. Unsure if this was her "November period).  Reports that she took a home UPT which was negative.  Denies any h/o prior abnormal cycles and that cycles are usually very regular.  Wonders if this could be due to her history of fibroids or the start of menopause.   Gynecologic History Menarche age: 4 Patient's last menstrual period was 02/27/2016. Contraception: none History of STI's: Denies Last Pap: 01/2014. Results were: normal.  Denies h/o abnormal pap smears. Last mammogram: 03/2015. Results were: normal   Obstetric History   G1   P1   T1   P0   A0   L1    SAB0   TAB0   Ectopic0   Multiple0   Live Births1     # Outcome Date GA Lbr Len/2nd Weight Sex Delivery Anes PTL Lv  1 Term 2014 [redacted]w[redacted]d 5 lb 12 oz (2.608 kg) F Vag-Spont  N LIV      Past Medical History:  Diagnosis Date  . Elevated glucose   . GERD (gastroesophageal reflux disease)   . Heart murmur   . High cholesterol   . History of uterine fibroid   . IFG (impaired fasting glucose)   . PONV (postoperative nausea and vomiting)   . Vitamin D deficiency     Past Surgical History:  Procedure Laterality Date  . ESOPHAGOGASTRODUODENOSCOPY    . ESOPHAGOGASTRODUODENOSCOPY (EGD) WITH PROPOFOL N/A 07/26/2015   Procedure:  ESOPHAGOGASTRODUODENOSCOPY (EGD) WITH PROPOFOL;  Surgeon: DLucilla Lame MD;  [facility] [facility]  Service: Endoscopy;  Laterality: N/A;    Family History  Problem Relation Age of Onset  . Diabetes Father   . Heart disease Father   . Hypertension Father   . Hyperlipidemia Father   . Cancer Maternal Grandmother     uterine  . Breast cancer Maternal Grandmother [phone]   Social History   Social History  . Marital status: Married    Spouse name: N/A  . Number of children: N/A  . Years of education: N/A   Occupational History  . Not on file.   Social History Main Topics  . Smoking status: Never Smoker  . Smokeless tobacco: Never Used  . Alcohol use No  . Drug use: No  . Sexual activity: Yes    Birth control/ protection: None   Other Topics Concern  . Not on file   Social History Narrative  . No narrative on file    Current Outpatient Prescriptions on File Prior to Visit  Medication Sig Dispense Refill  . cetirizine (ZYRTEC) 10 MG tablet Take 10 mg by mouth daily.    . cholecalciferol (VITAMIN D) 1000 UNITS tablet Take 1,000 Units by mouth daily.    . pantoprazole (PROTONIX) 40 MG tablet  TAKE 1 TABLET BY MOUTH EVERY DAY 30 tablet 4  . pravastatin (PRAVACHOL) 20 MG tablet Take 1 tablet (20 mg total) by mouth at bedtime. 90 tablet 1   No current facility-administered medications on file prior to visit.     Allergies  Allergen Reactions  . Sulfa Antibiotics Hives      Review of Systems Constitutional: negative for chills, fatigue, fevers and sweats Eyes: negative for irritation, redness and visual disturbance Ears, nose, mouth, throat, and face: negative for hearing loss, nasal congestion, snoring and tinnitus Respiratory: negative for asthma, cough, sputum Cardiovascular: negative for chest pain, dyspnea, exertional chest pressure/discomfort, irregular heart beat, palpitations and syncope Gastrointestinal: negative for abdominal pain, change in bowel  habits, nausea and vomiting Genitourinary: positive for abnormal menstrual periods x 1 (see HPI).  Negative for genital lesions, sexual problems and vaginal discharge, dysuria and urinary incontinence Integument/breast: negative for breast lump, breast tenderness and nipple discharge Hematologic/lymphatic: negative for bleeding and easy bruising Musculoskeletal: positive for low back pain (mostly in right flank x 3 days).  Negative for muscle weakness Neurological: negative for dizziness, headaches, vertigo and weakness Endocrine: negative for diabetic symptoms including polydipsia, polyuria and skin dryness Allergic/Immunologic: negative for hay fever and urticaria        Objective:  Blood pressure 136/69, pulse 73, height 5' 3"  (1.6 m), weight 145 lb 6.4 oz (66 kg), last menstrual period 02/27/2016. Body mass index is 25.76 kg/m.  General Appearance:    Alert, cooperative, no distress, appears stated age, overweight  Head:    Normocephalic, without obvious abnormality, atraumatic  Eyes:    PERRL, conjunctiva/corneas clear, EOM's intact, both eyes  Ears:    Normal external ear canals, both ears  Nose:   Nares normal, septum midline, mucosa normal, no drainage or sinus tenderness  Throat:   Lips, mucosa, and tongue normal; teeth and gums normal  Neck:   Supple, symmetrical, trachea midline, no adenopathy; thyroid: no enlargement/tenderness/nodules; no carotid bruit or JVD  Back:     Symmetric, no curvature, ROM normal, no CVA tenderness  Lungs:     Clear to auscultation bilaterally, respirations unlabored  Chest Wall:    No tenderness or deformity   Heart:    Regular rate and rhythm, S1 and S2 normal, no murmur, rub or gallop  Breast Exam:    No tenderness, masses, or nipple abnormality  Abdomen:     Soft, non-tender, bowel sounds active all four quadrants, no masses, no organomegaly.    Genitalia:    Pelvic:external genitalia normal, vagina without lesions, discharge, or tenderness,  rectovaginal septum  normal. Cervix normal in appearance, no cervical motion tenderness, no adnexal masses or tenderness.  Uterus 10-12 week size, shape, mobile, regular contours, nontender.  Rectal:    Normal external sphincter.  No hemorrhoids appreciated. Internal exam not done.   Extremities:   Extremities normal, atraumatic, no cyanosis or edema  Pulses:   2+ and symmetric all extremities  Skin:   Skin color, texture, turgor normal, no rashes or lesions  Lymph nodes:   Cervical, supraclavicular, and axillary nodes normal  Neurologic:   CNII-XII intact, normal strength, sensation and reflexes throughout   .  Labs:  Lab Results  Component Value Date   WBC 6.9 03/02/2015   HCT 40.3 03/02/2015   MCV 87 03/02/2015   PLT 320 03/02/2015    Lab Results  Component Value Date   CREATININE 0.79 12/13/2015   BUN 9 12/13/2015   NA 142 12/13/2015  K 4.4 12/13/2015   CL 103 12/13/2015   CO2 22 12/13/2015    Lab Results  Component Value Date   ALT 13 12/13/2015   AST 16 12/13/2015   ALKPHOS 48 12/13/2015   BILITOT 0.3 12/13/2015    No results found for: TSH  Urinalysis    Component Value Date/Time   APPEARANCEUR Clear 03/07/2016 1349   GLUCOSEU Negative 03/07/2016 1349   BILIRUBINUR Negative 03/07/2016 1349   PROTEINUR Negative 03/07/2016 1349   NITRITE Negative 03/07/2016 1349   LEUKOCYTESUR Negative 03/07/2016 1349      Assessment:   Healthy female exam.  Abnormal menses H/o fibroid uterus Low back pain  Plan:    - Blood tests: Estradiol, FSH, LH, Progesterone level and TSH to assess hormonal status and cause of irregular bleeding.  Has had routine annual labs performed by PCP.  - Breast self exam technique reviewed and patient encouraged to perform self-exam monthly. - Contraception: none.  Declines contraception.  UPT negative in office today.  - Discussed healthy lifestyle modifications. - Mammogram ordered.   - Discussed abnormal menses.  Patient with h/o  small fibroids, previously asymptomatic. Uterine size unchanged since last year's exam, however could be a cause of her abnormal menses.  Patient also could be entering perimenopausal state.  Hormone profile ordered. Could be a fluke menses.  Normal exam noted today. Ultrasound ordered. If hormone levels and ultrasound unremarkable, and abnormal menses continues, will proceed with further workup of endometrial biopsy.  If no further episodes, can proceed with expectant management.  - UA ordered for mild right back/flank pain. - Received flu vaccine in September.  - RTC in 1 year, or sooner if symptoms persist.     Rubie Maid, MD [facility]

## 2016-03-08 ENCOUNTER — Ambulatory Visit (INDEPENDENT_AMBULATORY_CARE_PROVIDER_SITE_OTHER): Payer: BLUE CROSS/BLUE SHIELD

## 2016-03-08 DIAGNOSIS — N939 Abnormal uterine and vaginal bleeding, unspecified: Secondary | ICD-10-CM

## 2016-03-08 LAB — URINALYSIS, ROUTINE W REFLEX MICROSCOPIC
Bilirubin, UA: NEGATIVE
Glucose, UA: NEGATIVE
Ketones, UA: NEGATIVE
LEUKOCYTES UA: NEGATIVE
NITRITE UA: NEGATIVE
PH UA: 7.5 (ref 5.0–7.5)
Protein, UA: NEGATIVE
RBC, UA: NEGATIVE
SPEC GRAV UA: 1.027 (ref 1.005–1.030)
Urobilinogen, Ur: 0.2 mg/dL (ref 0.2–1.0)

## 2016-03-08 LAB — TSH: TSH: 1.88 u[IU]/mL (ref 0.450–4.500)

## 2016-03-08 LAB — URINE CULTURE: ORGANISM ID, BACTERIA: NO GROWTH

## 2016-03-15 ENCOUNTER — Telehealth: Payer: Self-pay | Admitting: Obstetrics and Gynecology

## 2016-03-15 NOTE — Telephone Encounter (Signed)
[place] was done last wed 11/8 and she is calling for results

## 2016-03-16 NOTE — Telephone Encounter (Signed)
Please inform patient that her uterine size is normal, however she does have 2 small fibroids (1 cm and 2 cm).  It is possible for these fibroids to have caused her irregular period.  We can continue to watch and wait as it may not affect every period and this was the first episode.  If this irregularity continues, then we will need to discuss management options at some point.  Also, you did have a small simple cyst (2 cm) on your right ovary, however this is a common occurrence in women and most times we never even know about them.  They usually resolve on their own without any problems.

## 2016-03-16 NOTE — Telephone Encounter (Signed)
Please advise 

## 2016-03-17 NOTE — Telephone Encounter (Signed)
Called pt had lengthy conversation informing her of the information below. Pt states that she has still been having some heavy bleeding for the past 3 days. Pt states that she was on OCPs in the past and while they helped with bleeding issues they caused mood issues. Advised pt that she had an array of options and should schedule appt with provider. Pt states that she will think about this and discuss with pt then make a decision.

## 2016-03-29 ENCOUNTER — Ambulatory Visit
Admission: RE | Admit: 2016-03-29 | Discharge: 2016-03-29 | Disposition: A | Discharge status: Home / Self Care | Payer: BLUE CROSS/BLUE SHIELD | Source: Ambulatory Visit | Attending: Family Medicine | Admitting: Family Medicine

## 2016-03-29 DIAGNOSIS — R928 Other abnormal and inconclusive findings on diagnostic imaging of breast: Secondary | ICD-10-CM | POA: Diagnosis not present

## 2016-03-29 DIAGNOSIS — Z1231 Encounter for screening mammogram for malignant neoplasm of breast: Secondary | ICD-10-CM | POA: Insufficient documentation

## 2016-03-31 ENCOUNTER — Other Ambulatory Visit: Payer: Self-pay | Admitting: Obstetrics and Gynecology

## 2016-03-31 ENCOUNTER — Other Ambulatory Visit: Payer: Self-pay | Admitting: Family Medicine

## 2016-03-31 DIAGNOSIS — R928 Other abnormal and inconclusive findings on diagnostic imaging of breast: Secondary | ICD-10-CM

## 2016-03-31 DIAGNOSIS — N6489 Other specified disorders of breast: Secondary | ICD-10-CM

## 2016-04-17 ENCOUNTER — Ambulatory Visit
Admission: RE | Admit: 2016-04-17 | Discharge: 2016-04-17 | Disposition: A | Discharge status: Home / Self Care | Payer: BLUE CROSS/BLUE SHIELD | Source: Ambulatory Visit | Attending: Obstetrics and Gynecology | Admitting: Obstetrics and Gynecology

## 2016-04-17 DIAGNOSIS — R928 Other abnormal and inconclusive findings on diagnostic imaging of breast: Secondary | ICD-10-CM

## 2016-04-17 DIAGNOSIS — N6489 Other specified disorders of breast: Secondary | ICD-10-CM | POA: Insufficient documentation

## 2016-05-22 ENCOUNTER — Encounter: Payer: Self-pay | Admitting: Family Medicine

## 2016-05-22 ENCOUNTER — Ambulatory Visit (INDEPENDENT_AMBULATORY_CARE_PROVIDER_SITE_OTHER): Payer: BLUE CROSS/BLUE SHIELD | Admitting: Family Medicine

## 2016-05-22 VITALS — BP 122/79 | HR 72 | Temp 98.7°F | Wt 144.0 lb

## 2016-05-22 DIAGNOSIS — J0100 Acute maxillary sinusitis, unspecified: Secondary | ICD-10-CM

## 2016-05-22 MED ORDER — AMOXICILLIN-POT CLAVULANATE 875-125 MG PO TABS: 1.0000 | ORAL_TABLET | Freq: Two times a day (BID) | ORAL | 0 refills | Status: DC

## 2016-05-22 NOTE — Patient Instructions (Signed)
Follow up as needed

## 2016-05-22 NOTE — Progress Notes (Signed)
   BP 122/79   Pulse 72   Temp 98.7 F (37.1 C)   Wt 144 lb (65.3 kg)   SpO2 100%   BMI 25.51 kg/m    Subjective:    Patient ID: Judy Munoz, female    DOB: [birth year], 46 y.o.   MRN: [id number]  HPI: Judy Munoz is a 46 y.o. female  Chief Complaint  Patient presents with  . Ear Pain    x 3 weeks. left ear pain. Lots of pressure in her head. Some dizziness. Been taking sudafed.  . Sinusitis    x 3 weeks, some head congestion and face pain. Occasional runny nose and nasal congestion.   Patient presents with 3 weeks of worsening facial pain, left ear pain, dizziness especially with laying down, and nausea related to dizziness. Denies fever, chills, aches, CP, SOB, cough. Taking sudafed with temporary relief, but sxs return when it wears off. No sick contacts reported.   Relevant past medical, surgical, family and social history reviewed and updated as indicated. Interim medical history since our last visit reviewed. Allergies and medications reviewed and updated.  Review of Systems  Constitutional: Negative.   HENT: Positive for congestion, ear pain, sinus pain, sinus pressure and sore throat.   Eyes: Negative.   Respiratory: Negative.   Cardiovascular: Negative.   Gastrointestinal: Positive for nausea.  Genitourinary: Negative.   Musculoskeletal: Negative.   Neurological: Positive for dizziness and headaches.  Psychiatric/Behavioral: Negative.     Per HPI unless specifically indicated above     Objective:    BP 122/79   Pulse 72   Temp 98.7 F (37.1 C)   Wt 144 lb (65.3 kg)   SpO2 100%   BMI 25.51 kg/m   Wt Readings from Last 3 Encounters:  05/22/16 144 lb (65.3 kg)  03/07/16 145 lb 6.4 oz (66 kg)  12/13/15 144 lb (65.3 kg)    Physical Exam  Constitutional: She is oriented to person, place, and time. She appears well-developed and well-nourished. No distress.  HENT:  Head: Atraumatic.  B/l frontal and maxillary sinus TTP Nares with some dried mucus  b/l Oropharynx erythematous  Eyes: Conjunctivae are normal. Pupils are equal, round, and reactive to light. No scleral icterus.  Neck: Normal range of motion. Neck supple.  Cardiovascular: Normal rate.   Pulmonary/Chest: Effort normal and breath sounds normal. No respiratory distress.  Musculoskeletal: Normal range of motion.  Neurological: She is alert and oriented to person, place, and time.  Skin: Skin is warm and dry.  Psychiatric: She has a normal mood and affect. Her behavior is normal.  Nursing note and vitals reviewed.     Assessment & Plan:   Problem List Items Addressed This Visit    None    Visit Diagnoses    Acute maxillary sinusitis, recurrence not specified    -  Primary   Augmentin sent, discussed sudafed, zyrtec, flonase regimen. Supportive care discussed. F/u if no improvement.    Relevant Medications   amoxicillin-clavulanate (AUGMENTIN) 875-125 MG tablet       Follow up plan: Return if symptoms worsen or fail to improve.

## 2016-06-05 ENCOUNTER — Ambulatory Visit (INDEPENDENT_AMBULATORY_CARE_PROVIDER_SITE_OTHER): Payer: BLUE CROSS/BLUE SHIELD | Admitting: Gastroenterology

## 2016-06-05 ENCOUNTER — Encounter: Payer: Self-pay | Admitting: Gastroenterology

## 2016-06-05 VITALS — BP 122/69 | HR 66 | Temp 98.4°F | Ht 63.0 in | Wt 146.0 lb

## 2016-06-05 DIAGNOSIS — R1011 Right upper quadrant pain: Secondary | ICD-10-CM | POA: Diagnosis not present

## 2016-06-05 DIAGNOSIS — R110 Nausea: Secondary | ICD-10-CM

## 2016-06-05 NOTE — Progress Notes (Signed)
   Primary Care Physician: Park Liter, DO  Primary Gastroenterologist:  Dr. Lucilla Lame  Chief Complaint  Patient presents with  . RUQ abdominal pain  . Nausea  . Gastroesophageal Reflux    HPI: Judy Munoz is a 46 y.o. female here with the report of having right upper quadrant pain with nausea.  The patient states this symptom is new to her and was associated with eating.Shortly after this the patient states that she started to have a fullness in her throat and may be having some postnasal drip.  The patient also reports that her pain is better but still present in the right upper quadrant.  The patient had an upper endoscopy back in March 2017 without any cause for the present symptoms found.  Current Outpatient Prescriptions  Medication Sig Dispense Refill  . cetirizine (ZYRTEC) 10 MG tablet Take 10 mg by mouth daily.    . cholecalciferol (VITAMIN D) 1000 UNITS tablet Take 1,000 Units by mouth daily.    . pantoprazole (PROTONIX) 40 MG tablet TAKE 1 TABLET BY MOUTH EVERY DAY 30 tablet 4  . pravastatin (PRAVACHOL) 20 MG tablet Take 1 tablet (20 mg total) by mouth at bedtime. 90 tablet 1   No current facility-administered medications for this visit.     Allergies as of 06/05/2016 - Review Complete 06/05/2016  Allergen Reaction Noted  . Sulfa antibiotics Hives 03/02/2015    ROS:  General: Negative for anorexia, weight loss, fever, chills, fatigue, weakness. ENT: Negative for hoarseness, difficulty swallowing , nasal congestion. CV: Negative for chest pain, angina, palpitations, dyspnea on exertion, peripheral edema.  Respiratory: Negative for dyspnea at rest, dyspnea on exertion, cough, sputum, wheezing.  GI: See history of present illness. GU:  Negative for dysuria, hematuria, urinary incontinence, urinary frequency, nocturnal urination.  Endo: Negative for unusual weight change.    Physical Examination:   BP 122/69   Pulse 66   Temp 98.4 F (36.9 C) (Oral)   Ht 5'  3" (1.6 m)   Wt 146 lb (66.2 kg)   BMI 25.86 kg/m   General: Well-nourished, well-developed in no acute distress.  Eyes: No icterus. Conjunctivae pink. Mouth: Oropharyngeal mucosa moist and pink , no lesions erythema or exudate. Lungs: Clear to auscultation bilaterally. Non-labored. Heart: Regular rate and rhythm, no murmurs rubs or gallops.  Abdomen: Bowel sounds are normal, nontender, nondistended, no hepatosplenomegaly or masses, no abdominal bruits or hernia , no rebound or guarding.   Extremities: No lower extremity edema. No clubbing or deformities. Neuro: Alert and oriented x 3.  Grossly intact. Skin: Warm and dry, no jaundice.   Psych: Alert and cooperative, normal mood and affect.  Labs:    Imaging Studies: No results found.  Assessment and Plan:   Judy Munoz is a 46 y.o. y/o female who comes in today with a history of right upper quadrant pain that is new.  The patient says it has been associated with eating.  The patient will have a right upper quadrant ultrasound and a HIDA scan set up.  The patient has been explained the plan and agrees with it.    Lucilla Lame, MD. [facility]   Note: This dictation was prepared with Dragon dictation along with smaller phrase technology. Any transcriptional errors that result from this process are unintentional.

## 2016-06-05 NOTE — Patient Instructions (Signed)
You are scheduled for a RUQ abdominal US/HIDA scan at [facility] outpatient imaging, [place] [facility] location on Wednesday, February 21st at 9:15am. Please arrive at 9:00am. You cannot have anything to eat or drink after midnight.   Samples of Dexilant 64m were given to you today. Please take 1 capsule daily. Call me if medication works and a prescription will be sent to your pharmacy.

## 2016-06-14 ENCOUNTER — Ambulatory Visit (INDEPENDENT_AMBULATORY_CARE_PROVIDER_SITE_OTHER): Payer: BLUE CROSS/BLUE SHIELD | Admitting: Family Medicine

## 2016-06-14 ENCOUNTER — Encounter: Payer: Self-pay | Admitting: Family Medicine

## 2016-06-14 VITALS — BP 121/82 | HR 65 | Temp 98.5°F | Resp 17 | Ht 63.25 in | Wt 143.0 lb

## 2016-06-14 DIAGNOSIS — E559 Vitamin D deficiency, unspecified: Secondary | ICD-10-CM | POA: Diagnosis not present

## 2016-06-14 DIAGNOSIS — B373 Candidiasis of vulva and vagina: Secondary | ICD-10-CM

## 2016-06-14 DIAGNOSIS — K219 Gastro-esophageal reflux disease without esophagitis: Secondary | ICD-10-CM | POA: Diagnosis not present

## 2016-06-14 DIAGNOSIS — R7301 Impaired fasting glucose: Secondary | ICD-10-CM

## 2016-06-14 DIAGNOSIS — Z0000 Encounter for general adult medical examination without abnormal findings: Secondary | ICD-10-CM

## 2016-06-14 DIAGNOSIS — E7800 Pure hypercholesterolemia, unspecified: Secondary | ICD-10-CM

## 2016-06-14 DIAGNOSIS — B3731 Acute candidiasis of vulva and vagina: Secondary | ICD-10-CM

## 2016-06-14 LAB — UA/M W/RFLX CULTURE, ROUTINE
Bilirubin, UA: NEGATIVE
Glucose, UA: NEGATIVE
KETONES UA: NEGATIVE
Leukocytes, UA: NEGATIVE
NITRITE UA: NEGATIVE
Protein, UA: NEGATIVE
SPEC GRAV UA: 1.02 (ref 1.005–1.030)
Urobilinogen, Ur: 0.2 mg/dL (ref 0.2–1.0)
pH, UA: 7 (ref 5.0–7.5)

## 2016-06-14 LAB — MICROSCOPIC EXAMINATION
Bacteria, UA: NONE SEEN
WBC UA: NONE SEEN /HPF (ref 0–?)

## 2016-06-14 LAB — BAYER DCA HB A1C WAIVED: HB A1C (BAYER DCA - WAIVED): 5.8 % (ref <7.0)

## 2016-06-14 MED ORDER — PRAVASTATIN SODIUM 20 MG PO TABS: 20.0000 mg | ORAL_TABLET | Freq: Every day | ORAL | 1 refills | Status: DC

## 2016-06-14 MED ORDER — DEXLANSOPRAZOLE 60 MG PO CPDR: 60.0000 mg | DELAYED_RELEASE_CAPSULE | Freq: Every day | ORAL | 6 refills | Status: DC

## 2016-06-14 MED ORDER — FLUCONAZOLE 150 MG PO TABS: 150.0000 mg | ORAL_TABLET | Freq: Every day | ORAL | 0 refills | Status: DC

## 2016-06-14 NOTE — Patient Instructions (Addendum)

## 2016-06-14 NOTE — Assessment & Plan Note (Signed)
Rechecking levels again today. Await results. Treat as needed.

## 2016-06-14 NOTE — Assessment & Plan Note (Addendum)
A1c came back at 5.8 today. Continue current regimen. Continue to monitor.

## 2016-06-14 NOTE — Assessment & Plan Note (Signed)
Rechecking levels again today. Await results. Continue current regimen. Continue to monitor.

## 2016-06-14 NOTE — Progress Notes (Signed)
BP 121/82 (BP Location: Left Arm, Patient Position: Sitting, Cuff Size: Normal)   Pulse 65   Temp 98.5 F (36.9 C) (Oral)   Resp 17   Ht 5' 3.25" (1.607 m)   Wt 143 lb (64.9 kg)   LMP 05/25/2016   SpO2 100%   BMI 25.13 kg/m    Subjective:    Patient ID: Judy Munoz, female    DOB: 1971-01-10, 46 y.o.   MRN: [id number]  HPI: Judy Munoz is a 46 y.o. female presenting on 06/14/2016 for comprehensive medical examination. Current medical complaints include:  GERD- still having issues with her belly. Concerned about gall bladder. To have HIDA and RUQ ultrasound GERD control status: better  Satisfied with current treatment? yes Heartburn frequency: never Medication side effects: no  Medication compliance: better Previous GERD medications: omeprazole, pantoprazole Dysphagia: no Odynophagia:  no Hematemesis: no Blood in stool: no EGD: yes  HYPERLIPIDEMIA Hyperlipidemia status: excellent compliance Satisfied with current treatment?  yes Side effects:  no Medication compliance: excellent compliance Past cholesterol meds: pravastatin (pravachol) Supplements: none Aspirin:  no Chest pain:  no Coronary artery disease:  no Family history CAD:  yes  She currently lives with: husband and kids Menopausal Symptoms: no  Depression Screen done today and results listed below:  Depression screen [facility] 2/9 06/14/2016 12/13/2015  Decreased Interest 0 0  Down, Depressed, Hopeless 0 0  PHQ - 2 Score 0 0  Altered sleeping - 0  Tired, decreased energy - 0  Change in appetite - 0  Feeling bad or failure about yourself  - 0  Trouble concentrating - 0  Moving slowly or fidgety/restless - 0  Suicidal thoughts - 0  PHQ-9 Score - 0    Past Medical History:  Past Medical History:  Diagnosis Date  . Elevated glucose   . GERD (gastroesophageal reflux disease)   . Heart murmur   . High cholesterol   . History of uterine fibroid   . IFG (impaired fasting glucose)   . PONV  (postoperative nausea and vomiting)   . Vitamin D deficiency     Surgical History:  Past Surgical History:  Procedure Laterality Date  . ESOPHAGOGASTRODUODENOSCOPY    . ESOPHAGOGASTRODUODENOSCOPY (EGD) WITH PROPOFOL N/A 07/26/2015   Procedure: ESOPHAGOGASTRODUODENOSCOPY (EGD) WITH PROPOFOL;  Surgeon: Lucilla Lame, MD;  Location: [place];  Service: Endoscopy;  Laterality: N/A;    Medications:  Current Outpatient Prescriptions on File Prior to Visit  Medication Sig  . cetirizine (ZYRTEC) 10 MG tablet Take 10 mg by mouth daily.  . cholecalciferol (VITAMIN D) 1000 UNITS tablet Take 1,000 Units by mouth daily.   No current facility-administered medications on file prior to visit.     Allergies:  Allergies  Allergen Reactions  . Sulfa Antibiotics Hives    Social History:  Social History   Social History  . Marital status: Married    Spouse name: N/A  . Number of children: N/A  . Years of education: N/A   Occupational History  . Not on file.   Social History Main Topics  . Smoking status: Never Smoker  . Smokeless tobacco: Never Used  . Alcohol use No  . Drug use: No  . Sexual activity: Yes    Birth control/ protection: None   Other Topics Concern  . Not on file   Social History Narrative  . No narrative on file   History  Smoking Status  . Never Smoker  Smokeless Tobacco  . Never Used  History  Alcohol Use No    Family History:  Family History  Problem Relation Age of Onset  . Diabetes Father   . Heart disease Father   . Hypertension Father   . Hyperlipidemia Father   . Cancer Maternal Grandmother     uterine  . Breast cancer Maternal Grandmother 69    Past medical history, surgical history, medications, allergies, family history and social history reviewed with patient today and changes made to appropriate areas of the chart.   Review of Systems  Constitutional: Negative.   HENT: Negative.   Eyes: Negative.   Respiratory:  Negative.   Cardiovascular: Negative.   Gastrointestinal: Positive for abdominal pain and nausea. Negative for blood in stool, constipation, diarrhea, heartburn, melena and vomiting.  Genitourinary: Positive for dysuria. Negative for flank pain, frequency, hematuria and urgency.       Vaginal discharge  Musculoskeletal: Negative.   Skin: Negative.   Neurological: Negative.   Endo/Heme/Allergies: Positive for environmental allergies. Negative for polydipsia. Does not bruise/bleed easily.  Psychiatric/Behavioral: Negative.     All other ROS negative except what is listed above and in the HPI.      Objective:    BP 121/82 (BP Location: Left Arm, Patient Position: Sitting, Cuff Size: Normal)   Pulse 65   Temp 98.5 F (36.9 C) (Oral)   Resp 17   Ht 5' 3.25" (1.607 m)   Wt 143 lb (64.9 kg)   LMP 05/25/2016   SpO2 100%   BMI 25.13 kg/m   Wt Readings from Last 3 Encounters:  06/14/16 143 lb (64.9 kg)  06/05/16 146 lb (66.2 kg)  05/22/16 144 lb (65.3 kg)    Physical Exam  Results for orders placed or performed in visit on 03/07/16  Urine culture  Result Value Ref Range   Urine Culture, Routine Final report    Urine Culture result 1 No growth   TSH  Result Value Ref Range   TSH 1.880 0.450 - 4.500 uIU/mL  Urinalysis, Routine w reflex microscopic (not at [facility])  Result Value Ref Range   Specific Gravity, UA 1.027 1.005 - 1.030   pH, UA 7.5 5.0 - 7.5   Color, UA Yellow Yellow   Appearance Ur Clear Clear   Leukocytes, UA Negative Negative   Protein, UA Negative Negative/Trace   Glucose, UA Negative Negative   Ketones, UA Negative Negative   RBC, UA Negative Negative   Bilirubin, UA Negative Negative   Urobilinogen, Ur 0.2 0.2 - 1.0 mg/dL   Nitrite, UA Negative Negative   Microscopic Examination Comment   POCT urine pregnancy  Result Value Ref Range   Preg Test, Ur Negative Negative      Assessment & Plan:   Problem List Items Addressed This Visit      Digestive    GERD (gastroesophageal reflux disease)    Stable. Continue current regimen. Continue to monitor. Call with any concerns.       Relevant Medications   dexlansoprazole (DEXILANT) 60 MG capsule   Other Relevant Orders   CBC with Differential/Platelet   Comprehensive metabolic panel   TSH     Endocrine   IFG (impaired fasting glucose)    A1c came back at today. Continue current regimen. Continue to monitor.       Relevant Orders   Bayer DCA Hb A1c Waived   Comprehensive metabolic panel   TSH   UA/M w/rflx Culture, Routine     Other   High cholesterol  Rechecking levels again today. Await results. Continue current regimen. Continue to monitor.       Relevant Medications   pravastatin (PRAVACHOL) 20 MG tablet   Other Relevant Orders   Comprehensive metabolic panel   Lipid Panel w/o Chol/HDL Ratio   TSH   Vitamin D deficiency    Rechecking levels again today. Await results. Treat as needed.       Relevant Orders   Comprehensive metabolic panel   VITAMIN D 25 Hydroxy (Vit-D Deficiency, Fractures)    Other Visit Diagnoses    Routine general medical examination at a health care facility    -  Primary   Up to date on vaccines. Screening labs checked today. Pap and mammo through GYN. Continue diet and exercise. Call with any concerns.    Relevant Orders   Bayer DCA Hb A1c Waived   CBC with Differential/Platelet   Comprehensive metabolic panel   Lipid Panel w/o Chol/HDL Ratio   TSH   UA/M w/rflx Culture, Routine   Vaginal yeast infection       Diflucan sent to her pharmacy.    Relevant Medications   fluconazole (DIFLUCAN) 150 MG tablet       Follow up plan: Return in about 6 months (around 12/12/2016) for Cholesterol follow up.   LABORATORY TESTING:  - Pap smear: done elsewhere  IMMUNIZATIONS:   - Tdap: Tetanus vaccination status reviewed: last tetanus booster within 10 years. - Influenza: Up to date - Pneumovax: Not applicable  SCREENING: -Mammogram: Done  elsewhere   PATIENT COUNSELING:   Advised to take 1 mg of folate supplement per day if capable of pregnancy.   Sexuality: Discussed sexually transmitted diseases, partner selection, use of condoms, avoidance of unintended pregnancy  and contraceptive alternatives.   Advised to avoid cigarette smoking.  I discussed with the patient that most people either abstain from alcohol or drink within safe limits (<=14/week and <=4 drinks/occasion for males, <=7/weeks and <= 3 drinks/occasion for females) and that the risk for alcohol disorders and other health effects rises proportionally with the number of drinks per week and how often a drinker exceeds daily limits.  Discussed cessation/primary prevention of drug use and availability of treatment for abuse.   Diet: Encouraged to adjust caloric intake to maintain  or achieve ideal body weight, to reduce intake of dietary saturated fat and total fat, to limit sodium intake by avoiding high sodium foods and not adding table salt, and to maintain adequate dietary potassium and calcium preferably from fresh fruits, vegetables, and low-fat dairy products.    stressed the importance of regular exercise  Injury prevention: Discussed safety belts, safety helmets, smoke detector, smoking near bedding or upholstery.   Dental health: Discussed importance of regular tooth brushing, flossing, and dental visits.    NEXT PREVENTATIVE PHYSICAL DUE IN 1 YEAR. Return in about 6 months (around 12/12/2016) for Cholesterol follow up.

## 2016-06-14 NOTE — Assessment & Plan Note (Signed)
Stable. Continue current regimen. Continue to monitor. Call with any concerns.

## 2016-06-15 LAB — CBC WITH DIFFERENTIAL/PLATELET
BASOS: 0 %
Basophils Absolute: 0 10*3/uL (ref 0.0–0.2)
EOS (ABSOLUTE): 0 10*3/uL (ref 0.0–0.4)
Eos: 0 %
HEMATOCRIT: 38.4 % (ref 34.0–46.6)
HEMOGLOBIN: 12.8 g/dL (ref 11.1–15.9)
Immature Grans (Abs): 0 10*3/uL (ref 0.0–0.1)
Immature Granulocytes: 0 %
LYMPHS ABS: 2.6 10*3/uL (ref 0.7–3.1)
Lymphs: 36 %
MCH: 28.1 pg (ref 26.6–33.0)
MCHC: 33.3 g/dL (ref 31.5–35.7)
MCV: 84 fL (ref 79–97)
MONOCYTES: 8 %
Monocytes Absolute: 0.6 10*3/uL (ref 0.1–0.9)
NEUTROS ABS: 4.1 10*3/uL (ref 1.4–7.0)
Neutrophils: 56 %
Platelets: 331 10*3/uL (ref 150–379)
RBC: 4.56 x10E6/uL (ref 3.77–5.28)
RDW: 14.2 % (ref 12.3–15.4)
WBC: 7.3 10*3/uL (ref 3.4–10.8)

## 2016-06-15 LAB — COMPREHENSIVE METABOLIC PANEL
ALBUMIN: 4.7 g/dL (ref 3.5–5.5)
ALK PHOS: 49 IU/L (ref 39–117)
ALT: 12 IU/L (ref 0–32)
AST: 13 IU/L (ref 0–40)
Albumin/Globulin Ratio: 2 (ref 1.2–2.2)
BUN / CREAT RATIO: 10 (ref 9–23)
BUN: 8 mg/dL (ref 6–24)
Bilirubin Total: 0.4 mg/dL (ref 0.0–1.2)
CO2: 20 mmol/L (ref 18–29)
CREATININE: 0.79 mg/dL (ref 0.57–1.00)
Calcium: 9.6 mg/dL (ref 8.7–10.2)
Chloride: 100 mmol/L (ref 96–106)
GFR calc Af Amer: 105 mL/min/{1.73_m2} (ref >59)
GFR calc non Af Amer: 91 mL/min/{1.73_m2} (ref >59)
GLOBULIN, TOTAL: 2.3 g/dL (ref 1.5–4.5)
Glucose: 98 mg/dL (ref 65–99)
Potassium: 4.6 mmol/L (ref 3.5–5.2)
SODIUM: 141 mmol/L (ref 134–144)
Total Protein: 7 g/dL (ref 6.0–8.5)

## 2016-06-15 LAB — VITAMIN D 25 HYDROXY (VIT D DEFICIENCY, FRACTURES): Vit D, 25-Hydroxy: 37.1 ng/mL (ref 30.0–100.0)

## 2016-06-15 LAB — LIPID PANEL W/O CHOL/HDL RATIO
CHOLESTEROL TOTAL: 173 mg/dL (ref 100–199)
HDL: 62 mg/dL (ref >39)
LDL CALC: 97 mg/dL (ref 0–99)
Triglycerides: 69 mg/dL (ref 0–149)
VLDL Cholesterol Cal: 14 mg/dL (ref 5–40)

## 2016-06-15 LAB — TSH: TSH: 1.71 u[IU]/mL (ref 0.450–4.500)

## 2016-06-18 ENCOUNTER — Other Ambulatory Visit: Payer: Self-pay | Admitting: Family Medicine

## 2016-06-21 ENCOUNTER — Telehealth: Payer: Self-pay | Admitting: Gastroenterology

## 2016-06-21 ENCOUNTER — Ambulatory Visit: Payer: BLUE CROSS/BLUE SHIELD

## 2016-06-21 NOTE — Telephone Encounter (Signed)
Dexilant needs auth. She has some questions. Please call. She only has 4 left.

## 2016-06-22 ENCOUNTER — Other Ambulatory Visit: Payer: Self-pay

## 2016-06-22 MED ORDER — PANTOPRAZOLE SODIUM 40 MG PO TBEC: 40.0000 mg | DELAYED_RELEASE_TABLET | Freq: Two times a day (BID) | ORAL | 6 refills | Status: DC

## 2016-06-22 NOTE — Telephone Encounter (Signed)
Pt stated the [place] didn't really work for her. She was feeling better on the Pantoprazole and requested a refill.

## 2016-06-23 ENCOUNTER — Ambulatory Visit
Admission: RE | Admit: 2016-06-23 | Discharge: 2016-06-23 | Disposition: A | Discharge status: Home / Self Care | Payer: BLUE CROSS/BLUE SHIELD | Source: Ambulatory Visit | Attending: Gastroenterology | Admitting: Gastroenterology

## 2016-06-23 DIAGNOSIS — R1011 Right upper quadrant pain: Secondary | ICD-10-CM | POA: Diagnosis present

## 2016-06-23 DIAGNOSIS — R110 Nausea: Secondary | ICD-10-CM

## 2016-06-23 MED ORDER — TECHNETIUM TC 99M MEBROFENIN IV KIT
5.0000 | PACK | Freq: Once | INTRAVENOUS | Status: AC | PRN
  Administered 2016-06-23: 5.2 via INTRAVENOUS

## 2016-06-26 ENCOUNTER — Other Ambulatory Visit: Payer: Self-pay

## 2016-06-26 ENCOUNTER — Telehealth: Payer: Self-pay

## 2016-06-26 DIAGNOSIS — G8929 Other chronic pain: Secondary | ICD-10-CM

## 2016-06-26 DIAGNOSIS — R1011 Right upper quadrant pain: Principal | ICD-10-CM

## 2016-06-26 NOTE — Telephone Encounter (Signed)
-----   Message from Lucilla Lame, MD sent at 06/26/2016  2:50 PM EST ----- Let the patient know that her gallbladder had a abnormally low ejection fraction. She should be set up to see a Psychologist, sport and exercise.

## 2016-06-26 NOTE — Telephone Encounter (Signed)
Pt's husband has been notified of HIDA scan results. Advised a referral has been sent to [facility] for a consultation.

## 2016-07-05 ENCOUNTER — Telehealth: Payer: Self-pay

## 2016-07-05 ENCOUNTER — Encounter: Payer: Self-pay | Admitting: Surgery

## 2016-07-05 ENCOUNTER — Ambulatory Visit (INDEPENDENT_AMBULATORY_CARE_PROVIDER_SITE_OTHER): Payer: BLUE CROSS/BLUE SHIELD | Admitting: Surgery

## 2016-07-05 VITALS — BP 123/79 | HR 66 | Temp 98.3°F | Ht 63.0 in | Wt 142.0 lb

## 2016-07-05 DIAGNOSIS — K828 Other specified diseases of gallbladder: Secondary | ICD-10-CM

## 2016-07-05 NOTE — Progress Notes (Signed)
Patient ID: Judy Munoz, female   DOB: 1971/01/06, 46 y.o.   MRN: [id number]  HPI Judy Munoz is a 46 y.o. female referred by Dr.Wohl for biliary dyskinesia. She has had at least 2 month history of right upper quadrant pain that is intermittent moderate in intensity and dull in nature. The pain is exacerbated by heavy meals. At there is no specific out of eating factors. She experiences some nausea but no vomiting. No fevers no chills no evidence of biliary obstruction.  Her workup have included a right upper quadrant ultrasound that I have personally reviewed showing normal gallbladder without stones, normal common bile duct. Her LFTs are normal. She did have an EGD Dr. wall that was premarked unremarkable. And her HIDA scan show evidence of decreased ejection fraction with reproduction of symptoms. She has very good cardiovascular reserve and is able to perform more than 4 Mets without shortness of breath or chest pain. She has never had any abdominal operations before. Both parents have had their gallbladders removed  HPI  Past Medical History:  Diagnosis Date  . Elevated glucose   . GERD (gastroesophageal reflux disease)   . Heart murmur   . High cholesterol   . History of uterine fibroid   . IFG (impaired fasting glucose)   . PONV (postoperative nausea and vomiting)   . Vitamin D deficiency     Past Surgical History:  Procedure Laterality Date  . ESOPHAGOGASTRODUODENOSCOPY    . ESOPHAGOGASTRODUODENOSCOPY (EGD) WITH PROPOFOL N/A 07/26/2015   Procedure: ESOPHAGOGASTRODUODENOSCOPY (EGD) WITH PROPOFOL;  Surgeon: Lucilla Lame, MD;  Location: [facility];  Service: Endoscopy;  Laterality: N/A;    Family History  Problem Relation Age of Onset  . Diabetes Father   . Heart disease Father   . Hypertension Father   . Hyperlipidemia Father   . Healthy Mother   . Cancer Maternal Grandmother     uterine  . Breast cancer Maternal Grandmother 22  . Lung cancer Maternal Uncle      Social History Social History  Substance Use Topics  . Smoking status: Never Smoker  . Smokeless tobacco: Never Used  . Alcohol use No    Allergies  Allergen Reactions  . Sulfa Antibiotics Hives    Current Outpatient Prescriptions  Medication Sig Dispense Refill  . cetirizine (ZYRTEC) 10 MG tablet Take 10 mg by mouth daily.    . cholecalciferol (VITAMIN D) 1000 UNITS tablet Take 1,000 Units by mouth daily.    . pantoprazole (PROTONIX) 40 MG tablet Take 1 tablet (40 mg total) by mouth 2 (two) times daily. 60 tablet 6  . pravastatin (PRAVACHOL) 20 MG tablet Take 1 tablet (20 mg total) by mouth at bedtime. 90 tablet 1   No current facility-administered medications for this visit.      Review of Systems A 10 point review of systems was asked and was negative except for the information on the HPI  Physical Exam Blood pressure 123/79, pulse 66, temperature 98.3 F (36.8 C), temperature source Oral, height 5' 3"  (1.6 m), weight 64.4 kg (142 lb), last menstrual period 06/14/2016. CONSTITUTIONAL: NAD. EYES: Pupils are equal, round, and reactive to light, Sclera are non-icteric. EARS, NOSE, MOUTH AND THROAT: The oropharynx is clear. The oral mucosa is pink and moist. Hearing is intact to voice. LYMPH NODES:  Lymph nodes in the neck are normal. RESPIRATORY:  Lungs are clear. There is normal respiratory effort, with equal breath sounds bilaterally, and without pathologic use of accessory muscles. CARDIOVASCULAR: Heart  is regular without murmurs, gallops, or rubs. GI: The abdomen is soft, nontender, and nondistended. There are no palpable masses. There is no hepatosplenomegaly. There are normal bowel sounds in all quadrants. GU: Rectal deferred.   MUSCULOSKELETAL: Normal muscle strength and tone. No cyanosis or edema.   SKIN: Turgor is good and there are no pathologic skin lesions or ulcers. NEUROLOGIC: Motor and sensation is grossly normal. Cranial nerves are grossly intact. PSYCH:   Oriented to person, place and time. Affect is normal.  Data Reviewed  I have personally reviewed the patient's imaging, laboratory findings and medical records.    Assessment/Plan 46 yo w biliary dyskinesia. D/W the pt in detail about her disease process. So far all her GI w/u has been negative and she did experience similar sxs when she had her HIDA scan. I do think that she will benefit from cholecystectomy. The risks, benefits, complications, treatment options, and expected outcomes were discussed with the patient. The possibilities of bleeding, recurrent infection, finding a normal gallbladder, perforation of viscus organs, damage to surrounding structures, bile leak, abscess formation, needing a drain placed, the need for additional procedures, reaction to medication, pulmonary aspiration,  failure to diagnose a condition, the possible need to convert to an open procedure, and creating a complication requiring transfusion or operation were discussed with the patient. The patient and/or family concurred with the proposed plan, giving informed consent.   Caroleen Hamman, MD FACS General Surgeon 07/05/2016, 9:51 AM

## 2016-07-05 NOTE — Telephone Encounter (Signed)
Patient has been advised of Surgery Date as well as Pre-Admission appointment date, time, and location.  Surgery Date: 07/18/16  Pre-admit Appointment: 07/11/16 from 9a-1p (Phone)  Patient has been advised to call [phone] the day before surgery between 1-3pm to obtain arrival time.

## 2016-07-05 NOTE — Patient Instructions (Signed)
Please look at your blue sheet in case you have any questions or concerns. Amber will call you with your surgery information.

## 2016-07-06 NOTE — Telephone Encounter (Signed)
No authorization required for CPT code 47562 per Isha L @ 8:11 am on 07/06/16 @ [place].

## 2016-07-11 ENCOUNTER — Encounter
Admission: RE | Admit: 2016-07-11 | Discharge: 2016-07-11 | Disposition: A | Discharge status: Home / Self Care | Payer: BLUE CROSS/BLUE SHIELD | Source: Ambulatory Visit | Attending: Surgery | Admitting: Surgery

## 2016-07-11 HISTORY — DX: Anemia, unspecified: D64.9

## 2016-07-11 HISTORY — DX: Headache, unspecified: R51.9

## 2016-07-11 HISTORY — DX: Headache: R51

## 2016-07-11 NOTE — Patient Instructions (Signed)
  Your procedure is scheduled on: 07-18-16 Report to Same Day Surgery 2nd floor medical mall [facility] Entrance-take elevator on left to 2nd floor.  Check in with surgery information desk.) To find out your arrival time please call [phone] between 1PM - 3PM on 07-17-16  Remember: Instructions that are not followed completely may result in serious medical risk, up to and including death, or upon the discretion of your surgeon and anesthesiologist your surgery may need to be rescheduled.    _x___ 1. Do not eat food or drink liquids after midnight. No gum chewing or hard candies.     __x__ 2. No Alcohol for 24 hours before or after surgery.   __x__3. No Smoking for 24 prior to surgery.   ____  4. Bring all medications with you on the day of surgery if instructed.    __x__ 5. Notify your doctor if there is any change in your medical condition     (cold, fever, infections).     Do not wear jewelry, make-up, hairpins, clips or nail polish.  Do not wear lotions, powders, or perfumes. You may wear deodorant.  Do not shave 48 hours prior to surgery. Men may shave face and neck.  Do not bring valuables to the hospital.    [facility] is not responsible for any belongings or valuables.               Contacts, dentures or bridgework may not be worn into surgery.  Leave your suitcase in the car. After surgery it may be brought to your room.  For patients admitted to the hospital, discharge time is determined by your                       treatment team.   Patients discharged the day of surgery will not be allowed to drive home.  You will need someone to drive you home and stay with you the night of your procedure.    Please read over the following fact sheets that you were given:   [facility] Preparing for Surgery and or MRSA Information   _x___ Take anti-hypertensive (unless it includes a diuretic), cardiac, seizure, asthma,     anti-reflux and psychiatric medicines. These include:  1.  PROTONIX  2.  3.  4.  5.  6.  ____Fleets enema or Magnesium Citrate as directed.   ____ Use CHG Soap or sage wipes as directed on instruction sheet   ____ Use inhalers on the day of surgery and bring to hospital day of surgery  ____ Stop Metformin and Janumet 2 days prior to surgery.    ____ Take 1/2 of usual insulin dose the night before surgery and none on the morning     surgery.   ____ Follow recommendations from Cardiologist, Pulmonologist or PCP regarding          stopping Aspirin, Coumadin, Pllavix ,Eliquis, Effient, or Pradaxa, and Pletal.  X____Stop Anti-inflammatories such as Advil, Aleve, Ibuprofen, Motrin, Naproxen, Naprosyn, Goodies powders or aspirin products NOW-OK to take Tylenol   ____ Stop supplements until after surgery.     ____ Bring C-Pap to the hospital.

## 2016-07-13 ENCOUNTER — Other Ambulatory Visit: Payer: Self-pay

## 2016-07-17 MED ORDER — CEFAZOLIN SODIUM-DEXTROSE 2-4 GM/100ML-% IV SOLN
2.0000 g | INTRAVENOUS | Status: AC
  Administered 2016-07-18: 2 g via INTRAVENOUS

## 2016-07-18 ENCOUNTER — Ambulatory Visit
Admission: RE | Admit: 2016-07-18 | Discharge: 2016-07-18 | Disposition: A | Discharge status: Home / Self Care | Payer: BLUE CROSS/BLUE SHIELD | Source: Ambulatory Visit | Attending: Surgery | Admitting: Surgery

## 2016-07-18 ENCOUNTER — Ambulatory Visit: Payer: BLUE CROSS/BLUE SHIELD | Admitting: Anesthesiology

## 2016-07-18 ENCOUNTER — Encounter
Admission: RE | Disposition: A | Discharge status: Home / Self Care | Payer: Self-pay | Source: Ambulatory Visit | Attending: Surgery

## 2016-07-18 ENCOUNTER — Encounter: Payer: Self-pay | Admitting: *Deleted

## 2016-07-18 DIAGNOSIS — K219 Gastro-esophageal reflux disease without esophagitis: Secondary | ICD-10-CM | POA: Diagnosis not present

## 2016-07-18 DIAGNOSIS — Z882 Allergy status to sulfonamides status: Secondary | ICD-10-CM | POA: Diagnosis not present

## 2016-07-18 DIAGNOSIS — E559 Vitamin D deficiency, unspecified: Secondary | ICD-10-CM | POA: Diagnosis not present

## 2016-07-18 DIAGNOSIS — Z801 Family history of malignant neoplasm of trachea, bronchus and lung: Secondary | ICD-10-CM | POA: Insufficient documentation

## 2016-07-18 DIAGNOSIS — Z79899 Other long term (current) drug therapy: Secondary | ICD-10-CM | POA: Diagnosis not present

## 2016-07-18 DIAGNOSIS — Z8049 Family history of malignant neoplasm of other genital organs: Secondary | ICD-10-CM | POA: Insufficient documentation

## 2016-07-18 DIAGNOSIS — Z8249 Family history of ischemic heart disease and other diseases of the circulatory system: Secondary | ICD-10-CM | POA: Insufficient documentation

## 2016-07-18 DIAGNOSIS — Z803 Family history of malignant neoplasm of breast: Secondary | ICD-10-CM | POA: Diagnosis not present

## 2016-07-18 DIAGNOSIS — E7800 Pure hypercholesterolemia, unspecified: Secondary | ICD-10-CM | POA: Insufficient documentation

## 2016-07-18 DIAGNOSIS — Z9889 Other specified postprocedural states: Secondary | ICD-10-CM | POA: Insufficient documentation

## 2016-07-18 DIAGNOSIS — Z833 Family history of diabetes mellitus: Secondary | ICD-10-CM | POA: Diagnosis not present

## 2016-07-18 DIAGNOSIS — Z8349 Family history of other endocrine, nutritional and metabolic diseases: Secondary | ICD-10-CM | POA: Diagnosis not present

## 2016-07-18 DIAGNOSIS — K828 Other specified diseases of gallbladder: Secondary | ICD-10-CM

## 2016-07-18 HISTORY — PX: CHOLECYSTECTOMY, LAPAROSCOPIC: SHX56

## 2016-07-18 HISTORY — PX: CHOLECYSTECTOMY: SHX55

## 2016-07-18 LAB — POCT PREGNANCY, URINE: Preg Test, Ur: NEGATIVE

## 2016-07-18 SURGERY — LAPAROSCOPIC CHOLECYSTECTOMY
Anesthesia: General | Wound class: Clean Contaminated

## 2016-07-18 MED ORDER — ROCURONIUM BROMIDE 100 MG/10ML IV SOLN
INTRAVENOUS | Status: DC | PRN
  Administered 2016-07-18: 50 mg via INTRAVENOUS

## 2016-07-18 MED ORDER — FENTANYL CITRATE (PF) 100 MCG/2ML IJ SOLN
INTRAMUSCULAR | Status: AC
  Filled 2016-07-18: qty 2

## 2016-07-18 MED ORDER — ACETAMINOPHEN 10 MG/ML IV SOLN
INTRAVENOUS | Status: DC | PRN
  Administered 2016-07-18: 1000 mg via INTRAVENOUS

## 2016-07-18 MED ORDER — PROMETHAZINE HCL 25 MG/ML IJ SOLN: 6.2500 mg | INTRAMUSCULAR | Status: DC | PRN

## 2016-07-18 MED ORDER — FENTANYL CITRATE (PF) 100 MCG/2ML IJ SOLN
25.0000 ug | INTRAMUSCULAR | Status: DC | PRN
  Administered 2016-07-18 (×5): 25 ug via INTRAVENOUS

## 2016-07-18 MED ORDER — SUGAMMADEX SODIUM 200 MG/2ML IV SOLN
INTRAVENOUS | Status: AC
  Filled 2016-07-18: qty 2

## 2016-07-18 MED ORDER — MIDAZOLAM HCL 2 MG/2ML IJ SOLN
INTRAMUSCULAR | Status: AC
  Filled 2016-07-18: qty 2

## 2016-07-18 MED ORDER — SCOPOLAMINE 1 MG/3DAYS TD PT72
MEDICATED_PATCH | TRANSDERMAL | Status: AC
  Filled 2016-07-18: qty 1

## 2016-07-18 MED ORDER — KETOROLAC TROMETHAMINE 30 MG/ML IJ SOLN
INTRAMUSCULAR | Status: AC
  Filled 2016-07-18: qty 1

## 2016-07-18 MED ORDER — OXYCODONE HCL 5 MG PO TABS: 5.0000 mg | ORAL_TABLET | Freq: Once | ORAL | Status: DC | PRN

## 2016-07-18 MED ORDER — BUPIVACAINE-EPINEPHRINE 0.25% -1:200000 IJ SOLN
INTRAMUSCULAR | Status: DC | PRN
  Administered 2016-07-18: 30 mL

## 2016-07-18 MED ORDER — PROPOFOL 10 MG/ML IV BOLUS
INTRAVENOUS | Status: AC
  Filled 2016-07-18: qty 20

## 2016-07-18 MED ORDER — FENTANYL CITRATE (PF) 100 MCG/2ML IJ SOLN
INTRAMUSCULAR | Status: AC
  Administered 2016-07-18: 25 ug via INTRAVENOUS
  Filled 2016-07-18: qty 2

## 2016-07-18 MED ORDER — LACTATED RINGERS IV SOLN
INTRAVENOUS | Status: DC
  Administered 2016-07-18: 09:00:00 via INTRAVENOUS

## 2016-07-18 MED ORDER — ONDANSETRON HCL 4 MG PO TABS: 4.0000 mg | ORAL_TABLET | Freq: Three times a day (TID) | ORAL | 0 refills | Status: DC | PRN

## 2016-07-18 MED ORDER — DEXAMETHASONE SODIUM PHOSPHATE 10 MG/ML IJ SOLN
INTRAMUSCULAR | Status: AC
  Filled 2016-07-18: qty 1

## 2016-07-18 MED ORDER — CEFAZOLIN SODIUM-DEXTROSE 2-4 GM/100ML-% IV SOLN
INTRAVENOUS | Status: AC
  Administered 2016-07-18: 2 g via INTRAVENOUS
  Filled 2016-07-18: qty 100

## 2016-07-18 MED ORDER — SODIUM CHLORIDE 0.9 % IJ SOLN
INTRAMUSCULAR | Status: AC
  Filled 2016-07-18: qty 50

## 2016-07-18 MED ORDER — BUPIVACAINE-EPINEPHRINE (PF) 0.25% -1:200000 IJ SOLN
INTRAMUSCULAR | Status: AC
  Filled 2016-07-18: qty 30

## 2016-07-18 MED ORDER — HYDROCODONE-ACETAMINOPHEN 5-325 MG PO TABS: 1.0000 | ORAL_TABLET | ORAL | 0 refills | Status: DC | PRN

## 2016-07-18 MED ORDER — OXYCODONE HCL 5 MG/5ML PO SOLN: 5.0000 mg | Freq: Once | ORAL | Status: DC | PRN

## 2016-07-18 MED ORDER — ROCURONIUM BROMIDE 50 MG/5ML IV SOLN
INTRAVENOUS | Status: AC
  Filled 2016-07-18: qty 1

## 2016-07-18 MED ORDER — MEPERIDINE HCL 50 MG/ML IJ SOLN: 6.2500 mg | INTRAMUSCULAR | Status: DC | PRN

## 2016-07-18 MED ORDER — PROPOFOL 10 MG/ML IV BOLUS
INTRAVENOUS | Status: DC | PRN
  Administered 2016-07-18: 150 mg via INTRAVENOUS

## 2016-07-18 MED ORDER — SODIUM CHLORIDE 0.9 % IJ SOLN
INTRAMUSCULAR | Status: AC
  Filled 2016-07-18: qty 10

## 2016-07-18 MED ORDER — CHLORHEXIDINE GLUCONATE CLOTH 2 % EX PADS: 6.0000 | MEDICATED_PAD | Freq: Once | CUTANEOUS | Status: DC

## 2016-07-18 MED ORDER — ONDANSETRON HCL 4 MG/2ML IJ SOLN
INTRAMUSCULAR | Status: DC | PRN
Start: 2016-07-18 — End: 2016-07-18
  Administered 2016-07-18: 4 mg via INTRAVENOUS

## 2016-07-18 MED ORDER — FENTANYL CITRATE (PF) 100 MCG/2ML IJ SOLN
INTRAMUSCULAR | Status: DC | PRN
  Administered 2016-07-18: 50 ug via INTRAVENOUS
  Administered 2016-07-18: 100 ug via INTRAVENOUS
  Administered 2016-07-18: 50 ug via INTRAVENOUS

## 2016-07-18 MED ORDER — SUGAMMADEX SODIUM 200 MG/2ML IV SOLN
INTRAVENOUS | Status: DC | PRN
  Administered 2016-07-18: 130 mg via INTRAVENOUS

## 2016-07-18 MED ORDER — DEXAMETHASONE SODIUM PHOSPHATE 10 MG/ML IJ SOLN
INTRAMUSCULAR | Status: DC | PRN
  Administered 2016-07-18: 10 mg via INTRAVENOUS

## 2016-07-18 MED ORDER — KETOROLAC TROMETHAMINE 30 MG/ML IJ SOLN
INTRAMUSCULAR | Status: DC | PRN
  Administered 2016-07-18: 30 mg via INTRAVENOUS

## 2016-07-18 MED ORDER — MIDAZOLAM HCL 2 MG/2ML IJ SOLN
INTRAMUSCULAR | Status: DC | PRN
  Administered 2016-07-18: 2 mg via INTRAVENOUS

## 2016-07-18 MED ORDER — ACETAMINOPHEN 10 MG/ML IV SOLN
INTRAVENOUS | Status: AC
Start: 2016-07-18 — End: 2016-07-18
  Filled 2016-07-18: qty 100

## 2016-07-18 MED ORDER — EPHEDRINE SULFATE 50 MG/ML IJ SOLN
INTRAMUSCULAR | Status: AC
  Filled 2016-07-18: qty 1

## 2016-07-18 MED ORDER — ONDANSETRON HCL 4 MG/2ML IJ SOLN
INTRAMUSCULAR | Status: AC
  Filled 2016-07-18: qty 2

## 2016-07-18 MED ORDER — SCOPOLAMINE 1 MG/3DAYS TD PT72
1.0000 | MEDICATED_PATCH | TRANSDERMAL | Status: DC
  Administered 2016-07-18: 1.5 mg via TRANSDERMAL

## 2016-07-18 SURGICAL SUPPLY — 46 items
APPLICATOR COTTON TIP 6IN STRL (MISCELLANEOUS) IMPLANT
APPLIER CLIP 5 13 M/L LIGAMAX5 (MISCELLANEOUS) ×2
BLADE SURG 15 STRL LF DISP TIS (BLADE) ×1 IMPLANT
BLADE SURG 15 STRL SS (BLADE) ×1
CANISTER SUCT 1200ML W/VALVE (MISCELLANEOUS) ×2 IMPLANT
CHLORAPREP W/TINT 26ML (MISCELLANEOUS) ×2 IMPLANT
CHOLANGIOGRAM CATH TAUT (CATHETERS) IMPLANT
CLEANER CAUTERY TIP 5X5 PAD (MISCELLANEOUS) IMPLANT
CLIP APPLIE 5 13 M/L LIGAMAX5 (MISCELLANEOUS) ×1 IMPLANT
DECANTER SPIKE VIAL GLASS SM (MISCELLANEOUS) IMPLANT
DEVICE TROCAR PUNCTURE CLOSURE (ENDOMECHANICALS) IMPLANT
DRAPE C-ARM XRAY 36X54 (DRAPES) IMPLANT
ELECT CAUTERY BLADE 6.4 (BLADE) ×2 IMPLANT
ELECT REM PT RETURN 9FT ADLT (ELECTROSURGICAL) ×2
ELECTRODE REM PT RTRN 9FT ADLT (ELECTROSURGICAL) ×1 IMPLANT
ENDOPOUCH RETRIEVER 10 (MISCELLANEOUS) ×2 IMPLANT
GLOVE BIO SURGEON STRL SZ7 (GLOVE) ×12 IMPLANT
GOWN STRL REUS W/ TWL LRG LVL3 (GOWN DISPOSABLE) ×4 IMPLANT
GOWN STRL REUS W/TWL LRG LVL3 (GOWN DISPOSABLE) ×4
IRRIGATION STRYKERFLOW (MISCELLANEOUS) ×1 IMPLANT
IRRIGATOR STRYKERFLOW (MISCELLANEOUS) ×2
IV CATH ANGIO 12GX3 LT BLUE (NEEDLE) ×2 IMPLANT
IV NS 1000ML (IV SOLUTION) ×1
IV NS 1000ML BAXH (IV SOLUTION) ×1 IMPLANT
L-HOOK LAP DISP 36CM (ELECTROSURGICAL) ×2
LHOOK LAP DISP 36CM (ELECTROSURGICAL) ×1 IMPLANT
LIQUID BAND (GAUZE/BANDAGES/DRESSINGS) ×2 IMPLANT
NEEDLE HYPO 22GX1.5 SAFETY (NEEDLE) ×2 IMPLANT
PACK LAP CHOLECYSTECTOMY (MISCELLANEOUS) ×2 IMPLANT
PAD CLEANER CAUTERY TIP 5X5 (MISCELLANEOUS)
PENCIL ELECTRO HAND CTR (MISCELLANEOUS) ×2 IMPLANT
SCISSORS METZENBAUM CVD 33 (INSTRUMENTS) ×2 IMPLANT
SLEEVE ENDOPATH XCEL 5M (ENDOMECHANICALS) ×4 IMPLANT
SOL ANTI-FOG 6CC FOG-OUT (MISCELLANEOUS) ×1 IMPLANT
SOL FOG-OUT ANTI-FOG 6CC (MISCELLANEOUS) ×1
SPONGE LAP 18X18 5 PK (GAUZE/BANDAGES/DRESSINGS) ×2 IMPLANT
STOPCOCK 3 WAY  REPLAC (MISCELLANEOUS) IMPLANT
SUT ETHIBOND 0 MO6 C/R (SUTURE) IMPLANT
SUT MNCRL AB 4-0 PS2 18 (SUTURE) ×2 IMPLANT
SUT VIC AB 0 CT2 27 (SUTURE) IMPLANT
SUT VICRYL 0 AB UR-6 (SUTURE) ×4 IMPLANT
SYR 20CC LL (SYRINGE) ×2 IMPLANT
TROCAR XCEL BLUNT TIP 100MML (ENDOMECHANICALS) ×2 IMPLANT
TROCAR XCEL NON-BLD 5MMX100MML (ENDOMECHANICALS) ×2 IMPLANT
TUBING INSUFFLATOR HI FLOW (MISCELLANEOUS) ×2 IMPLANT
WATER STERILE IRR 1000ML POUR (IV SOLUTION) IMPLANT

## 2016-07-18 NOTE — Anesthesia Postprocedure Evaluation (Signed)
Anesthesia Post Note  Patient: Judy Munoz  Procedure(s) Performed: Procedure(s) (LRB): LAPAROSCOPIC CHOLECYSTECTOMY (N/A)  Patient location during evaluation: PACU Anesthesia Type: General Level of consciousness: awake and alert and oriented Pain management: pain level controlled Vital Signs Assessment: post-procedure vital signs reviewed and stable Respiratory status: spontaneous breathing, nonlabored ventilation and respiratory function stable Cardiovascular status: blood pressure returned to baseline and stable Postop Assessment: no signs of nausea or vomiting Anesthetic complications: no     Last Vitals:  Vitals:   07/18/16 1105 07/18/16 1110  BP: 118/71   Pulse: 64 69  Resp: 12 14  Temp:      Last Pain:  Vitals:   07/18/16 0810  TempSrc: Oral  PainSc: 0-No pain                 Khyla Mccumbers

## 2016-07-18 NOTE — Op Note (Signed)
Laparoscopic Cholecystectomy  Pre-operative Diagnosis: Biliary dyskinesia  Post-operative Diagnosis: same  Surgeon: Caroleen Hamman, MD FACS  Anesthesia: Gen. with endotracheal tube  Findings: GB   Estimated Blood Loss: 5cc                Specimens: Gallbladder           Complications: none   Procedure Details  The patient was seen again in the Holding Room. The benefits, complications, treatment options, and expected outcomes were discussed with the patient. The risks of bleeding, infection, recurrence of symptoms, failure to resolve symptoms, bile duct damage, bile duct leak, retained common bile duct stone, bowel injury, any of which could require further surgery and/or ERCP, stent, or papillotomy were reviewed with the patient. The likelihood of improving the patient's symptoms with return to their baseline status is good.  The patient and/or family concurred with the proposed plan, giving informed consent.  The patient was taken to Operating Room, identified as [name] and the procedure verified as Laparoscopic Cholecystectomy.  A Time Out was held and the above information confirmed.  Prior to the induction of general anesthesia, antibiotic prophylaxis was administered. VTE prophylaxis was in place. General endotracheal anesthesia was then administered and tolerated well. After the induction, the abdomen was prepped with Chloraprep and draped in the sterile fashion. The patient was positioned in the supine position.  Local anesthetic  was injected into the skin near the umbilicus and an incision made. Cut down technique was used to enter the abdominal cavity and a Hasson trochar was placed after two vicryl stitches were anchored to the fascia. Pneumoperitoneum was then created with CO2 and tolerated well without any adverse changes in the patient's vital signs.  Three 5-mm ports were placed in the right upper quadrant all under direct vision. All skin incisions  were infiltrated  with a local anesthetic agent before making the incision and placing the trocars.   The patient was positioned  in reverse Trendelenburg, tilted slightly to the patient's left.  The gallbladder was identified, the fundus grasped and retracted cephalad.  The infundibulum was grasped and retracted laterally, exposing the peritoneum overlying the triangle of Calot. This was then divided and exposed in a blunt fashion. An extended critical view of the cystic duct and cystic artery was obtained.  The cystic duct was clearly identified and bluntly dissected.   Artery and duct were double clipped and divided.  The gallbladder was taken from the gallbladder fossa in a retrograde fashion with the electrocautery. The gallbladder was removed and placed in an Endocatch bag. The liver bed was irrigated and inspected. Hemostasis was achieved with the electrocautery. Irrigation was utilized and was repeatedly aspirated until clear.  The gallbladder and Endocatch sac were then removed    Inspection of the right upper quadrant was performed. No bleeding, bile duct injury or leak, or bowel injury was noted. Pneumoperitoneum was released.  The periumbilical port site was closed with figure-of-eight 0 Vicryl sutures. 4-0 subcuticular Monocryl was used to close the skin. Dermabond was  applied.  The patient was then extubated and brought to the recovery room in stable condition. Sponge, lap, and needle counts were correct at closure and at the conclusion of the case.               Caroleen Hamman, MD, FACS

## 2016-07-18 NOTE — Anesthesia Post-op Follow-up Note (Cosign Needed)
Anesthesia QCDR form completed.        

## 2016-07-18 NOTE — Anesthesia Preprocedure Evaluation (Addendum)
Anesthesia Evaluation  Patient identified by MRN, date of birth, ID band Patient awake    Reviewed: Allergy & Precautions, NPO status , Patient's Chart, lab work & pertinent test results  History of Anesthesia Complications (+) PONV and history of anesthetic complications  Airway Mallampati: II  TM Distance: >3 FB Neck ROM: Full    Dental   Pulmonary neg pulmonary ROS, neg sleep apnea, neg COPD,    breath sounds clear to auscultation- rhonchi (-) wheezing      Cardiovascular Exercise Tolerance: Good (-) hypertension(-) CAD and (-) Past MI  Rhythm:Regular Rate:Normal - Systolic murmurs and - Diastolic murmurs    Neuro/Psych  Headaches, negative psych ROS   GI/Hepatic Neg liver ROS, GERD  ,  Endo/Other  negative endocrine ROSneg diabetes  Renal/GU negative Renal ROS     Musculoskeletal negative musculoskeletal ROS (+)   Abdominal (+) - obese,   Peds  Hematology  (+) anemia ,   Anesthesia Other Findings Past Medical History: No date: Anemia     Comment: H/O WITH PREGNANCY No date: Elevated glucose No date: GERD (gastroesophageal reflux disease) No date: Headache     Comment: MIGRAINES RARE No date: Heart murmur     Comment: WAS TOLD THIS ONCE YEARS AGO-HAS NEVER BEEN               TOLD SINCE No date: High cholesterol No date: History of uterine fibroid No date: IFG (impaired fasting glucose) No date: PONV (postoperative nausea and vomiting)     Comment: HAPPENED WITH HER 1ST EGD- HER 2ND EGD SHE WAS              GIVEN ANTI-EMETIC IV AND HAD NO N/V No date: Vitamin D deficiency   Reproductive/Obstetrics                             Anesthesia Physical Anesthesia Plan  ASA: II  Anesthesia Plan: General   Post-op Pain Management:    Induction: Intravenous  Airway Management Planned: Oral ETT  Additional Equipment:   Intra-op Plan:   Post-operative Plan: Extubation in  OR  Informed Consent: I have reviewed the patients History and Physical, chart, labs and discussed the procedure including the risks, benefits and alternatives for the proposed anesthesia with the patient or authorized representative who has indicated his/her understanding and acceptance.   Dental advisory given  Plan Discussed with: CRNA and Anesthesiologist  Anesthesia Plan Comments:         Anesthesia Quick Evaluation

## 2016-07-18 NOTE — Anesthesia Procedure Notes (Signed)
Procedure Name: Intubation Date/Time: 07/18/2016 9:36 AM Performed by: Jonna Clark Pre-anesthesia Checklist: Patient identified, Patient being monitored, Timeout performed, Emergency Drugs available and Suction available Patient Re-evaluated:Patient Re-evaluated prior to inductionOxygen Delivery Method: Circle system utilized Preoxygenation: Pre-oxygenation with 100% oxygen Intubation Type: IV induction Ventilation: Mask ventilation without difficulty Laryngoscope Size: Miller and 2 Grade View: Grade I Tube type: Oral Tube size: 7.0 mm Number of attempts: 1 Placement Confirmation: ETT inserted through vocal cords under direct vision,  positive ETCO2 and breath sounds checked- equal and bilateral Secured at: 21 cm Tube secured with: Tape Dental Injury: Teeth and Oropharynx as per pre-operative assessment

## 2016-07-18 NOTE — Interval H&P Note (Signed)
History and Physical Interval Note:  07/18/2016 9:19 AM  Judy Munoz  has presented today for surgery, with the diagnosis of Biliary dyskinesia  The various methods of treatment have been discussed with the patient and family. After consideration of risks, benefits and other options for treatment, the patient has consented to  Procedure(s): LAPAROSCOPIC CHOLECYSTECTOMY (N/A) as a surgical intervention .  The patient's history has been reviewed, patient examined, no change in status, stable for surgery.  I have reviewed the patient's chart and labs.  Questions were answered to the patient's satisfaction.     [place]

## 2016-07-18 NOTE — Discharge Instructions (Signed)
  AMBULATORY SURGERY  DISCHARGE INSTRUCTIONS   1) The drugs that you were given will stay in your system until tomorrow so for the next 24 hours you should not:  A) Drive an automobile B) Make any legal decisions C) Drink any alcoholic beverage   2) You may resume regular meals tomorrow.  Today it is better to start with liquids and gradually work up to solid foods.  You may eat anything you prefer, but it is better to start with liquids, then soup and crackers, and gradually work up to solid foods.   3) Please notify your doctor immediately if you have any unusual bleeding, trouble breathing, redness and pain at the surgery site, drainage, fever, or pain not relieved by medication.    4) Additional Instructions: TAKE A STOOL SOFTENER TWICE A DAY WHILE TAKING NARCOTIC PAIN MEDICINE TO PREVENT CONSTIPATION   Please contact your physician with any problems or Same Day Surgery at [phone], Monday through Friday 6 am to 4 pm, or [place] [facility] Main number at [phone].   

## 2016-07-18 NOTE — H&P (View-Only) (Signed)
Patient ID: Judy Munoz, female   DOB: 1971/04/04, 46 y.o.   MRN: [id number]  HPI Judy Munoz is a 46 y.o. female referred by Dr.Wohl for biliary dyskinesia. She has had at least 2 month history of right upper quadrant pain that is intermittent moderate in intensity and dull in nature. The pain is exacerbated by heavy meals. At there is no specific out of eating factors. She experiences some nausea but no vomiting. No fevers no chills no evidence of biliary obstruction.  Her workup have included a right upper quadrant ultrasound that I have personally reviewed showing normal gallbladder without stones, normal common bile duct. Her LFTs are normal. She did have an EGD Dr. wall that was premarked unremarkable. And her HIDA scan show evidence of decreased ejection fraction with reproduction of symptoms. She has very good cardiovascular reserve and is able to perform more than 4 Mets without shortness of breath or chest pain. She has never had any abdominal operations before. Both parents have had their gallbladders removed  HPI  Past Medical History:  Diagnosis Date  . Elevated glucose   . GERD (gastroesophageal reflux disease)   . Heart murmur   . High cholesterol   . History of uterine fibroid   . IFG (impaired fasting glucose)   . PONV (postoperative nausea and vomiting)   . Vitamin D deficiency     Past Surgical History:  Procedure Laterality Date  . ESOPHAGOGASTRODUODENOSCOPY    . ESOPHAGOGASTRODUODENOSCOPY (EGD) WITH PROPOFOL N/A 07/26/2015   Procedure: ESOPHAGOGASTRODUODENOSCOPY (EGD) WITH PROPOFOL;  Surgeon: Lucilla Lame, MD;  Location: [facility];  Service: Endoscopy;  Laterality: N/A;    Family History  Problem Relation Age of Onset  . Diabetes Father   . Heart disease Father   . Hypertension Father   . Hyperlipidemia Father   . Healthy Mother   . Cancer Maternal Grandmother     uterine  . Breast cancer Maternal Grandmother 75  . Lung cancer Maternal Uncle      Social History Social History  Substance Use Topics  . Smoking status: Never Smoker  . Smokeless tobacco: Never Used  . Alcohol use No    Allergies  Allergen Reactions  . Sulfa Antibiotics Hives    Current Outpatient Prescriptions  Medication Sig Dispense Refill  . cetirizine (ZYRTEC) 10 MG tablet Take 10 mg by mouth daily.    . cholecalciferol (VITAMIN D) 1000 UNITS tablet Take 1,000 Units by mouth daily.    . pantoprazole (PROTONIX) 40 MG tablet Take 1 tablet (40 mg total) by mouth 2 (two) times daily. 60 tablet 6  . pravastatin (PRAVACHOL) 20 MG tablet Take 1 tablet (20 mg total) by mouth at bedtime. 90 tablet 1   No current facility-administered medications for this visit.      Review of Systems A 10 point review of systems was asked and was negative except for the information on the HPI  Physical Exam Blood pressure 123/79, pulse 66, temperature 98.3 F (36.8 C), temperature source Oral, height 5' 3"  (1.6 m), weight 64.4 kg (142 lb), last menstrual period 06/14/2016. CONSTITUTIONAL: NAD. EYES: Pupils are equal, round, and reactive to light, Sclera are non-icteric. EARS, NOSE, MOUTH AND THROAT: The oropharynx is clear. The oral mucosa is pink and moist. Hearing is intact to voice. LYMPH NODES:  Lymph nodes in the neck are normal. RESPIRATORY:  Lungs are clear. There is normal respiratory effort, with equal breath sounds bilaterally, and without pathologic use of accessory muscles. CARDIOVASCULAR: Heart  is regular without murmurs, gallops, or rubs. GI: The abdomen is soft, nontender, and nondistended. There are no palpable masses. There is no hepatosplenomegaly. There are normal bowel sounds in all quadrants. GU: Rectal deferred.   MUSCULOSKELETAL: Normal muscle strength and tone. No cyanosis or edema.   SKIN: Turgor is good and there are no pathologic skin lesions or ulcers. NEUROLOGIC: Motor and sensation is grossly normal. Cranial nerves are grossly intact. PSYCH:   Oriented to person, place and time. Affect is normal.  Data Reviewed  I have personally reviewed the patient's imaging, laboratory findings and medical records.    Assessment/Plan 46 yo w biliary dyskinesia. D/W the pt in detail about her disease process. So far all her GI w/u has been negative and she did experience similar sxs when she had her HIDA scan. I do think that she will benefit from cholecystectomy. The risks, benefits, complications, treatment options, and expected outcomes were discussed with the patient. The possibilities of bleeding, recurrent infection, finding a normal gallbladder, perforation of viscus organs, damage to surrounding structures, bile leak, abscess formation, needing a drain placed, the need for additional procedures, reaction to medication, pulmonary aspiration,  failure to diagnose a condition, the possible need to convert to an open procedure, and creating a complication requiring transfusion or operation were discussed with the patient. The patient and/or family concurred with the proposed plan, giving informed consent.   Caroleen Hamman, MD FACS General Surgeon 07/05/2016, 9:51 AM

## 2016-07-18 NOTE — Transfer of Care (Signed)
Immediate Anesthesia Transfer of Care Note  Patient: DORE OQUIN  Procedure(s) Performed: Procedure(s): LAPAROSCOPIC CHOLECYSTECTOMY (N/A)  Patient Location: PACU  Anesthesia Type:General  Level of Consciousness: sedated and responds to stimulation  Airway & Oxygen Therapy: Patient Spontanous Breathing and Patient connected to face mask oxygen  Post-op Assessment: Report given to RN and Post -op Vital signs reviewed and stable  Post vital signs: Reviewed and stable  Last Vitals:  Vitals:   07/18/16 1044 07/18/16 1045  BP: 118/74   Pulse: 73 74  Resp: 14 15  Temp: (!) 36 C     Last Pain:  Vitals:   07/18/16 0810  TempSrc: Oral  PainSc: 0-No pain         Complications: No apparent anesthesia complications

## 2016-07-18 NOTE — Anesthesia Preprocedure Evaluation (Signed)
Anesthesia Evaluation  Patient identified by MRN, date of birth, ID band Patient awake    Reviewed: Allergy & Precautions, NPO status , Patient's Chart, lab work & pertinent test results  History of Anesthesia Complications (+) PONV and history of anesthetic complications  Airway Mallampati: II  TM Distance: >3 FB Neck ROM: Full    Dental no notable dental hx.    Pulmonary neg pulmonary ROS, neg sleep apnea, neg COPD,    breath sounds clear to auscultation- rhonchi (-) wheezing      Cardiovascular Exercise Tolerance: Good (-) hypertension(-) CAD and (-) Past MI  Rhythm:Regular Rate:Normal - Systolic murmurs and - Diastolic murmurs    Neuro/Psych  Headaches, negative psych ROS   GI/Hepatic Neg liver ROS, GERD  ,  Endo/Other  negative endocrine ROSneg diabetes  Renal/GU negative Renal ROS     Musculoskeletal negative musculoskeletal ROS (+)   Abdominal (+) - obese,   Peds  Hematology  (+) anemia ,   Anesthesia Other Findings Past Medical History: No date: Anemia     Comment: H/O WITH PREGNANCY No date: Elevated glucose No date: GERD (gastroesophageal reflux disease) No date: Headache     Comment: MIGRAINES RARE No date: Heart murmur     Comment: WAS TOLD THIS ONCE YEARS AGO-HAS NEVER BEEN               TOLD SINCE No date: High cholesterol No date: History of uterine fibroid No date: IFG (impaired fasting glucose) No date: PONV (postoperative nausea and vomiting)     Comment: HAPPENED WITH HER 1ST EGD- HER 2ND EGD SHE WAS              GIVEN ANTI-EMETIC IV AND HAD NO N/V No date: Vitamin D deficiency   Reproductive/Obstetrics                             Anesthesia Physical Anesthesia Plan  ASA: II  Anesthesia Plan: General   Post-op Pain Management:    Induction: Intravenous  Airway Management Planned: Oral ETT  Additional Equipment:   Intra-op Plan:   Post-operative  Plan: Extubation in OR  Informed Consent: I have reviewed the patients History and Physical, chart, labs and discussed the procedure including the risks, benefits and alternatives for the proposed anesthesia with the patient or authorized representative who has indicated his/her understanding and acceptance.   Dental advisory given  Plan Discussed with: CRNA and Anesthesiologist  Anesthesia Plan Comments:         Anesthesia Quick Evaluation

## 2016-07-19 ENCOUNTER — Encounter: Payer: Self-pay | Admitting: Surgery

## 2016-07-19 LAB — SURGICAL PATHOLOGY

## 2016-07-21 ENCOUNTER — Telehealth: Payer: Self-pay | Admitting: Surgery

## 2016-07-21 MED ORDER — HYDROCODONE-ACETAMINOPHEN 5-325 MG PO TABS: 1.0000 | ORAL_TABLET | Freq: Four times a day (QID) | ORAL | 0 refills | Status: DC | PRN

## 2016-07-21 NOTE — Telephone Encounter (Signed)
Patient only has 2 pain pills left. Still in a lot of pain. Please advise

## 2016-07-21 NOTE — Telephone Encounter (Signed)
Spoke with patient's mother at this time. Patient took her last pain pill at 0615am this morning but is trying to reserve pain medication currently. Patient has only experienced Nausea once and this was after taking pain medication on an empty stomach. Pain is incisional per patient. She denies fever and chills. She had her first 2 bowel movements since surgery this am after the use of Colace since surgery.   I explained that patient may use Miralax if needed and Dulcolax as needed to help with bowel movements. I have encouraged the use of Ibuprofen three times daily 679m. I spoke with Dr. PHampton Abbot We have refilled patient's Norco, 5/325- 1 tab q6h PRN. [place]  We will follow-up in office as scheduled. Patient will call back or go to the ED for continued pain. She verbalizes understanding of this.

## 2016-07-23 ENCOUNTER — Other Ambulatory Visit: Payer: Self-pay | Admitting: Gastroenterology

## 2016-07-23 DIAGNOSIS — K219 Gastro-esophageal reflux disease without esophagitis: Secondary | ICD-10-CM

## 2016-07-27 DIAGNOSIS — D649 Anemia, unspecified: Secondary | ICD-10-CM | POA: Insufficient documentation

## 2016-07-27 DIAGNOSIS — R7309 Other abnormal glucose: Secondary | ICD-10-CM | POA: Insufficient documentation

## 2016-07-27 DIAGNOSIS — R011 Cardiac murmur, unspecified: Secondary | ICD-10-CM | POA: Insufficient documentation

## 2016-08-01 ENCOUNTER — Encounter: Payer: Self-pay | Admitting: Surgery

## 2016-08-01 ENCOUNTER — Ambulatory Visit (INDEPENDENT_AMBULATORY_CARE_PROVIDER_SITE_OTHER): Payer: BLUE CROSS/BLUE SHIELD | Admitting: Surgery

## 2016-08-01 VITALS — BP 122/75 | HR 73 | Temp 97.6°F | Ht 63.0 in | Wt 145.0 lb

## 2016-08-01 DIAGNOSIS — Z09 Encounter for follow-up examination after completed treatment for conditions other than malignant neoplasm: Secondary | ICD-10-CM

## 2016-08-01 NOTE — Progress Notes (Signed)
08/01/2016  HPI: Patient is status post laparoscopic cholecystectomy with Dr. Dahlia Byes on 3/24 biliary dyskinesia. Patient presents today for postop follow-up. She had called on 3/23 with abdominal pain having run out of her pain medications and had a refill of her Vicodin. Now she reports that she's been doing very well with no significant pain and hasn't required Vicodin for a few days. She does take Tylenol or Motrin for some intermittent pain control but otherwise has not required significant narcotics. Denies any nausea or vomiting. Reports good appetite with bowel movements although initially had some constipation.  Vital signs: BP 122/75   Pulse 73   Temp 97.6 F (36.4 C) (Oral)   Ht 5' 3"  (1.6 m)   Wt 65.8 kg (145 lb)   LMP 07/07/2016 (Exact Date)   BMI 25.69 kg/m    Physical Exam: Constitutional: No acute distress Abdomen:  Soft, nondistended, nontender to palpation. All 4 incisions are clean dry and intact. The subxiphoid incision had a 3 mm length of Monocryl suture sticking out from the incision which was cut.  Assessment/Plan: 46 year old female status post laparoscopic cholecystectomy, currently healing well.  -Instructed the patient and she still has a restriction of no heavy lifting or pushing more than 10-15 pounds for a total of 4 weeks after the surgery. -Patient may submerge her wounds in pool or tub now. -Patient may follow-up with [place] on an as-needed basis.   Melvyn Neth, [place]

## 2016-08-01 NOTE — Patient Instructions (Signed)
No heavy lifting or straning activities for the next 4-6 weeks. (5/1-5/15)  Follow-up as needed.

## 2016-08-31 ENCOUNTER — Telehealth: Payer: Self-pay | Admitting: Obstetrics and Gynecology

## 2016-08-31 NOTE — Telephone Encounter (Signed)
Ordered on 03/2016 order is good for a year

## 2016-08-31 NOTE — Telephone Encounter (Signed)
Patient lvm requesting an order for right breast mammogram be sent to [facility]). Please advise.

## 2016-09-05 ENCOUNTER — Encounter: Payer: Self-pay | Admitting: Family Medicine

## 2016-09-05 ENCOUNTER — Ambulatory Visit (INDEPENDENT_AMBULATORY_CARE_PROVIDER_SITE_OTHER): Payer: BLUE CROSS/BLUE SHIELD | Admitting: Family Medicine

## 2016-09-05 VITALS — BP 136/83 | HR 82 | Temp 98.3°F | Wt 143.9 lb

## 2016-09-05 DIAGNOSIS — H60502 Unspecified acute noninfective otitis externa, left ear: Secondary | ICD-10-CM | POA: Diagnosis not present

## 2016-09-05 MED ORDER — NEOMYCIN-POLYMYXIN-HC 1 % OT SOLN: 3.0000 [drp] | Freq: Four times a day (QID) | OTIC | 0 refills | Status: DC

## 2016-09-05 NOTE — Patient Instructions (Addendum)
Otitis Externa Otitis externa is an infection of the outer ear canal. The outer ear canal is the area between the outside of the ear and the eardrum. Otitis externa is sometimes called "swimmer's ear." What are the causes? This condition may be caused by:  Swimming in dirty water.  Moisture in the ear.  An injury to the inside of the ear.  An object stuck in the ear.  A cut or scrape on the outside of the ear. What increases the risk? This condition is more likely to develop in swimmers. What are the signs or symptoms? The first symptom of this condition is often itching in the ear. Later signs and symptoms include:  Swelling of the ear.  Redness in the ear.  Ear pain. The pain may get worse when you pull on your ear.  Pus coming from the ear. How is this diagnosed? This condition may be diagnosed by examining the ear and testing fluid from the ear for bacteria and funguses. How is this treated? This condition may be treated with:  Antibiotic ear drops. These are often given for 10-14 days.  Medicine to reduce itching and swelling. Follow these instructions at home:  If you were prescribed antibiotic ear drops, apply them as told by your health care provider. Do not stop using the antibiotic even if your condition improves.  Take over-the-counter and prescription medicines only as told by your health care provider.  Keep all follow-up visits as told by your health care provider. This is important. How is this prevented?  Keep your ear dry. Use the corner of a towel to dry your ear after you swim or bathe.  Avoid scratching or putting things in your ear. Doing these things can damage the ear canal or remove the protective wax that lines it, which makes it easier for bacteria and funguses to grow.  Avoid swimming in lakes, polluted water, or pools that may not have the right amount of chlorine.  Consider making ear drops and putting 3 or 4 drops in each ear after you  swim. Ask your health care provider about how you can make ear drops. Contact a health care provider if:  You have a fever.  After 3 days your ear is still red, swollen, painful, or draining pus.  Your redness, swelling, or pain gets worse.  You have a severe headache.  You have redness, swelling, pain, or tenderness in the area behind your ear. This information is not intended to replace advice given to you by your health care provider. Make sure you discuss any questions you have with your health care provider. Document Released: 04/17/2005 Document Revised: 05/25/2015 Document Reviewed: 01/25/2015 Elsevier Interactive Patient Education  2017 [employer].

## 2016-09-05 NOTE — Progress Notes (Signed)
BP 136/83 (BP Location: Left Arm, Patient Position: Sitting, Cuff Size: Large)   Pulse 82   Temp 98.3 F (36.8 C)   Wt 143 lb 14.4 oz (65.3 kg)   SpO2 100%   BMI 25.49 kg/m    Subjective:    Patient ID: Judy Munoz, female    DOB: 1970-05-16, 46 y.o.   MRN: [id number]  HPI: MICHE LOUGHRIDGE is a 46 y.o. female  Chief Complaint  Patient presents with  . Ear Pain    Left   EAR PAIN Duration: 1 week Involved ear(s): left Severity:  severe  Quality:  Dull, constant pain Fever: no Otorrhea: yes Upper respiratory infection symptoms: no Pruritus: no Hearing loss: yes Water immersion no Using Q-tips: no Recurrent otitis media: no Status: worse Treatments attempted: zyrtec and tylenol   Relevant past medical, surgical, family and social history reviewed and updated as indicated. Interim medical history since our last visit reviewed. Allergies and medications reviewed and updated.  Review of Systems  Constitutional: Negative.   HENT: Positive for ear discharge and ear pain. Negative for congestion, dental problem, drooling, facial swelling, hearing loss and mouth sores.   Respiratory: Negative.   Cardiovascular: Negative.   Psychiatric/Behavioral: Negative.     Per HPI unless specifically indicated above     Objective:    BP 136/83 (BP Location: Left Arm, Patient Position: Sitting, Cuff Size: Large)   Pulse 82   Temp 98.3 F (36.8 C)   Wt 143 lb 14.4 oz (65.3 kg)   SpO2 100%   BMI 25.49 kg/m   Wt Readings from Last 3 Encounters:  09/05/16 143 lb 14.4 oz (65.3 kg)  08/01/16 145 lb (65.8 kg)  07/05/16 142 lb (64.4 kg)    Physical Exam  Constitutional: She is oriented to person, place, and time. She appears well-developed and well-nourished. No distress.  HENT:  Head: Normocephalic and atraumatic.  Right Ear: Hearing, tympanic membrane, external ear and ear canal normal.  Left Ear: Hearing and tympanic membrane normal.  Nose: Nose normal.    Mouth/Throat: Uvula is midline, oropharynx is clear and moist and mucous membranes are normal. No oropharyngeal exudate.  Pus in the L EAC  Eyes: Conjunctivae, EOM and lids are normal. Pupils are equal, round, and reactive to light. Right eye exhibits no discharge. Left eye exhibits no discharge. No scleral icterus.  Neck: Normal range of motion. Neck supple. No JVD present. No tracheal deviation present. No thyromegaly present.  Pulmonary/Chest: Effort normal. No stridor. No respiratory distress.  Musculoskeletal: Normal range of motion.  Lymphadenopathy:    She has cervical adenopathy.  Neurological: She is alert and oriented to person, place, and time.  Skin: Skin is warm, dry and intact. No rash noted. She is not diaphoretic. No erythema. No pallor.  Psychiatric: She has a normal mood and affect. Her speech is normal and behavior is normal. Judgment and thought content normal. Cognition and memory are normal.    Results for orders placed or performed during the hospital encounter of 07/18/16  Pregnancy, urine POC  Result Value Ref Range   Preg Test, Ur NEGATIVE NEGATIVE  Surgical pathology  Result Value Ref Range   SURGICAL PATHOLOGY      Surgical Pathology CASE: [id number] PATIENT: Durwin Nora Surgical Pathology Report     SPECIMEN SUBMITTED: A. Gallbladder  CLINICAL HISTORY: None provided  PRE-OPERATIVE DIAGNOSIS: Biliary dyskinesia  POST-OPERATIVE DIAGNOSIS: Same as pre-op     DIAGNOSIS: A. GALLBLADDER; CHOLECYSTECTOMY: - NO PATHOLOGIC  CHANGE. - UNREMARKABLE LYMPH NODE.   GROSS DESCRIPTION:  A. Labeled: gallbladder  Size of specimen: 6.0 x 2.3 x 1.5 cm  Previously opened: focally  External surface: wrinkled purple to green  Wall thickness: 0.3 cm  Mucosa: velvety red  Stones present: no  Other findings: cystic duct margin inked blue, also attached is a 0.9 x 0.5 x 0.3 cm lymph node candidate which is bisected  Block summary: 1 -  representative section, en face cystic duct margin and bisected lymph node    Final Diagnosis performed by Delorse Lek, MD.  Electronically signed 07/19/2016 3:38:05PM    The electronic signature indicates that the named Attending  Pathologist has evaluated the specimen  Technical component performed at [facility], [address], [place], [place] [address] Lab: [phone] Dir: Darrick Penna. Evette Doffing, MD  Professional component performed at [facility], [facility] [place], [place], [place] [address] Lab: [phone] Dir: Dellia Nims. Reuel Derby, MD        Assessment & Plan:   Problem List Items Addressed This Visit    None    Visit Diagnoses    Acute otitis externa of left ear, unspecified type    -  Primary   Will start polymyxin- Rx sent to her pharmacy. Call if not getting better or getting worse.        Follow up plan: Return if symptoms worsen or fail to improve.

## 2016-09-08 ENCOUNTER — Encounter: Payer: Self-pay | Admitting: Obstetrics and Gynecology

## 2016-09-08 ENCOUNTER — Other Ambulatory Visit: Payer: Self-pay

## 2016-09-08 DIAGNOSIS — Z1231 Encounter for screening mammogram for malignant neoplasm of breast: Secondary | ICD-10-CM

## 2016-09-13 ENCOUNTER — Other Ambulatory Visit: Payer: Self-pay

## 2016-09-13 ENCOUNTER — Encounter: Payer: Self-pay | Admitting: Family Medicine

## 2016-09-13 DIAGNOSIS — N6489 Other specified disorders of breast: Secondary | ICD-10-CM

## 2016-09-14 ENCOUNTER — Encounter: Payer: Self-pay | Admitting: Family Medicine

## 2016-09-14 ENCOUNTER — Ambulatory Visit (INDEPENDENT_AMBULATORY_CARE_PROVIDER_SITE_OTHER): Payer: BLUE CROSS/BLUE SHIELD | Admitting: Family Medicine

## 2016-09-14 VITALS — BP 114/66 | HR 68 | Temp 97.4°F | Wt 144.0 lb

## 2016-09-14 DIAGNOSIS — H6982 Other specified disorders of Eustachian tube, left ear: Secondary | ICD-10-CM | POA: Diagnosis not present

## 2016-09-14 MED ORDER — PREDNISONE 50 MG PO TABS: 50.0000 mg | ORAL_TABLET | Freq: Every day | ORAL | 0 refills | Status: DC

## 2016-09-14 NOTE — Progress Notes (Signed)
BP 114/66   Pulse 68   Temp 97.4 F (36.3 C) (Oral)   Wt 144 lb (65.3 kg)   SpO2 100%   BMI 25.51 kg/m    Subjective:    Patient ID: Judy Munoz, female    DOB: 1970/11/21, 46 y.o.   MRN: [id number]  HPI: Judy Munoz is a 46 y.o. female  Chief Complaint  Patient presents with  . Ear Pain   EAG CLOGGED Duration: weeks Involved ear(s):  left Sensation of feeling clogged/plugged: yes Decreased/muffled hearing:yes Ear pain: no Fever: no Otorrhea: no Hearing loss: no Upper respiratory infection symptoms: no Using Q-Tips: no Status: stable History of cerumenosis: no Treatments attempted: treatment for otitis externa- still not better  Relevant past medical, surgical, family and social history reviewed and updated as indicated. Interim medical history since our last visit reviewed. Allergies and medications reviewed and updated.  Review of Systems  Constitutional: Negative.   HENT: Positive for ear pain. Negative for congestion, dental problem, drooling, ear discharge, facial swelling, hearing loss, mouth sores, nosebleeds, postnasal drip, rhinorrhea, sinus pain, sinus pressure, sneezing, sore throat, tinnitus, trouble swallowing and voice change.   Respiratory: Negative.   Cardiovascular: Negative.   Psychiatric/Behavioral: Negative.     Per HPI unless specifically indicated above     Objective:    BP 114/66   Pulse 68   Temp 97.4 F (36.3 C) (Oral)   Wt 144 lb (65.3 kg)   SpO2 100%   BMI 25.51 kg/m   Wt Readings from Last 3 Encounters:  09/14/16 144 lb (65.3 kg)  09/05/16 143 lb 14.4 oz (65.3 kg)  08/01/16 145 lb (65.8 kg)    Physical Exam  Constitutional: She is oriented to person, place, and time. She appears well-developed and well-nourished. No distress.  HENT:  Head: Normocephalic and atraumatic.  Right Ear: Hearing and external ear normal.  Left Ear: Hearing normal.  Nose: Nose normal.  Mouth/Throat: Oropharynx is clear and moist. No  oropharyngeal exudate.  Resolving otitis externa on the L, TM normal on the R, retracted with serous fluid behind it on the L  Eyes: Conjunctivae, EOM and lids are normal. Pupils are equal, round, and reactive to light. Right eye exhibits no discharge. Left eye exhibits no discharge. No scleral icterus.  Neck: Normal range of motion. Neck supple. No JVD present. No tracheal deviation present. No thyromegaly present.  Cardiovascular: Normal rate, regular rhythm, normal heart sounds and intact distal pulses.  Exam reveals no gallop and no friction rub.   No murmur heard. Pulmonary/Chest: Effort normal and breath sounds normal. No stridor. No respiratory distress. She has no wheezes. She has no rales. She exhibits no tenderness.  Musculoskeletal: Normal range of motion.  Lymphadenopathy:    She has no cervical adenopathy.  Neurological: She is alert and oriented to person, place, and time.  Skin: Skin is warm, dry and intact. No rash noted. She is not diaphoretic. No erythema. No pallor.  Psychiatric: She has a normal mood and affect. Her speech is normal and behavior is normal. Judgment and thought content normal. Cognition and memory are normal.  Nursing note and vitals reviewed.   Results for orders placed or performed during the hospital encounter of 07/18/16  Pregnancy, urine POC  Result Value Ref Range   Preg Test, Ur NEGATIVE NEGATIVE  Surgical pathology  Result Value Ref Range   SURGICAL PATHOLOGY      Surgical Pathology CASE: [id number] PATIENT: Judy Munoz Surgical Pathology Report  SPECIMEN SUBMITTED: A. Gallbladder  CLINICAL HISTORY: None provided  PRE-OPERATIVE DIAGNOSIS: Biliary dyskinesia  POST-OPERATIVE DIAGNOSIS: Same as pre-op     DIAGNOSIS: A. GALLBLADDER; CHOLECYSTECTOMY: - NO PATHOLOGIC CHANGE. - UNREMARKABLE LYMPH NODE.   GROSS DESCRIPTION:  A. Labeled: gallbladder  Size of specimen: 6.0 x 2.3 x 1.5 cm  Previously opened:  focally  External surface: wrinkled purple to green  Wall thickness: 0.3 cm  Mucosa: velvety red  Stones present: no  Other findings: cystic duct margin inked blue, also attached is a 0.9 x 0.5 x 0.3 cm lymph node candidate which is bisected  Block summary: 1 - representative section, en face cystic duct margin and bisected lymph node    Final Diagnosis performed by Delorse Lek, MD.  Electronically signed 07/19/2016 3:38:05PM    The electronic signature indicates that the named Attending  Pathologist has evaluated the specimen  Technical component performed at [facility], [address] [place], [place] [address] Lab: [phone] Dir: Darrick Penna. Evette Doffing, MD  Professional component performed at [facility], [facility], [place], [place], [place] [address] Lab: [phone] Dir: Dellia Nims. Reuel Derby, MD        Assessment & Plan:   Problem List Items Addressed This Visit    None    Visit Diagnoses    Dysfunction of left eustachian tube    -  Primary   Will treat with prednisone burst, then start flonase. Call if not getting better and we will refer to ENT.        Follow up plan: Return if symptoms worsen or fail to improve.

## 2016-09-20 ENCOUNTER — Ambulatory Visit
Admission: EM | Admit: 2016-09-20 | Discharge: 2016-09-20 | Disposition: A | Discharge status: Home / Self Care | Payer: BLUE CROSS/BLUE SHIELD | Attending: Family Medicine | Admitting: Family Medicine

## 2016-09-20 DIAGNOSIS — J029 Acute pharyngitis, unspecified: Secondary | ICD-10-CM | POA: Diagnosis not present

## 2016-09-20 DIAGNOSIS — K1370 Unspecified lesions of oral mucosa: Secondary | ICD-10-CM

## 2016-09-20 DIAGNOSIS — H9202 Otalgia, left ear: Secondary | ICD-10-CM | POA: Diagnosis not present

## 2016-09-20 LAB — BASIC METABOLIC PANEL
ANION GAP: 6 (ref 5–15)
BUN: 11 mg/dL (ref 6–20)
CALCIUM: 9 mg/dL (ref 8.9–10.3)
CO2: 30 mmol/L (ref 22–32)
Chloride: 103 mmol/L (ref 101–111)
Creatinine, Ser: 0.76 mg/dL (ref 0.44–1.00)
Glucose, Bld: 118 mg/dL — ABNORMAL HIGH (ref 65–99)
Potassium: 3.7 mmol/L (ref 3.5–5.1)
Sodium: 139 mmol/L (ref 135–145)

## 2016-09-20 LAB — CBC WITH DIFFERENTIAL/PLATELET
BASOS ABS: 0.1 10*3/uL (ref 0–0.1)
BASOS PCT: 1 %
EOS ABS: 0 10*3/uL (ref 0–0.7)
Eosinophils Relative: 0 %
HCT: 38.5 % (ref 35.0–47.0)
HEMOGLOBIN: 12.9 g/dL (ref 12.0–16.0)
Lymphocytes Relative: 34 %
Lymphs Abs: 3.2 10*3/uL (ref 1.0–3.6)
MCH: 28.4 pg (ref 26.0–34.0)
MCHC: 33.6 g/dL (ref 32.0–36.0)
MCV: 84.5 fL (ref 80.0–100.0)
MONOS PCT: 6 %
Monocytes Absolute: 0.6 10*3/uL (ref 0.2–0.9)
NEUTROS ABS: 5.7 10*3/uL (ref 1.4–6.5)
NEUTROS PCT: 59 %
PLATELETS: 333 10*3/uL (ref 150–440)
RBC: 4.56 MIL/uL (ref 3.80–5.20)
RDW: 13.9 % (ref 11.5–14.5)
WBC: 9.6 10*3/uL (ref 3.6–11.0)

## 2016-09-20 LAB — RAPID STREP SCREEN (MED CTR MEBANE ONLY): Streptococcus, Group A Screen (Direct): NEGATIVE

## 2016-09-20 LAB — MONONUCLEOSIS SCREEN: MONO SCREEN: NEGATIVE

## 2016-09-20 MED ORDER — VALACYCLOVIR HCL 1 G PO TABS: 1000.0000 mg | ORAL_TABLET | Freq: Two times a day (BID) | ORAL | 0 refills | Status: AC

## 2016-09-20 MED ORDER — FIRST-DUKES MOUTHWASH MT SUSP: OROMUCOSAL | 0 refills | Status: DC

## 2016-09-20 NOTE — Discharge Instructions (Signed)
Take medication as prescribed. Rest. Drink plenty of fluids.   Follow up with your primary care physician or Ear Nose and Throat this week as needed. Return to Urgent care for new or worsening concerns.

## 2016-09-20 NOTE — ED Provider Notes (Signed)
[facility] ____________________________________________  Time seen: Approximately 1229 PM  I have reviewed the triage vital signs and the nursing notes.   HISTORY  Chief Complaint Sore Throat   HPI Judy Munoz is a 46 y.o. female present for evaluation of left ear discomfort, sore throat and lesion to the inside of her mouth. Patient states that overall symptoms of an present for 3 weeks. Patient states that 3 weeks ago she was seen by her primary physician prescribed ear drops for external otitis. Patient states that she then had follow-up as her symptoms continued and was told she had fluid present to her ear and was prescribed oral prednisone. Patient states that symptoms have continued prompting her to come in today. Patient reports the last 2 days she's also had some nasal drainage and slight sore throat and has noticed white spots inside of her mouth. Patient states the entire time she is felt like she has had some lymph node swelling in her neck. Denies any difficulty swallowing, lip, tongue or oral swelling. Denies fevers. Reports has continued to eat and drink well. Denies known sick contacts or others in the household with similar. Patient states that she has not had any laboratory work evaluating the same complaints. Denies history of similar. Denies history of HIV, states that she is borderline diabetic. Denies any other recent medication changes.  Denies chest pain, shortness of breath, abdominal pain, dysuria, extremity pain, extremity swelling or rash.  Patient's last menstrual period was 09/16/2016. Denies pregnancy Valerie Roys, DO: PCP   Past Medical History:  Diagnosis Date  . Anemia    H/O WITH PREGNANCY  . Elevated glucose   . GERD (gastroesophageal reflux disease)   . Headache    MIGRAINES RARE  . Heart murmur    WAS TOLD THIS ONCE YEARS AGO-HAS NEVER BEEN TOLD SINCE  . High cholesterol   . History of uterine fibroid   . IFG (impaired  fasting glucose)   . PONV (postoperative nausea and vomiting)    HAPPENED WITH HER 1ST EGD- HER 2ND EGD SHE WAS GIVEN ANTI-EMETIC IV AND HAD NO N/V  . Vitamin D deficiency     Patient Active Problem List   Diagnosis Date Noted  . Anemia   . Elevated glucose   . Heart murmur   . Gastritis   . Overweight (BMI 25.0-29.9) 03/23/2015  . GERD (gastroesophageal reflux disease)   . High cholesterol   . Vitamin D deficiency   . IFG (impaired fasting glucose)     Past Surgical History:  Procedure Laterality Date  . CHOLECYSTECTOMY N/A 07/18/2016   Procedure: LAPAROSCOPIC CHOLECYSTECTOMY;  Surgeon: Jules Husbands, MD;  Location: [facility];  Service: General;  Laterality: N/A;  . ESOPHAGOGASTRODUODENOSCOPY    . ESOPHAGOGASTRODUODENOSCOPY (EGD) WITH PROPOFOL N/A 07/26/2015   Procedure: ESOPHAGOGASTRODUODENOSCOPY (EGD) WITH PROPOFOL;  Surgeon: Lucilla Lame, MD;  Location: [facility];  Service: Endoscopy;  Laterality: N/A;     No current facility-administered medications for this encounter.   Current Outpatient Prescriptions:  .  acetaminophen (TYLENOL) 500 MG tablet, Take 500 mg by mouth every 6 (six) hours as needed for mild pain., Disp: , Rfl:  .  cetirizine (ZYRTEC) 10 MG tablet, Take 10 mg by mouth daily., Disp: , Rfl:  .  cholecalciferol (VITAMIN D) 1000 UNITS tablet, Take 1,000 Units by mouth daily., Disp: , Rfl:  .  NEOMYCIN-POLYMYXIN-HYDROCORTISONE (CORTISPORIN) 1 % SOLN otic solution, Place 3 drops into the left ear 4 (four) times  daily., Disp: 10 mL, Rfl: 0 .  pantoprazole (PROTONIX) 40 MG tablet, TAKE 1 TABLET (40 MG TOTAL) BY MOUTH 2 (TWO) TIMES DAILY., Disp: 60 tablet, Rfl: 6 .  pravastatin (PRAVACHOL) 20 MG tablet, Take 1 tablet (20 mg total) by mouth at bedtime., Disp: 90 tablet, Rfl: 1 .  predniSONE (DELTASONE) 50 MG tablet, Take 1 tablet (50 mg total) by mouth daily with breakfast., Disp: 5 tablet, Rfl: 0 .  Diphenhyd-Hydrocort-Nystatin (FIRST-DUKES MOUTHWASH) SUSP,  84ms three times daily swish and spit., Disp: 300 mL, Rfl: 0 .  valACYclovir (VALTREX) 1000 MG tablet, Take 1 tablet (1,000 mg total) by mouth 2 (two) times daily., Disp: 14 tablet, Rfl: 0  Allergies Sulfa antibiotics  Family History  Problem Relation Age of Onset  . Diabetes Father   . Heart disease Father   . Hypertension Father   . Hyperlipidemia Father   . Healthy Mother   . Cancer Maternal Grandmother        uterine  . Breast cancer Maternal Grandmother 583 . Lung cancer Maternal Uncle     Social History Social History  Substance Use Topics  . Smoking status: Never Smoker  . Smokeless tobacco: Never Used  . Alcohol use No    Review of Systems Constitutional: No fever/chills Eyes: No visual changes. ENT: As above. Cardiovascular: Denies chest pain. Respiratory: Denies shortness of breath. Gastrointestinal: No abdominal pain.  No nausea, no vomiting.  No diarrhea.  No constipation. Genitourinary: Negative for dysuria. Musculoskeletal: Negative for back pain. Skin: Negative for rash.   ____________________________________________   PHYSICAL EXAM:  VITAL SIGNS: ED Triage Vitals  Enc Vitals Group     BP 09/20/16 1139 136/84     Pulse Rate 09/20/16 1139 65     Resp 09/20/16 1139 18     Temp 09/20/16 1139 98 F (36.7 C)     Temp Source 09/20/16 1139 Oral     SpO2 09/20/16 1139 100 %     Weight 09/20/16 1140 144 lb (65.3 kg)     Height --      Head Circumference --      Peak Flow --      Pain Score 09/20/16 1140 3     Pain Loc --      Pain Edu? --      Excl. in [place] --     Constitutional: Alert and oriented. Well appearing and in no acute distress. Eyes: Conjunctivae are normal. PERRL. EOMI. Head: Atraumatic. No sinus tenderness to palpation. No swelling. No erythema.No facial swelling noted.  Ears: nontender to auricle movement, no erythema, normal TMs bilaterally. No surrounding tenderness, swelling or erythema bilaterally. No mastoid tenderness  bilaterally.  Nose: Mild nasal congestion  Mouth/Throat: Mucous membranes are moist. Mild pharyngeal erythema. No tonsillar swelling or exudate. No uvular shift or deviation. In a linear line to the right inner oral mucosa of cheek multiple areas, as well as left 2 areas to inner cheek of whitish ulcerated appearing lesions that are nontender, no surrounding erythema and no edema noted. Neck: No stridor.  No cervical spine tenderness to palpation. No lip or tongue swelling noted. Hematological/Lymphatic/Immunilogical: No cervical or auricular lymphadenopathy noted. Cardiovascular: Normal rate, regular rhythm. Grossly normal heart sounds.  Good peripheral circulation. Respiratory: Normal respiratory effort.  No retractions. No wheezes, rales or rhonchi. Good air movement.  Gastrointestinal: Soft and nontender. Musculoskeletal: Ambulatory with steady gait. No cervical, thoracic or lumbar tenderness to palpation. Neurologic:  Normal speech and language. No gait  instability. Skin:  Skin appears warm, dry Psychiatric: Mood and affect are normal. Speech and behavior are normal. ___________________________________________   LABS (all labs ordered are listed, but only abnormal results are displayed)  Labs Reviewed  BASIC METABOLIC PANEL - Abnormal; Notable for the following:       Result Value   Glucose, Bld 118 (*)    All other components within normal limits  RAPID STREP SCREEN (NOT AT [facility])  CULTURE, GROUP A STREP [facility])  HSV CULTURE AND TYPING  CBC WITH DIFFERENTIAL/PLATELET  MONONUCLEOSIS SCREEN   ____________________________________________  RADIOLOGY  No results found. ____________________________________________   PROCEDURES Procedures    INITIAL IMPRESSION / ASSESSMENT AND PLAN / ED COURSE  Pertinent labs & imaging results that were available during my care of the patient were reviewed by me and considered in my medical decision making (see chart for details).  Very  well-appearing patient. No acute distress. Quick strep negative, will culture. Patient with recent otitis externa and continued fluid and was treated with otic drops and oral prednisone. Patient now with complaints of sore throat, continued sensation of lymph node swelling in her neck and whitish lesions to interval inner oral cheeks. Lesions have a herpetic appearance, no clear thrush appearance. No definitive lymphadenopathy exam. discussed with patient evaluation a cbc, bmp and mono, laboratory results reviewed, mono negative. laboratory results otherwise unremarkable. hsv culture swab performed of inner lesions. discussed with patient empiric treatment with oral valtrex and use of dukes magic mouthwash. discussed with patient for continued complaints to follow-up with primary care physician or your nose and throat. local ENT information given. discussed strict follow-up and return parameters with patient. Discussed indication, risks and benefits of medications with patient.  Discussed follow up with Primary care physician this week. Discussed follow up and return parameters including no resolution or any worsening concerns. Patient verbalized understanding and agreed to plan.   ____________________________________________   FINAL CLINICAL IMPRESSION(S) / ED DIAGNOSES  Final diagnoses:  Unspecified lesions of oral mucosa  Pharyngitis, unspecified etiology  Otalgia, left     Discharge Medication List as of 09/20/2016  1:24 PM    START taking these medications   Details  Diphenhyd-Hydrocort-Nystatin (FIRST-DUKES MOUTHWASH) SUSP 50ms three times daily swish and spit., Normal    valACYclovir (VALTREX) 1000 MG tablet Take 1 tablet (1,000 mg total) by mouth 2 (two) times daily., Starting Wed 09/20/2016, Until Wed 09/27/2016, Normal        Note: This dictation was prepared with Dragon dictation along with smaller phrase technology. Any transcriptional errors that result from this process are  unintentional.         MMarylene Land NP 09/20/16 2217

## 2016-09-20 NOTE — ED Triage Notes (Signed)
Pt presents with compliant of sore throat, ears, and neck. She was seen by her PCP about 10 days ago for an ear infection, she was treated with drops. She finished those and the problem was still there. She went back to the doctor and they said she has fluid in her ears so they treated her with steroids and she says she finished those and now she is experiencing neck soreness, swollen lymph nodes, and a white rash in her mouth.

## 2016-09-22 LAB — HSV CULTURE AND TYPING

## 2016-09-23 LAB — CULTURE, GROUP A STREP (THRC)

## 2016-10-16 ENCOUNTER — Ambulatory Visit
Admission: RE | Admit: 2016-10-16 | Discharge: 2016-10-16 | Disposition: A | Discharge status: Home / Self Care | Payer: BLUE CROSS/BLUE SHIELD | Source: Ambulatory Visit | Attending: Obstetrics and Gynecology | Admitting: Obstetrics and Gynecology

## 2016-10-16 DIAGNOSIS — N6489 Other specified disorders of breast: Secondary | ICD-10-CM

## 2016-12-07 ENCOUNTER — Other Ambulatory Visit: Payer: Self-pay | Admitting: Family Medicine

## 2016-12-08 DIAGNOSIS — K828 Other specified diseases of gallbladder: Secondary | ICD-10-CM

## 2016-12-12 ENCOUNTER — Encounter: Payer: Self-pay | Admitting: Family Medicine

## 2016-12-12 ENCOUNTER — Ambulatory Visit (INDEPENDENT_AMBULATORY_CARE_PROVIDER_SITE_OTHER): Payer: BLUE CROSS/BLUE SHIELD | Admitting: Family Medicine

## 2016-12-12 VITALS — BP 124/73 | HR 65 | Temp 97.6°F | Wt 147.6 lb

## 2016-12-12 DIAGNOSIS — E7800 Pure hypercholesterolemia, unspecified: Secondary | ICD-10-CM

## 2016-12-12 DIAGNOSIS — D649 Anemia, unspecified: Secondary | ICD-10-CM

## 2016-12-12 DIAGNOSIS — R7301 Impaired fasting glucose: Secondary | ICD-10-CM

## 2016-12-12 DIAGNOSIS — N912 Amenorrhea, unspecified: Secondary | ICD-10-CM | POA: Diagnosis not present

## 2016-12-12 DIAGNOSIS — D229 Melanocytic nevi, unspecified: Secondary | ICD-10-CM

## 2016-12-12 LAB — BAYER DCA HB A1C WAIVED: HB A1C: 5.7 % (ref <7.0)

## 2016-12-12 LAB — HEMOGLOBIN A1C: HEMOGLOBIN A1C: 5.7

## 2016-12-12 NOTE — Progress Notes (Signed)
BP 124/73 (BP Location: Left Arm, Patient Position: Sitting, Cuff Size: Normal)   Pulse 65   Temp 97.6 F (36.4 C)   Wt 147 lb 9 oz (66.9 kg)   LMP 11/01/2016 (Exact Date)   SpO2 98%   BMI 26.14 kg/m    Subjective:    Patient ID: Judy Munoz, female    DOB: 06-14-1970, 46 y.o.   MRN: [id number]  HPI: Judy Munoz is a 46 y.o. female  Chief Complaint  Patient presents with  . Amenorrhea    Patient is 17 days late  . Nevus    right shoulder blade   Impaired Fasting Glucose HbA1C:  Lab Results  Component Value Date   HGBA1C 5.7 12/12/2016   Duration of elevated blood sugar: chronic Polydipsia: no Polyuria: no Weight change: no Visual disturbance: no Glucose Monitoring: no Family history of diabetes: no  HYPERLIPIDEMIA Hyperlipidemia status: excellent compliance Satisfied with current treatment?  yes Side effects:  no Medication compliance: excellent compliance Past cholesterol meds: pravastatin (pravachol) Supplements: none Aspirin:  no The 10-year ASCVD risk score [name], et al., 2013) is: 0.5%   Values used to calculate the score:     Age: 83 years     Sex: Female     Is Non-Hispanic African American: No     Diabetic: No     Tobacco smoker: No     Systolic Blood Pressure: 248 mmHg     Is BP treated: No     HDL Cholesterol: 62 mg/dL     Total Cholesterol: 173 mg/dL Chest pain:  no Coronary artery disease:  no Family history CAD:  yes  AMENORRHEA- couldn't get pregnant without clomid in the past Gravida/Para: G1P1 Home pregnancy test: negative  Nausea: yes Vomiting: no Breast tenderness: yes Abdominal pain: yes Vaginal bleeding: no Pregnancy or labor complications: no Fetal abnormalities: no Contraception: None  SKIN LESION Duration: since she was little, but 2 weeks ago, started to bleed- hair got stuck on it, now more irriated Location: R shoulder blade Painful: no Itching: no Onset: sudden- started getting irritated,  usually chronic and no problems Context: irriated Associated signs and symptoms: bleeding History of skin cancer: no History of precancerous skin lesions: no Family history of skin cancer: no   Relevant past medical, surgical, family and social history reviewed and updated as indicated. Interim medical history since our last visit reviewed. Allergies and medications reviewed and updated.  Review of Systems  Constitutional: Negative.   Respiratory: Negative.   Cardiovascular: Negative.   Psychiatric/Behavioral: Negative.     Per HPI unless specifically indicated above     Objective:    BP 124/73 (BP Location: Left Arm, Patient Position: Sitting, Cuff Size: Normal)   Pulse 65   Temp 97.6 F (36.4 C)   Wt 147 lb 9 oz (66.9 kg)   LMP 11/01/2016 (Exact Date)   SpO2 98%   BMI 26.14 kg/m   Wt Readings from Last 3 Encounters:  12/12/16 147 lb 9 oz (66.9 kg)  09/20/16 144 lb (65.3 kg)  09/14/16 144 lb (65.3 kg)    Physical Exam  Constitutional: She is oriented to person, place, and time. She appears well-developed and well-nourished. No distress.  HENT:  Head: Normocephalic and atraumatic.  Right Ear: Hearing normal.  Left Ear: Hearing normal.  Nose: Nose normal.  Eyes: Conjunctivae and lids are normal. Right eye exhibits no discharge. Left eye exhibits no discharge. No scleral icterus.  Cardiovascular: Normal rate,  regular rhythm, normal heart sounds and intact distal pulses.  Exam reveals no gallop and no friction rub.   No murmur heard. Pulmonary/Chest: Effort normal and breath sounds normal. No respiratory distress. She has no wheezes. She has no rales. She exhibits no tenderness.  Musculoskeletal: Normal range of motion.  Neurological: She is alert and oriented to person, place, and time.  Skin: Skin is warm, dry and intact. No rash noted. She is not diaphoretic. No erythema. No pallor.  Irritated bleeding mole on R scapula  Psychiatric: She has a normal mood and  affect. Her speech is normal and behavior is normal. Judgment and thought content normal. Cognition and memory are normal.  Nursing note and vitals reviewed.   Results for orders placed or performed in visit on 12/12/16  Hemoglobin A1c  Result Value Ref Range   Hemoglobin A1C 5.7       Assessment & Plan:   Problem List Items Addressed This Visit      Endocrine   IFG (impaired fasting glucose) - Primary    Under good control with A1c of 5.7. Continue current regimen. Continue to monitor. Call with any concerns.       Relevant Orders   Bayer DCA Hb A1c Waived   Comprehensive metabolic panel     Other   High cholesterol    Under good control. Recheck labs today. Call with any concerns.       Relevant Orders   Lipid Panel w/o Chol/HDL Ratio   Comprehensive metabolic panel   Anemia    Stable. Continue current regimen. Continue to monitor.       Relevant Orders   Comprehensive metabolic panel    Other Visit Diagnoses    Amenorrhea       Checking labs today. Call with any concerns. Await results.    Relevant Orders   hCG, serum, qualitative   Skin mole       Irritated and bleeding. Will remove today. As below.       Skin Procedure  Procedure: Informed consent given.  Sterile prep of the area.  Area infiltrated with lidocaine with epinephrine.  Using a surgical blade, part of the upper dermis shaved off and sent  for pathology.  Area cauterized. Pt ed on scarring.     Diagnosis:   ICD-10-CM   1. IFG (impaired fasting glucose) R73.01 Bayer DCA Hb A1c Waived    Comprehensive metabolic panel  2. High cholesterol E78.00 Lipid Panel w/o Chol/HDL Ratio    Comprehensive metabolic panel  3. Anemia, unspecified type D64.9 Comprehensive metabolic panel  4. Amenorrhea N91.2 hCG, serum, qualitative   Checking labs today. Call with any concerns. Await results.   5. Skin mole D22.9    Irritated and bleeding. Will remove today. As below.     Lesion Location/Size: irriated,  bleeding 2cm hyperpigmented mole on R scapula Physician: MJ Consent:  Risks, benefits, and alternative treatments discussed and all questions were answered.  Patient elected to proceed and verbal consent obtained.  Description: Area prepped and draped using semi-sterile technique. Area locally anesthetized using 3 cc's of lidocaine 2% with epi.  Shave biopsy of lesion performed using a dermablade.  Adequate hemostastis achieved using Silver Nitrate. Wound dressed after application of bacitracin ointment.  Post Procedure Instructions: Wound care instructions discussed and patient was instructed to keep area clean and dry.  Signs and symptoms of infection discussed, patient agrees to contact the office ASAP should they occur.  Dressing change recommended every other  day  Follow up plan: Return in about 6 months (around 06/14/2017) for Physical.

## 2016-12-12 NOTE — Assessment & Plan Note (Signed)
Under good control. Recheck labs today. Call with any concerns.

## 2016-12-12 NOTE — Assessment & Plan Note (Signed)
Stable. Continue current regimen. Continue to monitor.

## 2016-12-12 NOTE — Assessment & Plan Note (Signed)
Under good control with A1c of 5.7. Continue current regimen. Continue to monitor. Call with any concerns.

## 2016-12-13 ENCOUNTER — Telehealth: Payer: Self-pay | Admitting: Family Medicine

## 2016-12-13 LAB — COMPREHENSIVE METABOLIC PANEL
ALBUMIN: 4.5 g/dL (ref 3.5–5.5)
ALT: 12 IU/L (ref 0–32)
AST: 17 IU/L (ref 0–40)
Albumin/Globulin Ratio: 2.3 — ABNORMAL HIGH (ref 1.2–2.2)
Alkaline Phosphatase: 49 IU/L (ref 39–117)
BUN/Creatinine Ratio: 12 (ref 9–23)
BUN: 9 mg/dL (ref 6–24)
Bilirubin Total: 0.4 mg/dL (ref 0.0–1.2)
CALCIUM: 9.5 mg/dL (ref 8.7–10.2)
CO2: 24 mmol/L (ref 20–29)
CREATININE: 0.77 mg/dL (ref 0.57–1.00)
Chloride: 103 mmol/L (ref 96–106)
GFR calc Af Amer: 108 mL/min/{1.73_m2} (ref >59)
GFR, EST NON AFRICAN AMERICAN: 94 mL/min/{1.73_m2} (ref >59)
GLOBULIN, TOTAL: 2 g/dL (ref 1.5–4.5)
Glucose: 98 mg/dL (ref 65–99)
Potassium: 4.4 mmol/L (ref 3.5–5.2)
SODIUM: 141 mmol/L (ref 134–144)
Total Protein: 6.5 g/dL (ref 6.0–8.5)

## 2016-12-13 LAB — LIPID PANEL W/O CHOL/HDL RATIO
Cholesterol, Total: 170 mg/dL (ref 100–199)
HDL: 63 mg/dL (ref >39)
LDL Calculated: 88 mg/dL (ref 0–99)
Triglycerides: 96 mg/dL (ref 0–149)
VLDL CHOLESTEROL CAL: 19 mg/dL (ref 5–40)

## 2016-12-13 LAB — HCG, SERUM, QUALITATIVE: hCG,Beta Subunit,Qual,Serum: NEGATIVE m[IU]/mL (ref <6)

## 2016-12-13 NOTE — Telephone Encounter (Signed)
Please let her know that her pregnancy test was negative. The rest of her labs look good- everything is staying right where they are supposed to. Thanks!

## 2016-12-13 NOTE — Telephone Encounter (Signed)
Patient notified

## 2016-12-15 ENCOUNTER — Encounter: Payer: Self-pay | Admitting: Family Medicine

## 2016-12-21 ENCOUNTER — Encounter: Payer: Self-pay | Admitting: Obstetrics and Gynecology

## 2017-01-10 ENCOUNTER — Other Ambulatory Visit: Payer: Self-pay | Admitting: Gastroenterology

## 2017-01-31 ENCOUNTER — Ambulatory Visit (INDEPENDENT_AMBULATORY_CARE_PROVIDER_SITE_OTHER): Payer: BLUE CROSS/BLUE SHIELD

## 2017-01-31 DIAGNOSIS — Z23 Encounter for immunization: Secondary | ICD-10-CM | POA: Diagnosis not present

## 2017-03-13 ENCOUNTER — Ambulatory Visit (INDEPENDENT_AMBULATORY_CARE_PROVIDER_SITE_OTHER): Payer: BLUE CROSS/BLUE SHIELD | Admitting: Obstetrics and Gynecology

## 2017-03-13 ENCOUNTER — Encounter: Payer: Self-pay | Admitting: Obstetrics and Gynecology

## 2017-03-13 VITALS — BP 116/75 | HR 73 | Ht 63.0 in | Wt 147.9 lb

## 2017-03-13 DIAGNOSIS — Z1231 Encounter for screening mammogram for malignant neoplasm of breast: Secondary | ICD-10-CM | POA: Diagnosis not present

## 2017-03-13 DIAGNOSIS — N951 Menopausal and female climacteric states: Secondary | ICD-10-CM | POA: Diagnosis not present

## 2017-03-13 DIAGNOSIS — Z1239 Encounter for other screening for malignant neoplasm of breast: Secondary | ICD-10-CM

## 2017-03-13 DIAGNOSIS — D259 Leiomyoma of uterus, unspecified: Secondary | ICD-10-CM | POA: Diagnosis not present

## 2017-03-13 DIAGNOSIS — Z01419 Encounter for gynecological examination (general) (routine) without abnormal findings: Secondary | ICD-10-CM | POA: Diagnosis not present

## 2017-03-13 DIAGNOSIS — Z124 Encounter for screening for malignant neoplasm of cervix: Secondary | ICD-10-CM

## 2017-03-13 MED ORDER — NORETHINDRONE ACETATE 5 MG PO TABS: 5.0000 mg | ORAL_TABLET | Freq: Every day | ORAL | 2 refills | Status: DC

## 2017-03-13 MED ORDER — ESTRADIOL 1 MG PO TABS: 1.0000 mg | ORAL_TABLET | Freq: Every day | ORAL | 2 refills | Status: DC

## 2017-03-13 NOTE — Progress Notes (Signed)
GYNECOLOGY ANNUAL PHYSICAL EXAM PROGRESS NOTE  Subjective:    Judy Munoz is a 46 y.o. G3P1001 married female who presents for an annual exam.  The patient is sexually active.  The patient wears seatbelts: yes. The patient participates in regular exercise: no. Has the patient ever been transfused or tattooed?: no. The patient reports that there is not domestic violence in her life.    The patient has the following complaints today:  1.  Still noting changes in menses, occurring now every 21 to 28 days.  Also noting mood changes. Periods now lasting 5 days.    Gynecologic History Menarche age: 21 Patient's last menstrual period was 03/06/2017. Contraception: none History of STI's: Denies Last Pap: 01/2014. Results were: normal.  Denies h/o abnormal pap smears. Last mammogram: 03/2016. Results were: abnormal (BIRADS 0), possible asymmetry of right breast.  Followed by q 6 month mammograms over the past year.    Obstetric History   G1   P1   T1   P0   A0   L1    SAB0   TAB0   Ectopic0   Multiple0   Live Births1     # Outcome Date GA Lbr Len/2nd Weight Sex Delivery Anes PTL Lv  1 Term 2014 [redacted]w[redacted]d 5 lb 12 oz (2.608 kg) F Vag-Spont  N LIV      Past Medical History:  Diagnosis Date  . Anemia    H/O WITH PREGNANCY  . Elevated glucose   . GERD (gastroesophageal reflux disease)   . Headache    MIGRAINES RARE  . Heart murmur    WAS TOLD THIS ONCE YEARS AGO-HAS NEVER BEEN TOLD SINCE  . High cholesterol   . History of uterine fibroid   . IFG (impaired fasting glucose)   . PONV (postoperative nausea and vomiting)    HAPPENED WITH HER 1ST EGD- HER 2ND EGD SHE WAS GIVEN ANTI-EMETIC IV AND HAD NO N/V  . Vitamin D deficiency     Past Surgical History:  Procedure Laterality Date  . CHOLECYSTECTOMY, LAPAROSCOPIC  07/18/2016  . ESOPHAGOGASTRODUODENOSCOPY      Family History  Problem Relation Age of Onset  . Diabetes Father   . Heart disease Father   . Hypertension  Father   . Hyperlipidemia Father   . Healthy Mother   . Cancer Maternal Grandmother        uterine  . Breast cancer Maternal Grandmother [phone] . Lung cancer Maternal Uncle     Social History   Socioeconomic History  . Marital status: Married    Spouse name: Not on file  . Number of children: Not on file  . Years of education: Not on file  . Highest education level: Not on file  Social Needs  . Financial resource strain: Not on file  . Food insecurity - worry: Not on file  . Food insecurity - inability: Not on file  . Transportation needs - medical: Not on file  . Transportation needs - non-medical: Not on file  Occupational History  . Not on file  Tobacco Use  . Smoking status: Never Smoker  . Smokeless tobacco: Never Used  Substance and Sexual Activity  . Alcohol use: No  . Drug use: No  . Sexual activity: Yes    Birth control/protection: None  Other Topics Concern  . Not on file  Social History Narrative  . Not on file    Current Outpatient Medications on File Prior to Visit  Medication Sig Dispense Refill  . cetirizine (ZYRTEC) 10 MG tablet Take 10 mg by mouth daily.    . cholecalciferol (VITAMIN D) 1000 UNITS tablet Take 1,000 Units by mouth daily.    . pantoprazole (PROTONIX) 40 MG tablet TAKE 1 TABLET (40 MG TOTAL) BY MOUTH 2 (TWO) TIMES DAILY. 60 tablet 6  . pravastatin (PRAVACHOL) 20 MG tablet TAKE 1 TABLET (20 MG TOTAL) BY MOUTH AT BEDTIME. 90 tablet 1  . acetaminophen (TYLENOL) 500 MG tablet Take 500 mg by mouth every 6 (six) hours as needed for mild pain.    . pantoprazole (PROTONIX) 40 MG tablet TAKE 1 TABLET (40 MG TOTAL) BY MOUTH 2 (TWO) TIMES DAILY. 60 tablet 6   No current facility-administered medications on file prior to visit.     Allergies  Allergen Reactions  . Sulfa Antibiotics Hives      Review of Systems Constitutional: negative for chills, fatigue, fevers and sweats Eyes: negative for irritation, redness and visual disturbance Ears,  nose, mouth, throat, and face: negative for hearing loss, nasal congestion, snoring and tinnitus Respiratory: negative for asthma, cough, sputum Cardiovascular: negative for chest pain, dyspnea, exertional chest pressure/discomfort, irregular heart beat, palpitations and syncope Gastrointestinal: negative for abdominal pain, change in bowel habits, nausea and vomiting Genitourinary: positive for changes in menstrual periods (see HPI).  Negative for genital lesions, sexual problems and vaginal discharge, dysuria and urinary incontinence Integument/breast: negative for breast lump, breast tenderness and nipple discharge Hematologic/lymphatic: negative for bleeding and easy bruising Musculoskeletal:  Negative for muscle weakness or back pain Neurological: negative for dizziness, headaches, vertigo and weakness Endocrine: negative for diabetic symptoms including polydipsia, polyuria and skin dryness Allergic/Immunologic: negative for hay fever and urticaria    Psychologic: mood changes     Objective:  Blood pressure 116/75, pulse 73, height 5' 3"  (1.6 m), weight 147 lb 14.4 oz (67.1 kg), last menstrual period 03/06/2017. Body mass index is 26.2 kg/m.  General Appearance:    Alert, cooperative, no distress, appears stated age, overweight  Head:    Normocephalic, without obvious abnormality, atraumatic  Eyes:    PERRL, conjunctiva/corneas clear, EOM's intact, both eyes  Ears:    Normal external ear canals, both ears  Nose:   Nares normal, septum midline, mucosa normal, no drainage or sinus tenderness  Throat:   Lips, mucosa, and tongue normal; teeth and gums normal  Neck:   Supple, symmetrical, trachea midline, no adenopathy; thyroid: no enlargement/tenderness/nodules; no carotid bruit or JVD  Back:     Symmetric, no curvature, ROM normal, no CVA tenderness  Lungs:     Clear to auscultation bilaterally, respirations unlabored  Chest Wall:    No tenderness or deformity   Heart:    Regular rate  and rhythm, S1 and S2 normal, no murmur, rub or gallop  Breast Exam:    No tenderness, masses, or nipple abnormality  Abdomen:     Soft, non-tender, bowel sounds active all four quadrants, no masses, no organomegaly.    Genitalia:    Pelvic:external genitalia normal, vagina without lesions, discharge, or tenderness, rectovaginal septum  normal. Cervix normal in appearance, no cervical motion tenderness, no adnexal masses or tenderness.  Uterus 10-12 week size, shape, mobile, regular contours, nontender.  Rectal:    Normal external sphincter.  No hemorrhoids appreciated. Internal exam not done.   Extremities:   Extremities normal, atraumatic, no cyanosis or edema  Pulses:   2+ and symmetric all extremities  Skin:   Skin color, texture, turgor  normal, no rashes or lesions  Lymph nodes:   Cervical, supraclavicular, and axillary nodes normal  Neurologic:   CNII-XII intact, normal strength, sensation and reflexes throughout   .  Labs:  Lab Results  Component Value Date   WBC 9.6 09/20/2016   HGB 12.9 09/20/2016   HCT 38.5 09/20/2016   MCV 84.5 09/20/2016   PLT 333 09/20/2016    Lab Results  Component Value Date   CREATININE 0.77 12/12/2016   BUN 9 12/12/2016   NA 141 12/12/2016   K 4.4 12/12/2016   CL 103 12/12/2016   CO2 24 12/12/2016    Lab Results  Component Value Date   ALT 12 12/12/2016   AST 17 12/12/2016   ALKPHOS 49 12/12/2016   BILITOT 0.4 12/12/2016    Lab Results  Component Value Date   TSH 1.710 06/14/2016    Lab Results  Component Value Date   CHOL 170 12/12/2016   HDL 63 12/12/2016   LDLCALC 88 12/12/2016   TRIG 96 12/12/2016   CHOLHDL 3.9 03/02/2015    Lab Results  Component Value Date   HGBA1C 5.7 12/12/2016      Assessment:   Healthy female exam.  Perimenopausal symptoms Fibroid uterus  Plan:    - Blood tests: Up to date. - Breast self exam technique reviewed and patient encouraged to perform self-exam monthly. - Contraception: none.   - Discussed healthy lifestyle modifications. - Mammogram ordered.   - Discussed perimenopausal symptoms (mood changes, shortening of cycles with heavier bleeding).  Patient also with h/o small fibroids, previously asymptomatic. Uterine size unchanged since last year's exam.  Discussed treatment options with patient, including menopausal HRT dose range, OCPs, Depo Provera, Mirena IUD; or surgical management with endometrial ablation or hysterectomy.  Patient would like to try menopausal HRT dosing as she has tried OCPs last year for symptoms and notes the hormones "made her crazy".   - Flu vaccine up to date.  - RTC in 1 year, or sooner if symptoms persist.     Rubie Maid, MD [facility]

## 2017-03-13 NOTE — Patient Instructions (Addendum)
Health Maintenance, Female Adopting a healthy lifestyle and getting preventive care can go a long way to promote health and wellness. Talk with your health care provider about what schedule of regular examinations is right for you. This is a good chance for you to check in with your provider about disease prevention and staying healthy. In between checkups, there are plenty of things you can do on your own. Experts have done a lot of research about which lifestyle changes and preventive measures are most likely to keep you healthy. Ask your health care provider for more information. Weight and diet Eat a healthy diet  Be sure to include plenty of vegetables, fruits, low-fat dairy products, and lean protein.  Do not eat a lot of foods high in solid fats, added sugars, or salt.  Get regular exercise. This is one of the most important things you can do for your health. ? Most adults should exercise for at least 150 minutes each week. The exercise should increase your heart rate and make you sweat (moderate-intensity exercise). ? Most adults should also do strengthening exercises at least twice a week. This is in addition to the moderate-intensity exercise.  Maintain a healthy weight  Body mass index (BMI) is a measurement that can be used to identify possible weight problems. It estimates body fat based on height and weight. Your health care provider can help determine your BMI and help you achieve or maintain a healthy weight.  For females 20 years of age and older: ? A BMI below 18.5 is considered underweight. ? A BMI of 18.5 to 24.9 is normal. ? A BMI of 25 to 29.9 is considered overweight. ? A BMI of 30 and above is considered obese.  Watch levels of cholesterol and blood lipids  You should start having your blood tested for lipids and cholesterol at 45 years of age, then have this test every 5 years.  You may need to have your cholesterol levels checked more often if: ? Your lipid or  cholesterol levels are high. ? You are older than 45 years of age. ? You are at high risk for heart disease.  Cancer screening Lung Cancer  Lung cancer screening is recommended for adults 55-80 years old who are at high risk for lung cancer because of a history of smoking.  A yearly low-dose CT scan of the lungs is recommended for people who: ? Currently smoke. ? Have quit within the past 15 years. ? Have at least a 30-pack-year history of smoking. A pack year is smoking an average of one pack of cigarettes a day for 1 year.  Yearly screening should continue until it has been 15 years since you quit.  Yearly screening should stop if you develop a health problem that would prevent you from having lung cancer treatment.  Breast Cancer  Practice breast self-awareness. This means understanding how your breasts normally appear and feel.  It also means doing regular breast self-exams. Let your health care provider know about any changes, no matter how small.  If you are in your 20s or 30s, you should have a clinical breast exam (CBE) by a health care provider every 1-3 years as part of a regular health exam.  If you are 40 or older, have a CBE every year. Also consider having a breast X-ray (mammogram) every year.  If you have a family history of breast cancer, talk to your health care provider about genetic screening.  If you are at high risk   for breast cancer, talk to your health care provider about having an MRI and a mammogram every year.  Breast cancer gene (BRCA) assessment is recommended for women who have family members with BRCA-related cancers. BRCA-related cancers include: ? Breast. ? Ovarian. ? Tubal. ? Peritoneal cancers.  Results of the assessment will determine the need for genetic counseling and BRCA1 and BRCA2 testing.  Cervical Cancer Your health care provider may recommend that you be screened regularly for cancer of the pelvic organs (ovaries, uterus, and  vagina). This screening involves a pelvic examination, including checking for microscopic changes to the surface of your cervix (Pap test). You may be encouraged to have this screening done every 3 years, beginning at age 22.  For women ages 56-65, health care providers may recommend pelvic exams and Pap testing every 3 years, or they may recommend the Pap and pelvic exam, combined with testing for human papilloma virus (HPV), every 5 years. Some types of HPV increase your risk of cervical cancer. Testing for HPV may also be done on women of any age with unclear Pap test results.  Other health care providers may not recommend any screening for nonpregnant women who are considered low risk for pelvic cancer and who do not have symptoms. Ask your health care provider if a screening pelvic exam is right for you.  If you have had past treatment for cervical cancer or a condition that could lead to cancer, you need Pap tests and screening for cancer for at least 20 years after your treatment. If Pap tests have been discontinued, your risk factors (such as having a new sexual partner) need to be reassessed to determine if screening should resume. Some women have medical problems that increase the chance of getting cervical cancer. In these cases, your health care provider may recommend more frequent screening and Pap tests.  Colorectal Cancer  This type of cancer can be detected and often prevented.  Routine colorectal cancer screening usually begins at 45 years of age and continues through 45 years of age.  Your health care provider may recommend screening at an earlier age if you have risk factors for colon cancer.  Your health care provider may also recommend using home test kits to check for hidden blood in the stool.  A small camera at the end of a tube can be used to examine your colon directly (sigmoidoscopy or colonoscopy). This is done to check for the earliest forms of colorectal  cancer.  Routine screening usually begins at age 33.  Direct examination of the colon should be repeated every 5-10 years through 45 years of age. However, you may need to be screened more often if early forms of precancerous polyps or small growths are found.  Skin Cancer  Check your skin from head to toe regularly.  Tell your health care provider about any new moles or changes in moles, especially if there is a change in a mole's shape or color.  Also tell your health care provider if you have a mole that is larger than the size of a pencil eraser.  Always use sunscreen. Apply sunscreen liberally and repeatedly throughout the day.  Protect yourself by wearing long sleeves, pants, a wide-brimmed hat, and sunglasses whenever you are outside.  Heart disease, diabetes, and high blood pressure  High blood pressure causes heart disease and increases the risk of stroke. High blood pressure is more likely to develop in: ? People who have blood pressure in the high end of  the normal range (130-139/85-89 mm Hg). ? People who are overweight or obese. ? People who are African American.  If you are 21-29 years of age, have your blood pressure checked every 3-5 years. If you are 3 years of age or older, have your blood pressure checked every year. You should have your blood pressure measured twice-once when you are at a hospital or clinic, and once when you are not at a hospital or clinic. Record the average of the two measurements. To check your blood pressure when you are not at a hospital or clinic, you can use: ? An automated blood pressure machine at a pharmacy. ? A home blood pressure monitor.  If you are between 17 years and 37 years old, ask your health care provider if you should take aspirin to prevent strokes.  Have regular diabetes screenings. This involves taking a blood sample to check your fasting blood sugar level. ? If you are at a normal weight and have a low risk for diabetes,  have this test once every three years after 45 years of age. ? If you are overweight and have a high risk for diabetes, consider being tested at a younger age or more often. Preventing infection Hepatitis B  If you have a higher risk for hepatitis B, you should be screened for this virus. You are considered at high risk for hepatitis B if: ? You were born in a country where hepatitis B is common. Ask your health care provider which countries are considered high risk. ? Your parents were born in a high-risk country, and you have not been immunized against hepatitis B (hepatitis B vaccine). ? You have HIV or AIDS. ? You use needles to inject street drugs. ? You live with someone who has hepatitis B. ? You have had sex with someone who has hepatitis B. ? You get hemodialysis treatment. ? You take certain medicines for conditions, including cancer, organ transplantation, and autoimmune conditions.  Hepatitis C  Blood testing is recommended for: ? Everyone born from [birth year] through 1964. ? Anyone with known risk factors for hepatitis C.  Sexually transmitted infections (STIs)  You should be screened for sexually transmitted infections (STIs) including gonorrhea and chlamydia if: ? You are sexually active and are younger than 45 years of age. ? You are older than 45 years of age and your health care provider tells you that you are at risk for this type of infection. ? Your sexual activity has changed since you were last screened and you are at an increased risk for chlamydia or gonorrhea. Ask your health care provider if you are at risk.  If you do not have HIV, but are at risk, it may be recommended that you take a prescription medicine daily to prevent HIV infection. This is called pre-exposure prophylaxis (PrEP). You are considered at risk if: ? You are sexually active and do not regularly use condoms or know the HIV status of your partner(s). ? You take drugs by injection. ? You are  sexually active with a partner who has HIV.  Talk with your health care provider about whether you are at high risk of being infected with HIV. If you choose to begin PrEP, you should first be tested for HIV. You should then be tested every 3 months for as long as you are taking PrEP. Pregnancy  If you are premenopausal and you may become pregnant, ask your health care provider about preconception counseling.  If you may become  pregnant, take 400 to 800 micrograms (mcg) of folic acid every day.  If you want to prevent pregnancy, talk to your health care provider about birth control (contraception). Osteoporosis and menopause  Osteoporosis is a disease in which the bones lose minerals and strength with aging. This can result in serious bone fractures. Your risk for osteoporosis can be identified using a bone density scan.  If you are 65 years of age or older, or if you are at risk for osteoporosis and fractures, ask your health care provider if you should be screened.  Ask your health care provider whether you should take a calcium or vitamin D supplement to lower your risk for osteoporosis.  Menopause may have certain physical symptoms and risks.  Hormone replacement therapy may reduce some of these symptoms and risks. Talk to your health care provider about whether hormone replacement therapy is right for you. Follow these instructions at home:  Schedule regular health, dental, and eye exams.  Stay current with your immunizations.  Do not use any tobacco products including cigarettes, chewing tobacco, or electronic cigarettes.  If you are pregnant, do not drink alcohol.  If you are breastfeeding, limit how much and how often you drink alcohol.  Limit alcohol intake to no more than 1 drink per day for nonpregnant women. One drink equals 12 ounces of beer, 5 ounces of wine, or 1 ounces of hard liquor.  Do not use street drugs.  Do not share needles.  Ask your health care  provider for help if you need support or information about quitting drugs.  Tell your health care provider if you often feel depressed.  Tell your health care provider if you have ever been abused or do not feel safe at home. This information is not intended to replace advice given to you by your health care provider. Make sure you discuss any questions you have with your health care provider. Document Released: 10/31/2010 Document Revised: 09/23/2015 Document Reviewed: 01/19/2015 Elsevier Interactive Patient Education  2018 [employer]    Perimenopause Perimenopause is the time when your body begins to move into the menopause (no menstrual period for 12 straight months). It is a natural process. Perimenopause can begin 2-8 years before the menopause and usually lasts for 1 year after the menopause. During this time, your ovaries may or may not produce an egg. The ovaries vary in their production of estrogen and progesterone hormones each month. This can cause irregular menstrual periods, difficulty getting pregnant, vaginal bleeding between periods, and uncomfortable symptoms. What are the causes?  Irregular production of the ovarian hormones, estrogen and progesterone, and not ovulating every month. Other causes include:  Tumor of the pituitary gland in the brain.  Medical disease that affects the ovaries.  Radiation treatment.  Chemotherapy.  Unknown causes.  Heavy smoking and excessive alcohol intake can bring on perimenopause sooner.  What are the signs or symptoms?  Hot flashes.  Night sweats.  Irregular menstrual periods.  Decreased sex drive.  Vaginal dryness.  Headaches.  Mood swings.  Depression.  Memory problems.  Irritability.  Tiredness.  Weight gain.  Trouble getting pregnant.  The beginning of losing bone cells (osteoporosis).  The beginning of hardening of the arteries (atherosclerosis). How is this diagnosed? Your health care provider  will make a diagnosis by analyzing your age, menstrual history, and symptoms. He or she will do a physical exam and note any changes in your body, especially your female organs. Female hormone tests may or may   depending on the amount of female hormones you produce and when you produce them. However, other hormone tests may be helpful to rule out other problems. How is this treated? In some cases, no treatment is needed. The decision on whether treatment is necessary during the perimenopause should be made by you and your health care provider based on how the symptoms are affecting you and your lifestyle. Various treatments are available, such as:  Treating individual symptoms with a specific medicine for that symptom.  Herbal medicines that can help specific symptoms.  Counseling.  Group therapy.  Follow these instructions at home:  Keep track of your menstrual periods (when they occur, how heavy they are, how long between periods, and how long they last) as well as your symptoms and when they started.  Only take over-the-counter or prescription medicines as directed by your health care provider.  Sleep and rest.  Exercise.  Eat a diet that contains calcium (good for your bones) and soy (acts like the estrogen hormone).  Do not smoke.  Avoid alcoholic beverages.  Take vitamin supplements as recommended by your health care provider. Taking vitamin E may help in certain cases.  Take calcium and vitamin D supplements to help prevent bone loss.  Group therapy is sometimes helpful.  Acupuncture may help in some cases. Contact a health care provider if:  You have questions about any symptoms you are having.  You need a referral to a specialist (gynecologist, psychiatrist, or psychologist). Get help right away if:  You have vaginal bleeding.  Your period lasts longer than 8 days.  Your periods are recurring sooner than 21 days.  You have bleeding after  intercourse.  You have severe depression.  You have pain when you urinate.  You have severe headaches.  You have vision problems. This information is not intended to replace advice given to you by your health care provider. Make sure you discuss any questions you have with your health care provider. Document Released: 05/25/2004 Document Revised: 09/23/2015 Document Reviewed: 11/14/2012 Elsevier Interactive Patient Education  2017 [employer].

## 2017-03-13 NOTE — Addendum Note (Signed)
Addended by: Augusto Gamble on: 03/13/2017 10:43 PM   Modules accepted: Orders

## 2017-03-14 ENCOUNTER — Encounter: Payer: Self-pay | Admitting: Obstetrics and Gynecology

## 2017-03-16 LAB — PAPIG, HPV, RFX 16/18
HPV, high-risk: NEGATIVE
PAP SMEAR COMMENT: 0

## 2017-03-19 ENCOUNTER — Encounter: Payer: Self-pay | Admitting: Family Medicine

## 2017-03-19 ENCOUNTER — Ambulatory Visit: Payer: BLUE CROSS/BLUE SHIELD | Admitting: Family Medicine

## 2017-03-19 VITALS — BP 115/79 | HR 71 | Temp 98.5°F | Resp 16 | Ht 63.0 in | Wt 148.1 lb

## 2017-03-19 DIAGNOSIS — J029 Acute pharyngitis, unspecified: Secondary | ICD-10-CM

## 2017-03-19 MED ORDER — AMOXICILLIN 875 MG PO TABS: 875.0000 mg | ORAL_TABLET | Freq: Two times a day (BID) | ORAL | 0 refills | Status: DC

## 2017-03-19 MED ORDER — LIDOCAINE VISCOUS 2 % MT SOLN: 5.0000 mL | OROMUCOSAL | 0 refills | Status: DC | PRN

## 2017-03-19 NOTE — Progress Notes (Signed)
BP 115/79   Pulse 71   Temp 98.5 F (36.9 C) (Oral)   Resp 16   Ht 5' 3"  (1.6 m)   Wt 148 lb 1.6 oz (67.2 kg)   LMP 03/06/2017   SpO2 99%   BMI 26.23 kg/m    Subjective:    Patient ID: Judy Munoz, female    DOB: 11-28-1970, 46 y.o.   MRN: [id number]  HPI: Judy Munoz is a 46 y.o. female  Chief Complaint  Patient presents with  . Sore Throat    ear pain onset 3 days has whit spot back of throat, L ear pain, had some cancer sore, sinus drainge, low grade fever, little cough  . Laryngitis   3 day hx of severe sore throat that feels swollen, hoarseness, low grade fevers. Denies ear pain, congestion, productive cough, N/V/D. Tried taking some sudafed with no relief. Daughter has been sick at home recently.   Relevant past medical, surgical, family and social history reviewed and updated as indicated. Interim medical history since our last visit reviewed. Allergies and medications reviewed and updated.  Review of Systems  Constitutional: Positive for fever.  HENT: Positive for sore throat and voice change.   Respiratory: Negative.   Cardiovascular: Negative.   Gastrointestinal: Negative.   Genitourinary: Negative.   Musculoskeletal: Negative.   Neurological: Negative.   Psychiatric/Behavioral: Negative.     Per HPI unless specifically indicated above     Objective:    BP 115/79   Pulse 71   Temp 98.5 F (36.9 C) (Oral)   Resp 16   Ht 5' 3"  (1.6 m)   Wt 148 lb 1.6 oz (67.2 kg)   LMP 03/06/2017   SpO2 99%   BMI 26.23 kg/m   Wt Readings from Last 3 Encounters:  03/19/17 148 lb 1.6 oz (67.2 kg)  03/13/17 147 lb 14.4 oz (67.1 kg)  12/12/16 147 lb 9 oz (66.9 kg)    Physical Exam  Constitutional: She is oriented to person, place, and time. She appears well-developed and well-nourished. No distress.  HENT:  Head: Atraumatic.  Right Ear: External ear normal.  Left Ear: External ear normal.  Nose: Nose normal.  Mouth/Throat: Oropharyngeal exudate  present.  Oropharynx erythematous and edematous  Eyes: Conjunctivae are normal. Pupils are equal, round, and reactive to light. No scleral icterus.  Neck: Normal range of motion. Neck supple.  Cardiovascular: Normal rate, regular rhythm and normal heart sounds.  Pulmonary/Chest: Effort normal and breath sounds normal. No respiratory distress.  Musculoskeletal: Normal range of motion.  Lymphadenopathy:    She has no cervical adenopathy.  Neurological: She is alert and oriented to person, place, and time.  Skin: Skin is warm and dry.  Psychiatric: She has a normal mood and affect. Her behavior is normal.  Nursing note and vitals reviewed.   Results for orders placed or performed in visit on 03/19/17  Rapid Strep screen  Result Value Ref Range   Strep Gp A Ag, IA W/Reflex Negative Negative  Culture, Group A Strep  Result Value Ref Range   Strep A Culture WILL FOLLOW       Assessment & Plan:   Problem List Items Addressed This Visit    None    Visit Diagnoses    Pharyngitis, unspecified etiology    -  Primary   Rapid strep neg, await cx. Hx and exam findings indicative of strep infection - will start amoxil until results are in. Viscous lidocaine sent for comfort  Relevant Orders   Rapid Strep screen (Completed)       Follow up plan: Return if symptoms worsen or fail to improve.

## 2017-03-21 NOTE — Patient Instructions (Signed)
Follow up as needed

## 2017-03-22 LAB — RAPID STREP SCREEN (MED CTR MEBANE ONLY): Strep Gp A Ag, IA W/Reflex: NEGATIVE

## 2017-03-22 LAB — CULTURE, GROUP A STREP: STREP A CULTURE: NEGATIVE

## 2017-03-26 ENCOUNTER — Other Ambulatory Visit: Payer: Self-pay | Admitting: Obstetrics and Gynecology

## 2017-03-26 DIAGNOSIS — Z1231 Encounter for screening mammogram for malignant neoplasm of breast: Secondary | ICD-10-CM

## 2017-04-02 ENCOUNTER — Encounter: Payer: Self-pay | Admitting: Family Medicine

## 2017-04-02 ENCOUNTER — Ambulatory Visit: Payer: BLUE CROSS/BLUE SHIELD | Admitting: Family Medicine

## 2017-04-02 VITALS — BP 122/77 | HR 73 | Temp 98.0°F | Wt 148.8 lb

## 2017-04-02 DIAGNOSIS — J069 Acute upper respiratory infection, unspecified: Secondary | ICD-10-CM | POA: Diagnosis not present

## 2017-04-02 DIAGNOSIS — J309 Allergic rhinitis, unspecified: Secondary | ICD-10-CM | POA: Diagnosis not present

## 2017-04-02 MED ORDER — FLUTICASONE PROPIONATE 50 MCG/ACT NA SUSP: 2.0000 | Freq: Two times a day (BID) | NASAL | 6 refills | Status: DC

## 2017-04-02 MED ORDER — PREDNISONE 10 MG PO TABS: ORAL_TABLET | ORAL | 0 refills | Status: DC

## 2017-04-02 NOTE — Progress Notes (Signed)
BP 122/77 (BP Location: Left Arm, Patient Position: Sitting, Cuff Size: Normal)   Pulse 73   Temp 98 F (36.7 C) (Oral)   Wt 148 lb 12.8 oz (67.5 kg)   LMP 03/06/2017   SpO2 99%   BMI 26.36 kg/m    Subjective:    Patient ID: Judy Munoz, female    DOB: [birth year], 46 y.o.   MRN: [id number]  HPI: Judy Munoz is a 46 y.o. female  Chief Complaint  Patient presents with  . Sore Throat    Still has sore throat, patient got a little better but started to feel worse again this Saturday.  . Laryngitis    Voice is in and out  . Chest Pain    Patient states there's a lump and soreness around one of her right ribs. Patient thinks it's from coughing so hard.   Patient presents with persistent sore throat, hoarseness, chest soreness, and hacking cough. Completed amoxil with some relief, thinks the cough is causing her persistent sxs. Has not taken anything OTC since. Does take daily protonix, does not feel like reflux is flaring.   Relevant past medical, surgical, family and social history reviewed and updated as indicated. Interim medical history since our last visit reviewed. Allergies and medications reviewed and updated.  Review of Systems  Constitutional: Negative.   HENT: Positive for postnasal drip, sore throat and voice change.   Eyes: Negative.   Respiratory: Positive for cough and chest tightness.   Cardiovascular: Negative.   Gastrointestinal: Negative.   Genitourinary: Negative.   Musculoskeletal: Negative.   Neurological: Negative.   Psychiatric/Behavioral: Negative.    Per HPI unless specifically indicated above     Objective:    BP 122/77 (BP Location: Left Arm, Patient Position: Sitting, Cuff Size: Normal)   Pulse 73   Temp 98 F (36.7 C) (Oral)   Wt 148 lb 12.8 oz (67.5 kg)   LMP 03/06/2017   SpO2 99%   BMI 26.36 kg/m   Wt Readings from Last 3 Encounters:  04/02/17 148 lb 12.8 oz (67.5 kg)  03/19/17 148 lb 1.6 oz (67.2 kg)  03/13/17 147 lb  14.4 oz (67.1 kg)    Physical Exam  Constitutional: She is oriented to person, place, and time. She appears well-developed and well-nourished. No distress.  HENT:  Head: Atraumatic.  Right Ear: External ear normal.  Left Ear: External ear normal.  Nasal mucosa boggy and eyrthematous Oropharynx erythematous posteriorly  Eyes: Conjunctivae are normal. Pupils are equal, round, and reactive to light.  Neck: Normal range of motion. Neck supple.  Cardiovascular: Normal rate and normal heart sounds.  Pulmonary/Chest: Effort normal and breath sounds normal. No respiratory distress. She has no wheezes.  Musculoskeletal: Normal range of motion.  Neurological: She is alert and oriented to person, place, and time.  Skin: Skin is warm and dry.  Psychiatric: She has a normal mood and affect. Her behavior is normal.  Nursing note and vitals reviewed.  Results for orders placed or performed in visit on 03/19/17  Rapid Strep screen  Result Value Ref Range   Strep Gp A Ag, IA W/Reflex Negative Negative  Culture, Group A Strep  Result Value Ref Range   Strep A Culture Negative       Assessment & Plan:   Problem List Items Addressed This Visit    None    Visit Diagnoses    Upper respiratory tract infection, unspecified type    -  Primary  Suspect her cough and post-nasal drip causing sxs to persist. Will start prednisone taper, flonase BID. Continue lidocaine gargle prn. F/u if no improvement   Allergic rhinitis, unspecified seasonality, unspecified trigger       Add flonase to zyrtec regimen to help reduce Post-nasal drip. Monitor closely for benefit of persistent cough and sore throat.        Follow up plan: Return if symptoms worsen or fail to improve.

## 2017-04-04 NOTE — Patient Instructions (Signed)
Follow up as needed

## 2017-04-17 ENCOUNTER — Ambulatory Visit
Admission: RE | Admit: 2017-04-17 | Discharge: 2017-04-17 | Disposition: A | Discharge status: Home / Self Care | Payer: BLUE CROSS/BLUE SHIELD | Source: Ambulatory Visit | Attending: Obstetrics and Gynecology | Admitting: Obstetrics and Gynecology

## 2017-04-17 DIAGNOSIS — Z1231 Encounter for screening mammogram for malignant neoplasm of breast: Secondary | ICD-10-CM | POA: Diagnosis present

## 2017-06-02 ENCOUNTER — Other Ambulatory Visit: Payer: Self-pay | Admitting: Family Medicine

## 2017-06-14 ENCOUNTER — Other Ambulatory Visit: Payer: Self-pay | Admitting: Family Medicine

## 2017-06-14 MED ORDER — OSELTAMIVIR PHOSPHATE 75 MG PO CAPS: 75.0000 mg | ORAL_CAPSULE | Freq: Every day | ORAL | 0 refills | Status: DC

## 2017-06-18 ENCOUNTER — Encounter: Payer: BLUE CROSS/BLUE SHIELD | Admitting: Family Medicine

## 2017-06-20 ENCOUNTER — Encounter: Payer: Self-pay | Admitting: Family Medicine

## 2017-06-20 ENCOUNTER — Ambulatory Visit (INDEPENDENT_AMBULATORY_CARE_PROVIDER_SITE_OTHER): Payer: BLUE CROSS/BLUE SHIELD | Admitting: Family Medicine

## 2017-06-20 VITALS — BP 113/74 | HR 78 | Temp 98.1°F | Ht 63.0 in | Wt 147.4 lb

## 2017-06-20 DIAGNOSIS — R7301 Impaired fasting glucose: Secondary | ICD-10-CM | POA: Diagnosis not present

## 2017-06-20 DIAGNOSIS — L910 Hypertrophic scar: Secondary | ICD-10-CM | POA: Insufficient documentation

## 2017-06-20 DIAGNOSIS — D649 Anemia, unspecified: Secondary | ICD-10-CM | POA: Diagnosis not present

## 2017-06-20 DIAGNOSIS — E559 Vitamin D deficiency, unspecified: Secondary | ICD-10-CM | POA: Diagnosis not present

## 2017-06-20 DIAGNOSIS — Z0001 Encounter for general adult medical examination with abnormal findings: Secondary | ICD-10-CM | POA: Diagnosis not present

## 2017-06-20 DIAGNOSIS — E7800 Pure hypercholesterolemia, unspecified: Secondary | ICD-10-CM

## 2017-06-20 DIAGNOSIS — K219 Gastro-esophageal reflux disease without esophagitis: Secondary | ICD-10-CM

## 2017-06-20 DIAGNOSIS — Z0000 Encounter for general adult medical examination without abnormal findings: Secondary | ICD-10-CM

## 2017-06-20 LAB — UA/M W/RFLX CULTURE, ROUTINE
BILIRUBIN UA: NEGATIVE
GLUCOSE, UA: NEGATIVE
Ketones, UA: NEGATIVE
Leukocytes, UA: NEGATIVE
Nitrite, UA: NEGATIVE
PH UA: 5.5 (ref 5.0–7.5)
PROTEIN UA: NEGATIVE
Specific Gravity, UA: 1.025 (ref 1.005–1.030)
UUROB: 0.2 mg/dL (ref 0.2–1.0)

## 2017-06-20 LAB — MICROSCOPIC EXAMINATION

## 2017-06-20 LAB — MICROALBUMIN, URINE WAIVED
CREATININE, URINE WAIVED: 300 mg/dL (ref 10–300)
Microalb, Ur Waived: 30 mg/L — ABNORMAL HIGH (ref 0–19)
Microalb/Creat Ratio: <30 mg/g (ref <30)

## 2017-06-20 LAB — BAYER DCA HB A1C WAIVED: HB A1C (BAYER DCA - WAIVED): 5.4 % (ref <7.0)

## 2017-06-20 MED ORDER — PRAVASTATIN SODIUM 20 MG PO TABS: 20.0000 mg | ORAL_TABLET | Freq: Every day | ORAL | 1 refills | Status: DC

## 2017-06-20 MED ORDER — PANTOPRAZOLE SODIUM 40 MG PO TBEC: 40.0000 mg | DELAYED_RELEASE_TABLET | Freq: Two times a day (BID) | ORAL | 1 refills | Status: DC

## 2017-06-20 NOTE — Progress Notes (Signed)
BP 113/74 (BP Location: Left Arm, Patient Position: Sitting, Cuff Size: Normal)   Pulse 78   Temp 98.1 F (36.7 C)   Ht 5' 3"  (1.6 m)   Wt 147 lb 7 oz (66.9 kg)   SpO2 97%   BMI 26.12 kg/m    Subjective:    Patient ID: Judy Munoz, female    DOB: 11/22/1970, 47 y.o.   MRN: [id number]  HPI: [name] is a 47 y.o. female presenting on 06/20/2017 for comprehensive medical examination. Current medical complaints include:  Impaired Fasting Glucose HbA1C:  Lab Results  Component Value Date   HGBA1C 5.7 12/12/2016   Duration of elevated blood sugar: chronic Polydipsia: no Polyuria: no Weight change: no Visual disturbance: no Glucose Monitoring: no Diabetic Education: Not Completed Family history of diabetes: yes  HYPERLIPIDEMIA Hyperlipidemia status: excellent compliance Satisfied with current treatment?  yes Side effects:  no Medication compliance: excellent compliance Past cholesterol meds: pravastatin Supplements: none Aspirin:  no The 10-year ASCVD risk score [name], et al., 2013) is: 0.4%   Values used to calculate the score:     Age: 49 years     Sex: Female     Is Non-Hispanic African American: No     Diabetic: No     Tobacco smoker: No     Systolic Blood Pressure: 355 mmHg     Is BP treated: No     HDL Cholesterol: 63 mg/dL     Total Cholesterol: 170 mg/dL Chest pain:  no Coronary artery disease:  no Family history CAD:  yes  She currently lives with: husband and son Menopausal Symptoms: no  Depression Screen done today and results listed below:  Depression screen [facility] 2/9 06/20/2017 06/14/2016 12/13/2015  Decreased Interest 0 0 0  Down, Depressed, Hopeless 0 0 0  PHQ - 2 Score 0 0 0  Altered sleeping 0 - 0  Tired, decreased energy 1 - 0  Change in appetite 0 - 0  Feeling bad or failure about yourself  0 - 0  Trouble concentrating 0 - 0  Moving slowly or fidgety/restless 0 - 0  Suicidal thoughts 0 - 0  PHQ-9 Score 1 - 0  Difficult  doing work/chores Not difficult at all - -    Past Medical History:  Past Medical History:  Diagnosis Date  . Anemia    H/O WITH PREGNANCY  . Biliary dyskinesia   . Elevated glucose   . GERD (gastroesophageal reflux disease)   . Headache    MIGRAINES RARE  . Heart murmur    WAS TOLD THIS ONCE YEARS AGO-HAS NEVER BEEN TOLD SINCE  . High cholesterol   . History of uterine fibroid   . IFG (impaired fasting glucose)   . PONV (postoperative nausea and vomiting)    HAPPENED WITH HER 1ST EGD- HER 2ND EGD SHE WAS GIVEN ANTI-EMETIC IV AND HAD NO N/V  . Vitamin D deficiency     Surgical History:  Past Surgical History:  Procedure Laterality Date  . CHOLECYSTECTOMY N/A 07/18/2016   Procedure: LAPAROSCOPIC CHOLECYSTECTOMY;  Surgeon: Jules Husbands, MD;  Location: [facility];  Service: General;  Laterality: N/A;  . CHOLECYSTECTOMY, LAPAROSCOPIC  07/18/2016  . ESOPHAGOGASTRODUODENOSCOPY    . ESOPHAGOGASTRODUODENOSCOPY (EGD) WITH PROPOFOL N/A 07/26/2015   Procedure: ESOPHAGOGASTRODUODENOSCOPY (EGD) WITH PROPOFOL;  Surgeon: Lucilla Lame, MD;  Location: [place];  Service: Endoscopy;  Laterality: N/A;    Medications:  Current Outpatient Medications on File Prior to Visit  Medication Sig  . fexofenadine (ALLEGRA) 180 MG tablet Take 180 mg by mouth daily.  . cholecalciferol (VITAMIN D) 1000 UNITS tablet Take 1,000 Units by mouth daily.  . fluticasone (FLONASE) 50 MCG/ACT nasal spray Place 2 sprays into both nostrils 2 (two) times daily.   No current facility-administered medications on file prior to visit.     Allergies:  Allergies  Allergen Reactions  . Sulfa Antibiotics Hives    Social History:  Social History   Socioeconomic History  . Marital status: Married    Spouse name: Not on file  . Number of children: Not on file  . Years of education: Not on file  . Highest education level: Not on file  Social Needs  . Financial resource strain: Not on file  . Food  insecurity - worry: Not on file  . Food insecurity - inability: Not on file  . Transportation needs - medical: Not on file  . Transportation needs - non-medical: Not on file  Occupational History  . Not on file  Tobacco Use  . Smoking status: Never Smoker  . Smokeless tobacco: Never Used  Substance and Sexual Activity  . Alcohol use: No  . Drug use: No  . Sexual activity: Yes    Birth control/protection: None  Other Topics Concern  . Not on file  Social History Narrative  . Not on file   Social History   Tobacco Use  Smoking Status Never Smoker  Smokeless Tobacco Never Used   Social History   Substance and Sexual Activity  Alcohol Use No    Family History:  Family History  Problem Relation Age of Onset  . Diabetes Father   . Heart disease Father   . Hypertension Father   . Hyperlipidemia Father   . Healthy Mother   . Cancer Maternal Grandmother        uterine  . Breast cancer Maternal Grandmother 21  . Lung cancer Maternal Uncle     Past medical history, surgical history, medications, allergies, family history and social history reviewed with patient today and changes made to appropriate areas of the chart.   Review of Systems  Constitutional: Negative.   HENT: Negative.   Eyes: Negative.   Respiratory: Negative.   Cardiovascular: Negative.   Gastrointestinal: Negative.   Genitourinary: Negative.   Musculoskeletal: Positive for myalgias. Negative for back pain, falls, joint pain and neck pain.  Skin: Negative.   Neurological: Negative.   Endo/Heme/Allergies: Positive for environmental allergies. Negative for polydipsia. Does not bruise/bleed easily.  Psychiatric/Behavioral: Negative.     All other ROS negative except what is listed above and in the HPI.      Objective:    BP 113/74 (BP Location: Left Arm, Patient Position: Sitting, Cuff Size: Normal)   Pulse 78   Temp 98.1 F (36.7 C)   Ht 5' 3"  (1.6 m)   Wt 147 lb 7 oz (66.9 kg)   SpO2 97%    BMI 26.12 kg/m   Wt Readings from Last 3 Encounters:  06/20/17 147 lb 7 oz (66.9 kg)  04/02/17 148 lb 12.8 oz (67.5 kg)  03/19/17 148 lb 1.6 oz (67.2 kg)    Physical Exam  Constitutional: She is oriented to person, place, and time. She appears well-developed and well-nourished. No distress.  HENT:  Head: Normocephalic and atraumatic.  Right Ear: Hearing, tympanic membrane, external ear and ear canal normal.  Left Ear: Hearing, tympanic membrane, external ear and ear canal normal.  Nose: Nose normal.  Mouth/Throat: Uvula is midline, oropharynx is clear and moist and mucous membranes are normal. No oropharyngeal exudate.  Eyes: Conjunctivae, EOM and lids are normal. Pupils are equal, round, and reactive to light. Right eye exhibits no discharge. Left eye exhibits no discharge. No scleral icterus.  Neck: Normal range of motion. Neck supple. No JVD present. No tracheal deviation present. No thyromegaly present.  Cardiovascular: Normal rate, regular rhythm, normal heart sounds and intact distal pulses. Exam reveals no gallop and no friction rub.  No murmur heard. Pulmonary/Chest: Effort normal and breath sounds normal. No stridor. No respiratory distress. She has no wheezes. She has no rales. She exhibits no tenderness.  Abdominal: Soft. Bowel sounds are normal. She exhibits no distension and no mass. There is no tenderness. There is no rebound and no guarding.  Genitourinary:  Genitourinary Comments: Breast and pelvic exams deferred- done at gyn  Musculoskeletal: Normal range of motion. She exhibits no edema, tenderness or deformity.  Lymphadenopathy:    She has no cervical adenopathy.  Neurological: She is alert and oriented to person, place, and time. She has normal reflexes. She displays normal reflexes. No cranial nerve deficit. She exhibits normal muscle tone. Coordination normal.  Skin: Skin is warm, dry and intact. No rash noted. She is not diaphoretic. No erythema. No pallor.  Keloid  on upper R back  Psychiatric: She has a normal mood and affect. Her speech is normal and behavior is normal. Judgment and thought content normal. Cognition and memory are normal.  Nursing note and vitals reviewed.   Results for orders placed or performed in visit on 03/19/17  Rapid Strep screen  Result Value Ref Range   Strep Gp A Ag, IA W/Reflex Negative Negative  Culture, Group A Strep  Result Value Ref Range   Strep A Culture Negative       Assessment & Plan:   Problem List Items Addressed This Visit      Digestive   GERD (gastroesophageal reflux disease)    Stable on protonix. Continue to monitor. Call with any concerns.       Relevant Medications   pantoprazole (PROTONIX) 40 MG tablet   Other Relevant Orders   Comprehensive metabolic panel   TSH     Endocrine   IFG (impaired fasting glucose)    Under good control. Continue diet and exercise. Call with any concerns.       Relevant Orders   Bayer DCA Hb A1c Waived   Comprehensive metabolic panel   Microalbumin, Urine Waived   TSH   UA/M w/rflx Culture, Routine     Musculoskeletal and Integument   Scarring, keloid    Making note in case she needs to have surgery/skin biopsy again        Other   High cholesterol    Rechecking levels today. Await results. Call with any concerns. Refills given today. Call with any concerns.       Relevant Medications   pravastatin (PRAVACHOL) 20 MG tablet   Other Relevant Orders   Comprehensive metabolic panel   Lipid Panel w/o Chol/HDL Ratio   TSH   Vitamin D deficiency    Rechecking levels today. Await results. Call with any concerns.       Relevant Orders   Comprehensive metabolic panel   TSH   VITAMIN D 25 Hydroxy (Vit-D Deficiency, Fractures)   Anemia    Rechecking levels today. Await results. Call with any concerns.       Relevant Orders   CBC with  Differential/Platelet   Comprehensive metabolic panel   TSH    Other Visit Diagnoses    Routine general  medical examination at a health care facility    -  Primary   Vaccines up to date. Screening labs checked today. Pap and mammo done through GYN. Call with any concerns. Continue diet and exercise.        Follow up plan: Return in about 6 months (around 12/18/2017) for follow up cholesterol.   LABORATORY TESTING:  - Pap smear: done elsewhere- up to date  IMMUNIZATIONS:   - Tdap: Tetanus vaccination status reviewed: last tetanus booster within 10 years. - Influenza: Up to date  SCREENING: -Mammogram: Up to date   PATIENT COUNSELING:   Advised to take 1 mg of folate supplement per day if capable of pregnancy.   Sexuality: Discussed sexually transmitted diseases, partner selection, use of condoms, avoidance of unintended pregnancy  and contraceptive alternatives.   Advised to avoid cigarette smoking.  I discussed with the patient that most people either abstain from alcohol or drink within safe limits (<=14/week and <=4 drinks/occasion for males, <=7/weeks and <= 3 drinks/occasion for females) and that the risk for alcohol disorders and other health effects rises proportionally with the number of drinks per week and how often a drinker exceeds daily limits.  Discussed cessation/primary prevention of drug use and availability of treatment for abuse.   Diet: Encouraged to adjust caloric intake to maintain  or achieve ideal body weight, to reduce intake of dietary saturated fat and total fat, to limit sodium intake by avoiding high sodium foods and not adding table salt, and to maintain adequate dietary potassium and calcium preferably from fresh fruits, vegetables, and low-fat dairy products.    stressed the importance of regular exercise  Injury prevention: Discussed safety belts, safety helmets, smoke detector, smoking near bedding or upholstery.   Dental health: Discussed importance of regular tooth brushing, flossing, and dental visits.    NEXT PREVENTATIVE PHYSICAL DUE IN 1  YEAR. Return in about 6 months (around 12/18/2017) for follow up cholesterol.

## 2017-06-20 NOTE — Assessment & Plan Note (Signed)
Stable on protonix. Continue to monitor. Call with any concerns.

## 2017-06-20 NOTE — Assessment & Plan Note (Signed)
Rechecking levels today. Await results. Call with any concerns.  

## 2017-06-20 NOTE — Assessment & Plan Note (Signed)
Rechecking levels today. Await results. Call with any concerns. Refills given today. Call with any concerns.

## 2017-06-20 NOTE — Assessment & Plan Note (Signed)
Making note in case she needs to have surgery/skin biopsy again

## 2017-06-20 NOTE — Assessment & Plan Note (Signed)
Under good control. Continue diet and exercise. Call with any concerns.

## 2017-06-20 NOTE — Patient Instructions (Addendum)
[facility]  Health Maintenance, Female Adopting a healthy lifestyle and getting preventive care can go a long way to promote health and wellness. Talk with your health care provider about what schedule of regular examinations is right for you. This is a good chance for you to check in with your provider about disease prevention and staying healthy. In between checkups, there are plenty of things you can do on your own. Experts have done a lot of research about which lifestyle changes and preventive measures are most likely to keep you healthy. Ask your health care provider for more information. Weight and diet Eat a healthy diet  Be sure to include plenty of vegetables, fruits, low-fat dairy products, and lean protein.  Do not eat a lot of foods high in solid fats, added sugars, or salt.  Get regular exercise. This is one of the most important things you can do for your health. ? Most adults should exercise for at least 150 minutes each week. The exercise should increase your heart rate and make you sweat (moderate-intensity exercise). ? Most adults should also do strengthening exercises at least twice a week. This is in addition to the moderate-intensity exercise.  Maintain a healthy weight  Body mass index (BMI) is a measurement that can be used to identify possible weight problems. It estimates body fat based on height and weight. Your health care provider can help determine your BMI and help you achieve or maintain a healthy weight.  For females 90 years of age and older: ? A BMI below 18.5 is considered underweight. ? A BMI of 18.5 to 24.9 is normal. ? A BMI of 25 to 29.9 is considered overweight. ? A BMI of 30 and above is considered obese.  Watch levels of cholesterol and blood lipids  You should start having your blood tested for lipids and cholesterol at 46 years of age, then have this test every 5 years.  You may need to have your cholesterol levels checked more  often if: ? Your lipid or cholesterol levels are high. ? You are older than 46 years of age. ? You are at high risk for heart disease.  Cancer screening Lung Cancer  Lung cancer screening is recommended for adults 76-26 years old who are at high risk for lung cancer because of a history of smoking.  A yearly low-dose CT scan of the lungs is recommended for people who: ? Currently smoke. ? Have quit within the past 15 years. ? Have at least a 30-pack-year history of smoking. A pack year is smoking an average of one pack of cigarettes a day for 1 year.  Yearly screening should continue until it has been 15 years since you quit.  Yearly screening should stop if you develop a health problem that would prevent you from having lung cancer treatment.  Breast Cancer  Practice breast self-awareness. This means understanding how your breasts normally appear and feel.  It also means doing regular breast self-exams. Let your health care provider know about any changes, no matter how small.  If you are in your 20s or 30s, you should have a clinical breast exam (CBE) by a health care provider every 1-3 years as part of a regular health exam.  If you are 61 or older, have a CBE every year. Also consider having a breast X-ray (mammogram) every year.  If you have a family history of breast cancer, talk to your health care provider about genetic screening.  If you  are at high risk for breast cancer, talk to your health care provider about having an MRI and a mammogram every year.  Breast cancer gene (BRCA) assessment is recommended for women who have family members with BRCA-related cancers. BRCA-related cancers include: ? Breast. ? Ovarian. ? Tubal. ? Peritoneal cancers.  Results of the assessment will determine the need for genetic counseling and BRCA1 and BRCA2 testing.  Cervical Cancer Your health care provider may recommend that you be screened regularly for cancer of the pelvic organs  (ovaries, uterus, and vagina). This screening involves a pelvic examination, including checking for microscopic changes to the surface of your cervix (Pap test). You may be encouraged to have this screening done every 3 years, beginning at age 19.  For women ages 91-65, health care providers may recommend pelvic exams and Pap testing every 3 years, or they may recommend the Pap and pelvic exam, combined with testing for human papilloma virus (HPV), every 5 years. Some types of HPV increase your risk of cervical cancer. Testing for HPV may also be done on women of any age with unclear Pap test results.  Other health care providers may not recommend any screening for nonpregnant women who are considered low risk for pelvic cancer and who do not have symptoms. Ask your health care provider if a screening pelvic exam is right for you.  If you have had past treatment for cervical cancer or a condition that could lead to cancer, you need Pap tests and screening for cancer for at least 20 years after your treatment. If Pap tests have been discontinued, your risk factors (such as having a new sexual partner) need to be reassessed to determine if screening should resume. Some women have medical problems that increase the chance of getting cervical cancer. In these cases, your health care provider may recommend more frequent screening and Pap tests.  Colorectal Cancer  This type of cancer can be detected and often prevented.  Routine colorectal cancer screening usually begins at 46 years of age and continues through 46 years of age.  Your health care provider may recommend screening at an earlier age if you have risk factors for colon cancer.  Your health care provider may also recommend using home test kits to check for hidden blood in the stool.  A small camera at the end of a tube can be used to examine your colon directly (sigmoidoscopy or colonoscopy). This is done to check for the earliest forms of  colorectal cancer.  Routine screening usually begins at age 61.  Direct examination of the colon should be repeated every 5-10 years through 46 years of age. However, you may need to be screened more often if early forms of precancerous polyps or small growths are found.  Skin Cancer  Check your skin from head to toe regularly.  Tell your health care provider about any new moles or changes in moles, especially if there is a change in a mole's shape or color.  Also tell your health care provider if you have a mole that is larger than the size of a pencil eraser.  Always use sunscreen. Apply sunscreen liberally and repeatedly throughout the day.  Protect yourself by wearing long sleeves, pants, a wide-brimmed hat, and sunglasses whenever you are outside.  Heart disease, diabetes, and high blood pressure  High blood pressure causes heart disease and increases the risk of stroke. High blood pressure is more likely to develop in: ? People who have blood pressure in  the high end of the normal range (130-139/85-89 mm Hg). ? People who are overweight or obese. ? People who are African American.  If you are 18-39 years of age, have your blood pressure checked every 3-5 years. If you are 40 years of age or older, have your blood pressure checked every year. You should have your blood pressure measured twice-once when you are at a hospital or clinic, and once when you are not at a hospital or clinic. Record the average of the two measurements. To check your blood pressure when you are not at a hospital or clinic, you can use: ? An automated blood pressure machine at a pharmacy. ? A home blood pressure monitor.  If you are between 55 years and 79 years old, ask your health care provider if you should take aspirin to prevent strokes.  Have regular diabetes screenings. This involves taking a blood sample to check your fasting blood sugar level. ? If you are at a normal weight and have a low risk  for diabetes, have this test once every three years after 45 years of age. ? If you are overweight and have a high risk for diabetes, consider being tested at a younger age or more often. Preventing infection Hepatitis B  If you have a higher risk for hepatitis B, you should be screened for this virus. You are considered at high risk for hepatitis B if: ? You were born in a country where hepatitis B is common. Ask your health care provider which countries are considered high risk. ? Your parents were born in a high-risk country, and you have not been immunized against hepatitis B (hepatitis B vaccine). ? You have HIV or AIDS. ? You use needles to inject street drugs. ? You live with someone who has hepatitis B. ? You have had sex with someone who has hepatitis B. ? You get hemodialysis treatment. ? You take certain medicines for conditions, including cancer, organ transplantation, and autoimmune conditions.  Hepatitis C  Blood testing is recommended for: ? Everyone born from 1945 through 1965. ? Anyone with known risk factors for hepatitis C.  Sexually transmitted infections (STIs)  You should be screened for sexually transmitted infections (STIs) including gonorrhea and chlamydia if: ? You are sexually active and are younger than 46 years of age. ? You are older than 46 years of age and your health care provider tells you that you are at risk for this type of infection. ? Your sexual activity has changed since you were last screened and you are at an increased risk for chlamydia or gonorrhea. Ask your health care provider if you are at risk.  If you do not have HIV, but are at risk, it may be recommended that you take a prescription medicine daily to prevent HIV infection. This is called pre-exposure prophylaxis (PrEP). You are considered at risk if: ? You are sexually active and do not regularly use condoms or know the HIV status of your partner(s). ? You take drugs by  injection. ? You are sexually active with a partner who has HIV.  Talk with your health care provider about whether you are at high risk of being infected with HIV. If you choose to begin PrEP, you should first be tested for HIV. You should then be tested every 3 months for as long as you are taking PrEP. Pregnancy  If you are premenopausal and you may become pregnant, ask your health care provider about preconception counseling.    If you may become pregnant, take 400 to 800 micrograms (mcg) of folic acid every day.  If you want to prevent pregnancy, talk to your health care provider about birth control (contraception). Osteoporosis and menopause  Osteoporosis is a disease in which the bones lose minerals and strength with aging. This can result in serious bone fractures. Your risk for osteoporosis can be identified using a bone density scan.  If you are 19 years of age or older, or if you are at risk for osteoporosis and fractures, ask your health care provider if you should be screened.  Ask your health care provider whether you should take a calcium or vitamin D supplement to lower your risk for osteoporosis.  Menopause may have certain physical symptoms and risks.  Hormone replacement therapy may reduce some of these symptoms and risks. Talk to your health care provider about whether hormone replacement therapy is right for you. Follow these instructions at home:  Schedule regular health, dental, and eye exams.  Stay current with your immunizations.  Do not use any tobacco products including cigarettes, chewing tobacco, or electronic cigarettes.  If you are pregnant, do not drink alcohol.  If you are breastfeeding, limit how much and how often you drink alcohol.  Limit alcohol intake to no more than 1 drink per day for nonpregnant women. One drink equals 12 ounces of beer, 5 ounces of wine, or 1 ounces of hard liquor.  Do not use street drugs.  Do not share needles.  Ask  your health care provider for help if you need support or information about quitting drugs.  Tell your health care provider if you often feel depressed.  Tell your health care provider if you have ever been abused or do not feel safe at home. This information is not intended to replace advice given to you by your health care provider. Make sure you discuss any questions you have with your health care provider. Document Released: 10/31/2010 Document Revised: 09/23/2015 Document Reviewed: 01/19/2015 Elsevier Interactive Patient Education  [employer].

## 2017-06-21 LAB — COMPREHENSIVE METABOLIC PANEL
ALBUMIN: 4.5 g/dL (ref 3.5–5.5)
ALT: 18 IU/L (ref 0–32)
AST: 18 IU/L (ref 0–40)
Albumin/Globulin Ratio: 1.8 (ref 1.2–2.2)
Alkaline Phosphatase: 48 IU/L (ref 39–117)
BILIRUBIN TOTAL: 0.3 mg/dL (ref 0.0–1.2)
BUN / CREAT RATIO: 10 (ref 9–23)
BUN: 8 mg/dL (ref 6–24)
CHLORIDE: 101 mmol/L (ref 96–106)
CO2: 24 mmol/L (ref 20–29)
CREATININE: 0.77 mg/dL (ref 0.57–1.00)
Calcium: 9.5 mg/dL (ref 8.7–10.2)
GFR calc Af Amer: 108 mL/min/{1.73_m2} (ref >59)
GFR calc non Af Amer: 94 mL/min/{1.73_m2} (ref >59)
GLOBULIN, TOTAL: 2.5 g/dL (ref 1.5–4.5)
GLUCOSE: 99 mg/dL (ref 65–99)
Potassium: 4.3 mmol/L (ref 3.5–5.2)
SODIUM: 140 mmol/L (ref 134–144)
Total Protein: 7 g/dL (ref 6.0–8.5)

## 2017-06-21 LAB — CBC WITH DIFFERENTIAL/PLATELET
BASOS ABS: 0 10*3/uL (ref 0.0–0.2)
Basos: 0 %
EOS (ABSOLUTE): 0.1 10*3/uL (ref 0.0–0.4)
Eos: 1 %
Hematocrit: 39.1 % (ref 34.0–46.6)
Hemoglobin: 13 g/dL (ref 11.1–15.9)
Immature Grans (Abs): 0 10*3/uL (ref 0.0–0.1)
Immature Granulocytes: 0 %
LYMPHS ABS: 2.1 10*3/uL (ref 0.7–3.1)
Lymphs: 34 %
MCH: 28.3 pg (ref 26.6–33.0)
MCHC: 33.2 g/dL (ref 31.5–35.7)
MCV: 85 fL (ref 79–97)
MONOS ABS: 0.4 10*3/uL (ref 0.1–0.9)
Monocytes: 6 %
NEUTROS ABS: 3.6 10*3/uL (ref 1.4–7.0)
Neutrophils: 59 %
Platelets: 330 10*3/uL (ref 150–379)
RBC: 4.6 x10E6/uL (ref 3.77–5.28)
RDW: 14.3 % (ref 12.3–15.4)
WBC: 6.1 10*3/uL (ref 3.4–10.8)

## 2017-06-21 LAB — LIPID PANEL W/O CHOL/HDL RATIO
CHOLESTEROL TOTAL: 167 mg/dL (ref 100–199)
HDL: 58 mg/dL (ref >39)
LDL CALC: 90 mg/dL (ref 0–99)
TRIGLYCERIDES: 97 mg/dL (ref 0–149)
VLDL Cholesterol Cal: 19 mg/dL (ref 5–40)

## 2017-06-21 LAB — VITAMIN D 25 HYDROXY (VIT D DEFICIENCY, FRACTURES): Vit D, 25-Hydroxy: 36.9 ng/mL (ref 30.0–100.0)

## 2017-06-21 LAB — TSH: TSH: 1.53 u[IU]/mL (ref 0.450–4.500)

## 2017-07-22 ENCOUNTER — Encounter: Payer: Self-pay | Admitting: Gynecology

## 2017-07-22 ENCOUNTER — Other Ambulatory Visit: Payer: Self-pay

## 2017-07-22 ENCOUNTER — Ambulatory Visit
Admission: EM | Admit: 2017-07-22 | Discharge: 2017-07-22 | Disposition: A | Discharge status: Home / Self Care | Payer: BLUE CROSS/BLUE SHIELD | Attending: Family Medicine | Admitting: Family Medicine

## 2017-07-22 DIAGNOSIS — R21 Rash and other nonspecific skin eruption: Secondary | ICD-10-CM | POA: Diagnosis not present

## 2017-07-22 DIAGNOSIS — L259 Unspecified contact dermatitis, unspecified cause: Secondary | ICD-10-CM

## 2017-07-22 MED ORDER — METHYLPREDNISOLONE SODIUM SUCC 40 MG IJ SOLR
80.0000 mg | Freq: Once | INTRAMUSCULAR | Status: AC
  Administered 2017-07-22: 80 mg via INTRAMUSCULAR

## 2017-07-22 MED ORDER — PREDNISONE 10 MG (21) PO TBPK: ORAL_TABLET | ORAL | 0 refills | Status: DC

## 2017-07-22 MED ORDER — HYDROXYZINE HCL 25 MG PO TABS: 25.0000 mg | ORAL_TABLET | Freq: Four times a day (QID) | ORAL | 0 refills | Status: DC | PRN

## 2017-07-22 NOTE — ED Provider Notes (Signed)
[facility]    CSN: [id number] Arrival date & time: 07/22/17  1043     History   Chief Complaint Chief Complaint  Patient presents with  . Rash    HPI Judy Munoz is a 47 y.o. female.   47 year old female presents with rash that started on her face/forehead that has spread to multiple areas on her face and bilaterally on her lower arms. Went to a wedding in [place] this past weekend. Noticed one spot on her forehead 2 days ago that was red and itchy. Later that day rash had spread to multiple areas of her face and yesterday spread to her lower arms. Very itchy and warm. Denies any lesions on her neck, trunk, back or legs. She has tried applying Benadryl cream and taking oral Benadryl with minimal relief. No known change in soaps, detergents or unusual foods. Only different activity was she swam in a salt-water pool and broke out in the rash a few hours later. No other family members with similar symptoms. Has history of hyperlipidemia, GERD and seasonal allergies and takes Pravachol, Protonix and Flonase prn. Also took an Human resources officer today with minimal relief.   The history is provided by the patient.    Past Medical History:  Diagnosis Date  . Anemia    H/O WITH PREGNANCY  . Biliary dyskinesia   . Elevated glucose   . GERD (gastroesophageal reflux disease)   . Headache    MIGRAINES RARE  . Heart murmur    WAS TOLD THIS ONCE YEARS AGO-HAS NEVER BEEN TOLD SINCE  . High cholesterol   . History of uterine fibroid   . IFG (impaired fasting glucose)   . PONV (postoperative nausea and vomiting)    HAPPENED WITH HER 1ST EGD- HER 2ND EGD SHE WAS GIVEN ANTI-EMETIC IV AND HAD NO N/V  . Vitamin D deficiency     Patient Active Problem List   Diagnosis Date Noted  . Scarring, keloid 06/20/2017  . Anemia   . Heart murmur   . Gastritis   . Overweight (BMI 25.0-29.9) 03/23/2015  . GERD (gastroesophageal reflux disease)   . High cholesterol   . Vitamin D deficiency   .  IFG (impaired fasting glucose)     Past Surgical History:  Procedure Laterality Date  . CHOLECYSTECTOMY N/A 07/18/2016   Procedure: LAPAROSCOPIC CHOLECYSTECTOMY;  Surgeon: Jules Husbands, MD;  Location: [facility];  Service: General;  Laterality: N/A;  . CHOLECYSTECTOMY, LAPAROSCOPIC  07/18/2016  . ESOPHAGOGASTRODUODENOSCOPY    . ESOPHAGOGASTRODUODENOSCOPY (EGD) WITH PROPOFOL N/A 07/26/2015   Procedure: ESOPHAGOGASTRODUODENOSCOPY (EGD) WITH PROPOFOL;  Surgeon: Lucilla Lame, MD;  Location: [place];  Service: Endoscopy;  Laterality: N/A;    OB History    Gravida  1   Para  1   Term  1   Preterm      AB      Living  1     SAB      TAB      Ectopic      Multiple      Live Births  1            Home Medications    Prior to Admission medications   Medication Sig Start Date End Date Taking? Authorizing Provider  cholecalciferol (VITAMIN D) 1000 UNITS tablet Take 1,000 Units by mouth daily.   Yes [provider]  fexofenadine (ALLEGRA) 180 MG tablet Take 180 mg by mouth daily.   Yes [provider]  fluticasone (FLONASE) 50 MCG/ACT nasal spray Place 2 sprays into both nostrils 2 (two) times daily. 04/02/17  Yes Volney American, PA-C  pantoprazole (PROTONIX) 40 MG tablet Take 1 tablet (40 mg total) by mouth 2 (two) times daily. 06/20/17  Yes Johnson, Megan P, DO  pravastatin (PRAVACHOL) 20 MG tablet Take 1 tablet (20 mg total) by mouth at bedtime. 06/20/17  Yes Johnson, Megan P, DO  hydrOXYzine (ATARAX/VISTARIL) 25 MG tablet Take 1 tablet (25 mg total) by mouth every 6 (six) hours as needed for itching. 07/22/17   Katy Apo, NP  predniSONE (STERAPRED UNI-PAK 21 TAB) 10 MG (21) TBPK tablet Take 6 tabs by mouth on the first day then decrease by 1 tablet each day until finished on day 6 07/22/17   Yesha Muchow, Nicholes Stairs, NP    Family History Family History  Problem Relation Age of Onset  . Diabetes Father   . Heart disease Father   .  Hypertension Father   . Hyperlipidemia Father   . Healthy Mother   . Cancer Maternal Grandmother        uterine  . Breast cancer Maternal Grandmother 56  . Lung cancer Maternal Uncle     Social History Social History   Tobacco Use  . Smoking status: Never Smoker  . Smokeless tobacco: Never Used  Substance Use Topics  . Alcohol use: No  . Drug use: No     Allergies   Sulfa antibiotics   Review of Systems Review of Systems  Constitutional: Negative for activity change, appetite change, chills, fatigue and fever.  HENT: Negative for congestion, ear discharge, ear pain, facial swelling, mouth sores, postnasal drip, sore throat and trouble swallowing.   Eyes: Negative for photophobia, pain, discharge, redness, itching and visual disturbance.  Respiratory: Negative for cough, chest tightness, shortness of breath and wheezing.   Gastrointestinal: Negative for nausea and vomiting.  Musculoskeletal: Negative for arthralgias, myalgias, neck pain and neck stiffness.  Skin: Positive for rash.  Neurological: Negative for dizziness, tremors, seizures, syncope, facial asymmetry, weakness, light-headedness, numbness and headaches.  Hematological: Negative for adenopathy. Does not bruise/bleed easily.     Physical Exam Triage Vital Signs ED Triage Vitals  Enc Vitals Group     BP 07/22/17 1131 134/81     Pulse Rate 07/22/17 1131 72     Resp --      Temp 07/22/17 1131 97.8 F (36.6 C)     Temp Source 07/22/17 1131 Oral     SpO2 07/22/17 1131 100 %     Weight 07/22/17 1133 147 lb (66.7 kg)     Height 07/22/17 1133 5' 3"  (1.6 m)     Head Circumference --      Peak Flow --      Pain Score 07/22/17 1134 0     Pain Loc --      Pain Edu? --      Excl. in [place]? --    No data found.  Updated Vital Signs BP 134/81 (BP Location: Left Arm)   Pulse 72   Temp 97.8 F (36.6 C) (Oral)   Ht 5' 3"  (1.6 m)   Wt 147 lb (66.7 kg)   LMP 07/01/2017   SpO2 100%   BMI 26.04 kg/m   Visual  Acuity Right Eye Distance:   Left Eye Distance:   Bilateral Distance:    Right Eye Near:   Left Eye Near:    Bilateral Near:     Physical  Exam  Constitutional: She is oriented to person, place, and time. She appears well-developed and well-nourished. She is cooperative. No distress.  HENT:  Head: Normocephalic.    Right Ear: Hearing, tympanic membrane, external ear and ear canal normal.  Left Ear: Hearing, tympanic membrane, external ear and ear canal normal.  Nose: Nose normal.  Mouth/Throat: Uvula is midline, oropharynx is clear and moist and mucous membranes are normal. No oral lesions.  Eyes: Pupils are equal, round, and reactive to light. Conjunctivae and EOM are normal.    Neck: Normal range of motion. Neck supple.  Cardiovascular: Normal rate and regular rhythm.  Pulmonary/Chest: Effort normal and breath sounds normal. No respiratory distress. She has no decreased breath sounds. She has no wheezes. She has no rhonchi.  Musculoskeletal: Normal range of motion.       Arms: Lymphadenopathy:    She has no cervical adenopathy.  Neurological: She is alert and oriented to person, place, and time.  Skin: Skin is warm, dry and intact. Capillary refill takes less than 2 seconds. Rash noted. Rash is papular and urticarial.  Multiple papular lesions present in no distinct pattern on her face. No vesicles or involvement of eyelids except one small lesion on left upper outer eyelid. Lesions range in size from 82m to 251m No distinct discharge or crusting. Non-tender.  About 3 to 4 larger papular with urticarial findings present on both lower arms. No discharge or crusting. No signs of secondary bacterial infection.  No other lesions seen on her body.   Psychiatric: She has a normal mood and affect. Her behavior is normal. Judgment and thought content normal.     UC Treatments / Results  Labs (all labs ordered are listed, but only abnormal results are displayed) Labs Reviewed - No  data to display  EKG None Radiology No results found.  Procedures Procedures (including critical care time)  Medications Ordered in UC Medications  methylPREDNISolone sodium succinate (SOLU-MEDROL) 40 mg/mL injection 80 mg (80 mg Intramuscular Given 07/22/17 1358)     Initial Impression / Assessment and Plan / UC Course  I have reviewed the triage vital signs and the nursing notes.  Pertinent labs & imaging results that were available during my care of the patient were reviewed by me and considered in my medical decision making (see chart for details).    Discussed with patient that she appears to have some form of contact dermatitis. Doubt mite/bed bug or other insect bite due to various locations and no other family members with symptoms (since they all swam in same pool and husband slept in same bed at hotel). Gave SoluMedrol 8035mM now to help with rash and itching. Recommend start Vistaril 55m45mery 6 hours as needed for itching. Apply cool compresses to area for comfort. May start Prednisone 10mg62may dose pack as directed. Continue to monitor rash. Follow-up here if minimal improvement in rash within 2 to 3 days.   Final Clinical Impressions(s) / UC Diagnoses   Final diagnoses:  Contact dermatitis, unspecified contact dermatitis type, unspecified trigger    ED Discharge Orders        Ordered    predniSONE (STERAPRED UNI-PAK 21 TAB) 10 MG (21) TBPK tablet     07/22/17 1353    hydrOXYzine (ATARAX/VISTARIL) 25 MG tablet  Every 6 hours PRN     07/22/17 1353       Controlled Substance Prescriptions [place] Controlled Substance Registry consulted? Not Applicable   AmyotKaty Apo03/24/19  2208  

## 2017-07-22 NOTE — ED Triage Notes (Signed)
Per patient c/o rash on face and arm x yesterday.per orders patient was in [place] x yesterday.

## 2017-07-22 NOTE — Discharge Instructions (Signed)
You were given a shot of SoluMedrol (steroid) today to help with the itching and rash. Recommend take Hydroxyzine 70m every 6 hours as needed for itching. May start Prednisone 152mtablets- take 6 tablets today then decrease by 1 tablet each day until finished on day 6. Continue to monitor symptoms. If minimal improvement within 2 to 3 days, return for recheck.

## 2017-09-17 ENCOUNTER — Encounter: Payer: Self-pay | Admitting: Family Medicine

## 2017-09-17 ENCOUNTER — Ambulatory Visit: Payer: BLUE CROSS/BLUE SHIELD | Admitting: Family Medicine

## 2017-09-17 VITALS — BP 148/86 | HR 78 | Temp 98.2°F | Wt 148.1 lb

## 2017-09-17 DIAGNOSIS — R066 Hiccough: Secondary | ICD-10-CM | POA: Diagnosis not present

## 2017-09-17 DIAGNOSIS — Z91038 Other insect allergy status: Secondary | ICD-10-CM | POA: Diagnosis not present

## 2017-09-17 MED ORDER — CYCLOBENZAPRINE HCL 10 MG PO TABS: 10.0000 mg | ORAL_TABLET | Freq: Every day | ORAL | 0 refills | Status: DC

## 2017-09-17 MED ORDER — TRIAMCINOLONE ACETONIDE 40 MG/ML IJ SUSP
40.0000 mg | Freq: Once | INTRAMUSCULAR | Status: AC
  Administered 2017-09-17: 40 mg via INTRAMUSCULAR

## 2017-09-17 MED ORDER — PREDNISONE 10 MG (21) PO TBPK: ORAL_TABLET | ORAL | 0 refills | Status: DC

## 2017-09-17 NOTE — Patient Instructions (Addendum)
Zantac- 2x a day' Zyrtec in AM Bendaryl in PM

## 2017-09-17 NOTE — Progress Notes (Signed)
BP (!) 148/86 (BP Location: Left Arm, Patient Position: Sitting, Cuff Size: Normal)   Pulse 78   Temp 98.2 F (36.8 C)   Wt 148 lb 2 oz (67.2 kg)   SpO2 97%   BMI 26.24 kg/m    Subjective:    Patient ID: [name], female    DOB: April 15, 1971, 47 y.o.   MRN: [id number]  HPI: RHONDA LINAN is a 47 y.o. female  Chief Complaint  Patient presents with  . Rash   RASH Duration:  2 days  Location: trunk and arms  Itching: yes Burning: yes Redness: yes Oozing: no Scaling: no Blisters: no Painful: no Fevers: no Change in detergents/soaps/personal care products: no Recent illness: no Recent travel:no History of same: yes- mosquito bites a few weeks ago Context: worse Alleviating factors: getting into the pool Treatments attempted:nothing Shortness of breath: no  Throat/tongue swelling: no Myalgias/arthralgias: no  Relevant past medical, surgical, family and social history reviewed and updated as indicated. Interim medical history since our last visit reviewed. Allergies and medications reviewed and updated.  Review of Systems  Per HPI unless specifically indicated above     Objective:    BP (!) 148/86 (BP Location: Left Arm, Patient Position: Sitting, Cuff Size: Normal)   Pulse 78   Temp 98.2 F (36.8 C)   Wt 148 lb 2 oz (67.2 kg)   SpO2 97%   BMI 26.24 kg/m   Wt Readings from Last 3 Encounters:  09/17/17 148 lb 2 oz (67.2 kg)  07/22/17 147 lb (66.7 kg)  06/20/17 147 lb 7 oz (66.9 kg)    Physical Exam  Constitutional: She is oriented to person, place, and time. She appears well-developed and well-nourished. No distress.  HENT:  Head: Normocephalic and atraumatic.  Right Ear: Hearing normal.  Left Ear: Hearing normal.  Nose: Nose normal.  Eyes: Conjunctivae and lids are normal. Right eye exhibits no discharge. Left eye exhibits no discharge. No scleral icterus.  Cardiovascular: Normal rate, regular rhythm, normal heart sounds and intact distal  pulses. Exam reveals no gallop and no friction rub.  No murmur heard. Pulmonary/Chest: Effort normal and breath sounds normal. No stridor. No respiratory distress. She has no wheezes. She has no rales. She exhibits no tenderness.  Musculoskeletal: Normal range of motion.  Neurological: She is alert and oriented to person, place, and time.  Skin: Skin is warm, dry and intact. Capillary refill takes less than 2 seconds. Rash noted. She is not diaphoretic. There is erythema. No pallor.  welps around mosquito bites on her arms, chest and back  Psychiatric: She has a normal mood and affect. Her speech is normal and behavior is normal. Judgment and thought content normal. Cognition and memory are normal.  Nursing note and vitals reviewed.   Results for orders placed or performed in visit on 06/20/17  Microscopic Examination  Result Value Ref Range   WBC, UA 0-5 0 - 5 /hpf   RBC, UA 0-2 0 - 2 /hpf   Epithelial Cells (non renal) 0-10 0 - 10 /hpf   Bacteria, UA Few None seen/Few  Bayer DCA Hb A1c Waived  Result Value Ref Range   Bayer DCA Hb A1c Waived 5.4 <7.0 %  CBC with Differential/Platelet  Result Value Ref Range   WBC 6.1 3.4 - 10.8 x10E3/uL   RBC 4.60 3.77 - 5.28 x10E6/uL   Hemoglobin 13.0 11.1 - 15.9 g/dL   Hematocrit 39.1 34.0 - 46.6 %   MCV 85 79 -  97 fL   MCH 28.3 26.6 - 33.0 pg   MCHC 33.2 31.5 - 35.7 g/dL   RDW 14.3 12.3 - 15.4 %   Platelets 330 150 - 379 x10E3/uL   Neutrophils 59 Not Estab. %   Lymphs 34 Not Estab. %   Monocytes 6 Not Estab. %   Eos 1 Not Estab. %   Basos 0 Not Estab. %   Neutrophils Absolute 3.6 1.4 - 7.0 x10E3/uL   Lymphocytes Absolute 2.1 0.7 - 3.1 x10E3/uL   Monocytes Absolute 0.4 0.1 - 0.9 x10E3/uL   EOS (ABSOLUTE) 0.1 0.0 - 0.4 x10E3/uL   Basophils Absolute 0.0 0.0 - 0.2 x10E3/uL   Immature Granulocytes 0 Not Estab. %   Immature Grans (Abs) 0.0 0.0 - 0.1 x10E3/uL  Comprehensive metabolic panel  Result Value Ref Range   Glucose 99 65 - 99 mg/dL    BUN 8 6 - 24 mg/dL   Creatinine, Ser 0.77 0.57 - 1.00 mg/dL   GFR calc non Af Amer 94 >59 mL/min/1.73   GFR calc Af Amer 108 >59 mL/min/1.73   BUN/Creatinine Ratio 10 9 - 23   Sodium 140 134 - 144 mmol/L   Potassium 4.3 3.5 - 5.2 mmol/L   Chloride 101 96 - 106 mmol/L   CO2 24 20 - 29 mmol/L   Calcium 9.5 8.7 - 10.2 mg/dL   Total Protein 7.0 6.0 - 8.5 g/dL   Albumin 4.5 3.5 - 5.5 g/dL   Globulin, Total 2.5 1.5 - 4.5 g/dL   Albumin/Globulin Ratio 1.8 1.2 - 2.2   Bilirubin Total 0.3 0.0 - 1.2 mg/dL   Alkaline Phosphatase 48 39 - 117 IU/L   AST 18 0 - 40 IU/L   ALT 18 0 - 32 IU/L  Lipid Panel w/o Chol/HDL Ratio  Result Value Ref Range   Cholesterol, Total 167 100 - 199 mg/dL   Triglycerides 97 0 - 149 mg/dL   HDL 58 >39 mg/dL   VLDL Cholesterol Cal 19 5 - 40 mg/dL   LDL Calculated 90 0 - 99 mg/dL  Microalbumin, Urine Waived  Result Value Ref Range   Microalb, Ur Waived 30 (H) 0 - 19 mg/L   Creatinine, Urine Waived 300 10 - 300 mg/dL   Microalb/Creat Ratio <30 <30 mg/g  TSH  Result Value Ref Range   TSH 1.530 0.450 - 4.500 uIU/mL  UA/M w/rflx Culture, Routine  Result Value Ref Range   Specific Gravity, UA 1.025 1.005 - 1.030   pH, UA 5.5 5.0 - 7.5   Color, UA Yellow Yellow   Appearance Ur Hazy (A) Clear   Leukocytes, UA Negative Negative   Protein, UA Negative Negative/Trace   Glucose, UA Negative Negative   Ketones, UA Negative Negative   RBC, UA Trace (A) Negative   Bilirubin, UA Negative Negative   Urobilinogen, Ur 0.2 0.2 - 1.0 mg/dL   Nitrite, UA Negative Negative   Microscopic Examination See below:   VITAMIN D 25 Hydroxy (Vit-D Deficiency, Fractures)  Result Value Ref Range   Vit D, 25-Hydroxy 36.9 30.0 - 100.0 ng/mL      Assessment & Plan:   Problem List Items Addressed This Visit    None    Visit Diagnoses    Allergy to insect bites    -  Primary   Will treat with triamcinalone shot and prednisone, zyrtec, zantac and benadryl. Call if not getting  better or getting worse.    Relevant Medications   triamcinolone acetonide (KENALOG-40) injection  40 mg (Start on 09/17/2017  4:30 PM)   Spasm of diaphragm       Will treat with flexeril. Call with any concerns.        Follow up plan: Return if symptoms worsen or fail to improve.

## 2017-09-18 ENCOUNTER — Encounter: Payer: Self-pay | Admitting: Family Medicine

## 2017-09-21 ENCOUNTER — Encounter: Payer: Self-pay | Admitting: Family Medicine

## 2017-10-05 ENCOUNTER — Encounter: Payer: Self-pay | Admitting: Family Medicine

## 2017-12-18 ENCOUNTER — Ambulatory Visit: Payer: BLUE CROSS/BLUE SHIELD | Admitting: Family Medicine

## 2017-12-24 ENCOUNTER — Encounter: Payer: Self-pay | Admitting: Family Medicine

## 2017-12-24 ENCOUNTER — Ambulatory Visit (INDEPENDENT_AMBULATORY_CARE_PROVIDER_SITE_OTHER): Payer: BLUE CROSS/BLUE SHIELD | Admitting: Family Medicine

## 2017-12-24 VITALS — BP 113/79 | HR 73 | Temp 97.7°F | Wt 149.1 lb

## 2017-12-24 DIAGNOSIS — E7800 Pure hypercholesterolemia, unspecified: Secondary | ICD-10-CM | POA: Diagnosis not present

## 2017-12-24 DIAGNOSIS — E559 Vitamin D deficiency, unspecified: Secondary | ICD-10-CM

## 2017-12-24 DIAGNOSIS — K219 Gastro-esophageal reflux disease without esophagitis: Secondary | ICD-10-CM | POA: Diagnosis not present

## 2017-12-24 DIAGNOSIS — R7301 Impaired fasting glucose: Secondary | ICD-10-CM

## 2017-12-24 DIAGNOSIS — D649 Anemia, unspecified: Secondary | ICD-10-CM

## 2017-12-24 DIAGNOSIS — M549 Dorsalgia, unspecified: Secondary | ICD-10-CM

## 2017-12-24 LAB — BAYER DCA HB A1C WAIVED: HB A1C: 5.8 % (ref <7.0)

## 2017-12-24 MED ORDER — PANTOPRAZOLE SODIUM 40 MG PO TBEC: 40.0000 mg | DELAYED_RELEASE_TABLET | Freq: Two times a day (BID) | ORAL | 1 refills | Status: DC

## 2017-12-24 MED ORDER — PRAVASTATIN SODIUM 20 MG PO TABS: ORAL_TABLET | ORAL | 1 refills | Status: DC

## 2017-12-24 MED ORDER — CYCLOBENZAPRINE HCL 10 MG PO TABS: 10.0000 mg | ORAL_TABLET | Freq: Every day | ORAL | 3 refills | Status: DC

## 2017-12-24 MED ORDER — NAPROXEN 500 MG PO TABS: 500.0000 mg | ORAL_TABLET | Freq: Two times a day (BID) | ORAL | 3 refills | Status: DC

## 2017-12-24 NOTE — Assessment & Plan Note (Signed)
Rechecking levels today. Call with any concerns. Continue to monitor. Refills given today.

## 2017-12-24 NOTE — Assessment & Plan Note (Signed)
Under good control on current regimen. Call with any concerns. Continue to monitor.

## 2017-12-24 NOTE — Progress Notes (Signed)
BP 113/79 (BP Location: Left Arm, Patient Position: Sitting, Cuff Size: Normal)   Pulse 73   Temp 97.7 F (36.5 C)   Wt 149 lb 2 oz (67.6 kg)   SpO2 100%   BMI 26.42 kg/m    Subjective:    Patient ID: Judy Munoz, female    DOB: [birth year], 47 y.o.   MRN: [id number]  HPI: Judy Munoz is a 47 y.o. female  Chief Complaint  Patient presents with  . Hyperlipidemia  . Back Pain    left arm and left shoulder blade   BACK PAIN Duration: 2 weeks Mechanism of injury: lifting Location: L upper back and shoulder blade Onset: sudden Severity: moderate Quality: sharp and aching Frequency: intermittent Radiation: into L arm Aggravating factors: lifting and moving it around Alleviating factors: rest, heat, NSAIDs and muscle relaxer Status: better Treatments attempted: rest, ice, heat, APAP, ibuprofen and aleve  Relief with NSAIDs?: mild Nighttime pain:  no Paresthesias / decreased sensation:  yes Bowel / bladder incontinence:  no Fevers:  no Dysuria / urinary frequency:  no  HYPERLIPIDEMIA Hyperlipidemia status: excellent compliance Satisfied with current treatment?  yes Side effects:  no Medication compliance: excellent compliance Past cholesterol meds: pravastatin Supplements: none Aspirin:  no The 10-year ASCVD risk score [name], et al., 2013) is: 0.5%   Values used to calculate the score:     Age: 64 years     Sex: Female     Is Non-Hispanic African American: No     Diabetic: No     Tobacco smoker: No     Systolic Blood Pressure: 517 mmHg     Is BP treated: No     HDL Cholesterol: 58 mg/dL     Total Cholesterol: 167 mg/dL Chest pain:  no Coronary artery disease:  no  Impaired Fasting Glucose HbA1C:  Lab Results  Component Value Date   HGBA1C 5.7 12/12/2016   Duration of elevated blood sugar:  Polydipsia: no Polyuria: no Weight change: no Visual disturbance: no Glucose Monitoring: no Diabetic Education: Completed Family history of  diabetes: yes  GERD GERD control status: controlled  Satisfied with current treatment? yes Heartburn frequency: rarely Medication side effects: no  Medication compliance: excellent Dysphagia: no Odynophagia:  no Hematemesis: no Blood in stool: no EGD: no  ANEMIA Anemia status: controlled Compliance with treatment: excellent compliance Iron supplementation side effects: no Severity of anemia: mild Fatigue: no Decreased exercise tolerance: no  Dyspnea on exertion: no Palpitations: no Bleeding: no Pica: no  Relevant past medical, surgical, family and social history reviewed and updated as indicated. Interim medical history since our last visit reviewed. Allergies and medications reviewed and updated.  Review of Systems  Constitutional: Negative.   Respiratory: Negative.   Cardiovascular: Negative.   Musculoskeletal: Positive for back pain. Negative for arthralgias, gait problem, joint swelling, myalgias, neck pain and neck stiffness.  Skin: Negative.   Psychiatric/Behavioral: Negative.     Per HPI unless specifically indicated above     Objective:    BP 113/79 (BP Location: Left Arm, Patient Position: Sitting, Cuff Size: Normal)   Pulse 73   Temp 97.7 F (36.5 C)   Wt 149 lb 2 oz (67.6 kg)   SpO2 100%   BMI 26.42 kg/m   Wt Readings from Last 3 Encounters:  12/24/17 149 lb 2 oz (67.6 kg)  09/17/17 148 lb 2 oz (67.2 kg)  07/22/17 147 lb (66.7 kg)    Physical Exam  Constitutional: She  is oriented to person, place, and time. She appears well-developed and well-nourished. No distress.  HENT:  Head: Normocephalic and atraumatic.  Right Ear: Hearing normal.  Left Ear: Hearing normal.  Nose: Nose normal.  Eyes: Conjunctivae and lids are normal. Right eye exhibits no discharge. Left eye exhibits no discharge. No scleral icterus.  Cardiovascular: Normal rate, regular rhythm, normal heart sounds and intact distal pulses. Exam reveals no gallop and no friction rub.    No murmur heard. Pulmonary/Chest: Effort normal and breath sounds normal. No stridor. No respiratory distress. She has no wheezes. She has no rales. She exhibits no tenderness.  Musculoskeletal: Normal range of motion.  Lat spasm on the L   Neurological: She is alert and oriented to person, place, and time.  Skin: Skin is warm, dry and intact. Capillary refill takes less than 2 seconds. No rash noted. She is not diaphoretic. No erythema. No pallor.  Psychiatric: She has a normal mood and affect. Her speech is normal and behavior is normal. Judgment and thought content normal. Cognition and memory are normal.  Nursing note and vitals reviewed.   Results for orders placed or performed in visit on 06/20/17  Microscopic Examination  Result Value Ref Range   WBC, UA 0-5 0 - 5 /hpf   RBC, UA 0-2 0 - 2 /hpf   Epithelial Cells (non renal) 0-10 0 - 10 /hpf   Bacteria, UA Few None seen/Few  Bayer DCA Hb A1c Waived  Result Value Ref Range   HB A1C (BAYER DCA - WAIVED) 5.4 <7.0 %  CBC with Differential/Platelet  Result Value Ref Range   WBC 6.1 3.4 - 10.8 x10E3/uL   RBC 4.60 3.77 - 5.28 x10E6/uL   Hemoglobin 13.0 11.1 - 15.9 g/dL   Hematocrit 39.1 34.0 - 46.6 %   MCV 85 79 - 97 fL   MCH 28.3 26.6 - 33.0 pg   MCHC 33.2 31.5 - 35.7 g/dL   RDW 14.3 12.3 - 15.4 %   Platelets 330 150 - 379 x10E3/uL   Neutrophils 59 Not Estab. %   Lymphs 34 Not Estab. %   Monocytes 6 Not Estab. %   Eos 1 Not Estab. %   Basos 0 Not Estab. %   Neutrophils Absolute 3.6 1.4 - 7.0 x10E3/uL   Lymphocytes Absolute 2.1 0.7 - 3.1 x10E3/uL   Monocytes Absolute 0.4 0.1 - 0.9 x10E3/uL   EOS (ABSOLUTE) 0.1 0.0 - 0.4 x10E3/uL   Basophils Absolute 0.0 0.0 - 0.2 x10E3/uL   Immature Granulocytes 0 Not Estab. %   Immature Grans (Abs) 0.0 0.0 - 0.1 x10E3/uL  Comprehensive metabolic panel  Result Value Ref Range   Glucose 99 65 - 99 mg/dL   BUN 8 6 - 24 mg/dL   Creatinine, Ser 0.77 0.57 - 1.00 mg/dL   GFR calc non Af  Amer 94 >59 mL/min/1.73   GFR calc Af Amer 108 >59 mL/min/1.73   BUN/Creatinine Ratio 10 9 - 23   Sodium 140 134 - 144 mmol/L   Potassium 4.3 3.5 - 5.2 mmol/L   Chloride 101 96 - 106 mmol/L   CO2 24 20 - 29 mmol/L   Calcium 9.5 8.7 - 10.2 mg/dL   Total Protein 7.0 6.0 - 8.5 g/dL   Albumin 4.5 3.5 - 5.5 g/dL   Globulin, Total 2.5 1.5 - 4.5 g/dL   Albumin/Globulin Ratio 1.8 1.2 - 2.2   Bilirubin Total 0.3 0.0 - 1.2 mg/dL   Alkaline Phosphatase 48 39 - 117  IU/L   AST 18 0 - 40 IU/L   ALT 18 0 - 32 IU/L  Lipid Panel w/o Chol/HDL Ratio  Result Value Ref Range   Cholesterol, Total 167 100 - 199 mg/dL   Triglycerides 97 0 - 149 mg/dL   HDL 58 >39 mg/dL   VLDL Cholesterol Cal 19 5 - 40 mg/dL   LDL Calculated 90 0 - 99 mg/dL  Microalbumin, Urine Waived  Result Value Ref Range   Microalb, Ur Waived 30 (H) 0 - 19 mg/L   Creatinine, Urine Waived 300 10 - 300 mg/dL   Microalb/Creat Ratio <30 <30 mg/g  TSH  Result Value Ref Range   TSH 1.530 0.450 - 4.500 uIU/mL  UA/M w/rflx Culture, Routine  Result Value Ref Range   Specific Gravity, UA 1.025 1.005 - 1.030   pH, UA 5.5 5.0 - 7.5   Color, UA Yellow Yellow   Appearance Ur Hazy (A) Clear   Leukocytes, UA Negative Negative   Protein, UA Negative Negative/Trace   Glucose, UA Negative Negative   Ketones, UA Negative Negative   RBC, UA Trace (A) Negative   Bilirubin, UA Negative Negative   Urobilinogen, Ur 0.2 0.2 - 1.0 mg/dL   Nitrite, UA Negative Negative   Microscopic Examination See below:   VITAMIN D 25 Hydroxy (Vit-D Deficiency, Fractures)  Result Value Ref Range   Vit D, 25-Hydroxy 36.9 30.0 - 100.0 ng/mL      Assessment & Plan:   Problem List Items Addressed This Visit      Digestive   GERD (gastroesophageal reflux disease)    Rechecking levels today. Call with any concerns. Continue to monitor. Refills given today.       Relevant Medications   pantoprazole (PROTONIX) 40 MG tablet   Other Relevant Orders   CBC with  Differential/Platelet     Endocrine   IFG (impaired fasting glucose)    Under good control on current regimen. Call with any concerns. Continue to monitor.      Relevant Orders   Bayer DCA Hb A1c Waived   Comprehensive metabolic panel     Other   High cholesterol - Primary    Rechecking levels today. Call with any concerns. Continue to monitor. Refills given today.       Relevant Medications   pravastatin (PRAVACHOL) 20 MG tablet   Other Relevant Orders   Comprehensive metabolic panel   Lipid Panel w/o Chol/HDL Ratio   Vitamin D deficiency    Rechecking levels today. Call with any concerns. Continue to monitor.       Relevant Orders   VITAMIN D 25 Hydroxy (Vit-D Deficiency, Fractures)   Anemia    Rechecking levels today. Call with any concerns. Continue to monitor. Refills given today.       Relevant Orders   CBC with Differential/Platelet    Other Visit Diagnoses    Upper back pain       Seems to be due to lat spasm. Will treat with stretches, flexeril and naproxen. Call if not getting better or getting worse.    Relevant Medications   naproxen (NAPROSYN) 500 MG tablet   cyclobenzaprine (FLEXERIL) 10 MG tablet       Follow up plan: Return in about 6 months (around 06/26/2018) for Physical.

## 2017-12-24 NOTE — Assessment & Plan Note (Signed)
Rechecking levels today. Call with any concerns. Continue to monitor.

## 2017-12-25 LAB — COMPREHENSIVE METABOLIC PANEL
ALBUMIN: 4.4 g/dL (ref 3.5–5.5)
ALT: 13 IU/L (ref 0–32)
AST: 14 IU/L (ref 0–40)
Albumin/Globulin Ratio: 1.9 (ref 1.2–2.2)
Alkaline Phosphatase: 47 IU/L (ref 39–117)
BUN / CREAT RATIO: 16 (ref 9–23)
BUN: 13 mg/dL (ref 6–24)
Bilirubin Total: 0.4 mg/dL (ref 0.0–1.2)
CALCIUM: 9.4 mg/dL (ref 8.7–10.2)
CO2: 21 mmol/L (ref 20–29)
Chloride: 103 mmol/L (ref 96–106)
Creatinine, Ser: 0.8 mg/dL (ref 0.57–1.00)
GFR calc non Af Amer: 89 mL/min/{1.73_m2} (ref >59)
GFR, EST AFRICAN AMERICAN: 102 mL/min/{1.73_m2} (ref >59)
GLUCOSE: 96 mg/dL (ref 65–99)
Globulin, Total: 2.3 g/dL (ref 1.5–4.5)
Potassium: 4.3 mmol/L (ref 3.5–5.2)
Sodium: 141 mmol/L (ref 134–144)
Total Protein: 6.7 g/dL (ref 6.0–8.5)

## 2017-12-25 LAB — LIPID PANEL W/O CHOL/HDL RATIO
Cholesterol, Total: 171 mg/dL (ref 100–199)
HDL: 66 mg/dL (ref >39)
LDL CALC: 91 mg/dL (ref 0–99)
Triglycerides: 68 mg/dL (ref 0–149)
VLDL CHOLESTEROL CAL: 14 mg/dL (ref 5–40)

## 2017-12-25 LAB — CBC WITH DIFFERENTIAL/PLATELET
BASOS ABS: 0 10*3/uL (ref 0.0–0.2)
Basos: 1 %
EOS (ABSOLUTE): 0 10*3/uL (ref 0.0–0.4)
Eos: 0 %
HEMOGLOBIN: 13.6 g/dL (ref 11.1–15.9)
Hematocrit: 40.8 % (ref 34.0–46.6)
IMMATURE GRANS (ABS): 0 10*3/uL (ref 0.0–0.1)
IMMATURE GRANULOCYTES: 0 %
LYMPHS: 37 %
Lymphocytes Absolute: 2.4 10*3/uL (ref 0.7–3.1)
MCH: 28.8 pg (ref 26.6–33.0)
MCHC: 33.3 g/dL (ref 31.5–35.7)
MCV: 86 fL (ref 79–97)
Monocytes Absolute: 0.4 10*3/uL (ref 0.1–0.9)
Monocytes: 5 %
NEUTROS ABS: 3.8 10*3/uL (ref 1.4–7.0)
NEUTROS PCT: 57 %
PLATELETS: 293 10*3/uL (ref 150–450)
RBC: 4.73 x10E6/uL (ref 3.77–5.28)
RDW: 14.1 % (ref 12.3–15.4)
WBC: 6.6 10*3/uL (ref 3.4–10.8)

## 2017-12-25 LAB — VITAMIN D 25 HYDROXY (VIT D DEFICIENCY, FRACTURES): Vit D, 25-Hydroxy: 40 ng/mL (ref 30.0–100.0)

## 2018-02-11 ENCOUNTER — Ambulatory Visit (INDEPENDENT_AMBULATORY_CARE_PROVIDER_SITE_OTHER): Payer: BLUE CROSS/BLUE SHIELD

## 2018-02-11 DIAGNOSIS — Z23 Encounter for immunization: Secondary | ICD-10-CM | POA: Diagnosis not present

## 2018-03-19 ENCOUNTER — Other Ambulatory Visit: Payer: Self-pay | Admitting: Obstetrics and Gynecology

## 2018-03-19 ENCOUNTER — Other Ambulatory Visit: Payer: Self-pay | Admitting: Family Medicine

## 2018-03-19 DIAGNOSIS — Z1231 Encounter for screening mammogram for malignant neoplasm of breast: Secondary | ICD-10-CM

## 2018-03-20 ENCOUNTER — Encounter: Payer: BLUE CROSS/BLUE SHIELD | Admitting: Obstetrics and Gynecology

## 2018-03-27 ENCOUNTER — Ambulatory Visit (INDEPENDENT_AMBULATORY_CARE_PROVIDER_SITE_OTHER): Payer: BLUE CROSS/BLUE SHIELD | Admitting: Obstetrics and Gynecology

## 2018-03-27 ENCOUNTER — Encounter: Payer: Self-pay | Admitting: Obstetrics and Gynecology

## 2018-03-27 VITALS — BP 124/79 | HR 71 | Ht 63.0 in | Wt 150.3 lb

## 2018-03-27 DIAGNOSIS — Z01419 Encounter for gynecological examination (general) (routine) without abnormal findings: Secondary | ICD-10-CM

## 2018-03-27 DIAGNOSIS — D219 Benign neoplasm of connective and other soft tissue, unspecified: Secondary | ICD-10-CM | POA: Diagnosis not present

## 2018-03-27 DIAGNOSIS — R5383 Other fatigue: Secondary | ICD-10-CM | POA: Diagnosis not present

## 2018-03-27 DIAGNOSIS — N924 Excessive bleeding in the premenopausal period: Secondary | ICD-10-CM

## 2018-03-27 NOTE — Progress Notes (Signed)
GYNECOLOGY ANNUAL PHYSICAL EXAM PROGRESS NOTE  Subjective:    Judy Munoz is a 47 y.o. G3P1001 married female who presents for an annual exam.  The patient is sexually active.  The patient wears seatbelts: yes. The patient participates in regular exercise: no. Has the patient ever been transfused or tattooed?: no. The patient reports that there is not domestic violence in her life.    The patient has the following complaints today:  1.  Notes periods have started to get heavier for the past 3 months. Cycles may come every 19-25 days., lasting 4-5 days.  Passing large clots.  Reports 2 cycles in May and June.  Is now feeling more fatigued and short of breath this month. Also having hot flushes.    Gynecologic History Menarche age: 74 Patient's last menstrual period was 03/18/2018. Contraception: none History of STI's: Denies Last Pap: 03/2017. Results were: normal.  Denies h/o abnormal pap smears. Last mammogram: 03/2017. Results were: Normal. BIRADS 1.    OB History  Gravida Para Term Preterm AB Living  1 1 1  0 0 1  SAB TAB Ectopic Multiple Live Births  0 0 0 0 1    # Outcome Date GA Lbr Len/2nd Weight Sex Delivery Anes PTL Lv  1 Term 2014 [redacted]w[redacted]d 5 lb 12 oz (2.608 kg) F Vag-Spont  N LIV    Past Medical History:  Diagnosis Date  . Anemia    H/O WITH PREGNANCY  . Biliary dyskinesia   . Elevated glucose   . GERD (gastroesophageal reflux disease)   . Headache    MIGRAINES RARE  . Heart murmur    WAS TOLD THIS ONCE YEARS AGO-HAS NEVER BEEN TOLD SINCE  . High cholesterol   . History of uterine fibroid   . IFG (impaired fasting glucose)   . PONV (postoperative nausea and vomiting)    HAPPENED WITH HER 1ST EGD- HER 2ND EGD SHE WAS GIVEN ANTI-EMETIC IV AND HAD NO N/V  . Vitamin D deficiency     Past Surgical History:  Procedure Laterality Date  . CHOLECYSTECTOMY N/A 07/18/2016   Procedure: LAPAROSCOPIC CHOLECYSTECTOMY;  Surgeon: DJules Husbands MD;  Location: [facility];  Service: General;  Laterality: N/A;  . CHOLECYSTECTOMY, LAPAROSCOPIC  07/18/2016  . ESOPHAGOGASTRODUODENOSCOPY    . ESOPHAGOGASTRODUODENOSCOPY (EGD) WITH PROPOFOL N/A 07/26/2015   Procedure: ESOPHAGOGASTRODUODENOSCOPY (EGD) WITH PROPOFOL;  Surgeon: DLucilla Lame MD;  Location: [facility]  Service: Endoscopy;  Laterality: N/A;    Family History  Problem Relation Age of Onset  . Diabetes Father   . Heart disease Father   . Hypertension Father   . Hyperlipidemia Father   . Thyroid disease Mother   . Cancer Maternal Grandmother        uterine  . Breast cancer Maternal Grandmother 560 . Thyroid disease Maternal Grandmother   . Lung cancer Maternal Uncle     Social History   Socioeconomic History  . Marital status: Married    Spouse name: Not on file  . Number of children: Not on file  . Years of education: Not on file  . Highest education level: Not on file  Occupational History  . Not on file  Social Needs  . Financial resource strain: Not on file  . Food insecurity:    Worry: Not on file    Inability: Not on file  . Transportation needs:    Medical: Not on file    Non-medical: Not on file  Tobacco Use  . Smoking status: Never Smoker  . Smokeless tobacco: Never Used  Substance and Sexual Activity  . Alcohol use: No  . Drug use: No  . Sexual activity: Yes    Birth control/protection: None  Lifestyle  . Physical activity:    Days per week: 7 days    Minutes per session: 60 min  . Stress: Only a little  Relationships  . Social connections:    Talks on phone: Patient refused    Gets together: Patient refused    Attends religious service: Patient refused    Active member of club or organization: Patient refused    Attends meetings of clubs or organizations: Patient refused    Relationship status: Patient refused  . Intimate partner violence:    Fear of current or ex partner: Patient refused    Emotionally abused: Patient refused    Physically  abused: Patient refused    Forced sexual activity: Patient refused  Other Topics Concern  . Not on file  Social History Narrative  . Not on file    Current Outpatient Medications on File Prior to Visit  Medication Sig Dispense Refill  . cetirizine (ZYRTEC) 10 MG tablet Take 10 mg by mouth daily.    . Cholecalciferol (VITAMIN D-400 PO) Take by mouth.    . cyclobenzaprine (FLEXERIL) 10 MG tablet Take 1 tablet (10 mg total) by mouth at bedtime. (Patient taking differently: Take 10 mg by mouth 3 (three) times daily as needed. ) 30 tablet 3  . fluticasone (FLONASE) 50 MCG/ACT nasal spray Place 2 sprays into both nostrils 2 (two) times daily. 16 g 6  . naproxen (NAPROSYN) 500 MG tablet Take 1 tablet (500 mg total) by mouth 2 (two) times daily with a meal. 60 tablet 3  . pantoprazole (PROTONIX) 40 MG tablet Take 1 tablet (40 mg total) by mouth 2 (two) times daily. 180 tablet 1  . pravastatin (PRAVACHOL) 20 MG tablet TAKE 1 TABLET BY MOUTH EVERYDAY AT BEDTIME 90 tablet 1   No current facility-administered medications on file prior to visit.     Allergies  Allergen Reactions  . Sulfa Antibiotics Hives    Review of Systems Constitutional: negative for chills, fevers and sweats, fatigue Eyes: negative for irritation, redness and visual disturbance Ears, nose, mouth, throat, and face: negative for hearing loss, nasal congestion, snoring and tinnitus Respiratory: negative for asthma, cough, sputum Cardiovascular: negative for chest pain, dyspnea, exertional chest pressure/discomfort, irregular heart beat, palpitations and syncope Gastrointestinal: negative for abdominal pain, change in bowel habits, nausea and vomiting Genitourinary: positive for changes in menstrual periods (see HPI).  Negative for genital lesions, sexual problems and vaginal discharge, dysuria and urinary incontinence Integument/breast: negative for breast lump, breast tenderness and nipple discharge Hematologic/lymphatic:  negative for bleeding and easy bruising Musculoskeletal:  Negative for muscle weakness or back pain Neurological: negative for dizziness, headaches, vertigo and weakness Endocrine: negative for diabetic symptoms including polydipsia, polyuria and skin dryness Allergic/Immunologic: negative for hay fever and urticaria         Objective:  Blood pressure 124/79, pulse 71, height 5' 3"  (1.6 m), weight 150 lb 4.8 oz (68.2 kg), last menstrual period 03/18/2018. Body mass index is 26.62 kg/m.  General Appearance:    Alert, cooperative, no distress, appears stated age, overweight  Head:    Normocephalic, without obvious abnormality, atraumatic  Eyes:    PERRL, conjunctiva/corneas clear, EOM's intact, both eyes  Ears:    Normal external ear canals, both ears  Nose:   Nares normal, septum midline, mucosa normal, no drainage or sinus tenderness  Throat:   Lips, mucosa, and tongue normal; teeth and gums normal  Neck:   Supple, symmetrical, trachea midline, no adenopathy; thyroid: no enlargement/tenderness/nodules; no carotid bruit or JVD  Back:     Symmetric, no curvature, ROM normal, no CVA tenderness  Lungs:     Clear to auscultation bilaterally, respirations unlabored  Chest Wall:    No tenderness or deformity   Heart:    Regular rate and rhythm, S1 and S2 normal, no murmur, rub or gallop  Breast Exam:    No tenderness, masses, or nipple abnormality  Abdomen:     Soft, non-tender, bowel sounds active all four quadrants, no masses, no organomegaly.    Genitalia:    Pelvic:external genitalia normal, vagina without lesions, discharge, or tenderness, rectovaginal septum  normal. Cervix normal in appearance, no cervical motion tenderness, no adnexal masses or tenderness.  Uterus 10-12 week size, shape, mobile, regular contours, nontender.  Rectal:    Normal external sphincter.  No hemorrhoids appreciated. Internal exam not done.   Extremities:   Extremities normal, atraumatic, no cyanosis or edema    Pulses:   2+ and symmetric all extremities  Skin:   Skin color, texture, turgor normal, no rashes or lesions  Lymph nodes:   Cervical, supraclavicular, and axillary nodes normal  Neurologic:   CNII-XII intact, normal strength, sensation and reflexes throughout   .  Labs:  Lab Results  Component Value Date   WBC 6.6 12/24/2017   HGB 13.6 12/24/2017   HCT 40.8 12/24/2017   MCV 86 12/24/2017   PLT 293 12/24/2017    Lab Results  Component Value Date   CREATININE 0.80 12/24/2017   BUN 13 12/24/2017   NA 141 12/24/2017   K 4.3 12/24/2017   CL 103 12/24/2017   CO2 21 12/24/2017    Lab Results  Component Value Date   ALT 13 12/24/2017   AST 14 12/24/2017   ALKPHOS 47 12/24/2017   BILITOT 0.4 12/24/2017    Lab Results  Component Value Date   TSH 1.530 06/20/2017    Lab Results  Component Value Date   CHOL 171 12/24/2017   HDL 66 12/24/2017   LDLCALC 91 12/24/2017   TRIG 68 12/24/2017   CHOLHDL 3.9 03/02/2015    Lab Results  Component Value Date   HGBA1C 5.7 12/12/2016      Assessment:   Healthy female exam.  Perimenopausal abnormal bleeding Fibroid uterus Fatigue  Plan:    - Blood tests: Up to date.  Patient notes periods have gotten heavier since last CBC drawn. Will check an H/H and iron studies. Advised to begin an iron supplement.  - Breast self exam technique reviewed and patient encouraged to perform self-exam monthly. - Contraception: none.  - Discussed healthy lifestyle modifications. - Mammogram ordered.   - Discussed perimenopausal symptoms (mood changes, shortening of cycles with heavier bleeding).  Patient also with h/o small fibroids, previously asymptomatic. Uterine size stil wnl. .  Discussed again treatment options with patient, including menopausal HRT dose range, OCPs, Depo Provera, Mirena IUD; or surgical management with endometrial ablation or hysterectomy.  Patient was prescribed HRT last year, but notes that she never started it.  Notes she would like to think of her options. Will also order pelvic ultrasound to f/u fibroids as it has been 2 years since last scan. - Flu vaccine up to date.  - RTC in 1 year,  or sooner if symptoms persist. Will notify patient of all results by phone/Mychart, and discuss if sooner follow up is needed for management.    Rubie Maid, MD [facility]   Rubie Maid, MD [facility]

## 2018-03-27 NOTE — Progress Notes (Signed)
Pt is present today for her annual exam.  Pt stated that she do self-breast exams monthly.  Pt stated that she is doing well no complaints. Pt had flu vaccin 02/11/18.

## 2018-03-27 NOTE — Patient Instructions (Signed)
Health Maintenance, Female Adopting a healthy lifestyle and getting preventive care can go a long way to promote health and wellness. Talk with your health care provider about what schedule of regular examinations is right for you. This is a good chance for you to check in with your provider about disease prevention and staying healthy. In between checkups, there are plenty of things you can do on your own. Experts have done a lot of research about which lifestyle changes and preventive measures are most likely to keep you healthy. Ask your health care provider for more information. Weight and diet Eat a healthy diet  Be sure to include plenty of vegetables, fruits, low-fat dairy products, and lean protein.  Do not eat a lot of foods high in solid fats, added sugars, or salt.  Get regular exercise. This is one of the most important things you can do for your health. ? Most adults should exercise for at least 150 minutes each week. The exercise should increase your heart rate and make you sweat (moderate-intensity exercise). ? Most adults should also do strengthening exercises at least twice a week. This is in addition to the moderate-intensity exercise.  Maintain a healthy weight  Body mass index (BMI) is a measurement that can be used to identify possible weight problems. It estimates body fat based on height and weight. Your health care provider can help determine your BMI and help you achieve or maintain a healthy weight.  For females 20 years of age and older: ? A BMI below 18.5 is considered underweight. ? A BMI of 18.5 to 24.9 is normal. ? A BMI of 25 to 29.9 is considered overweight. ? A BMI of 30 and above is considered obese.  Watch levels of cholesterol and blood lipids  You should start having your blood tested for lipids and cholesterol at 46 years of age, then have this test every 5 years.  You may need to have your cholesterol levels checked more often if: ? Your lipid or  cholesterol levels are high. ? You are older than 46 years of age. ? You are at high risk for heart disease.  Cancer screening Lung Cancer  Lung cancer screening is recommended for adults 55-80 years old who are at high risk for lung cancer because of a history of smoking.  A yearly low-dose CT scan of the lungs is recommended for people who: ? Currently smoke. ? Have quit within the past 15 years. ? Have at least a 30-pack-year history of smoking. A pack year is smoking an average of one pack of cigarettes a day for 1 year.  Yearly screening should continue until it has been 15 years since you quit.  Yearly screening should stop if you develop a health problem that would prevent you from having lung cancer treatment.  Breast Cancer  Practice breast self-awareness. This means understanding how your breasts normally appear and feel.  It also means doing regular breast self-exams. Let your health care provider know about any changes, no matter how small.  If you are in your 20s or 30s, you should have a clinical breast exam (CBE) by a health care provider every 1-3 years as part of a regular health exam.  If you are 40 or older, have a CBE every year. Also consider having a breast X-ray (mammogram) every year.  If you have a family history of breast cancer, talk to your health care provider about genetic screening.  If you are at high risk   for breast cancer, talk to your health care provider about having an MRI and a mammogram every year.  Breast cancer gene (BRCA) assessment is recommended for women who have family members with BRCA-related cancers. BRCA-related cancers include: ? Breast. ? Ovarian. ? Tubal. ? Peritoneal cancers.  Results of the assessment will determine the need for genetic counseling and BRCA1 and BRCA2 testing.  Cervical Cancer Your health care provider may recommend that you be screened regularly for cancer of the pelvic organs (ovaries, uterus, and  vagina). This screening involves a pelvic examination, including checking for microscopic changes to the surface of your cervix (Pap test). You may be encouraged to have this screening done every 3 years, beginning at age 22.  For women ages 56-65, health care providers may recommend pelvic exams and Pap testing every 3 years, or they may recommend the Pap and pelvic exam, combined with testing for human papilloma virus (HPV), every 5 years. Some types of HPV increase your risk of cervical cancer. Testing for HPV may also be done on women of any age with unclear Pap test results.  Other health care providers may not recommend any screening for nonpregnant women who are considered low risk for pelvic cancer and who do not have symptoms. Ask your health care provider if a screening pelvic exam is right for you.  If you have had past treatment for cervical cancer or a condition that could lead to cancer, you need Pap tests and screening for cancer for at least 20 years after your treatment. If Pap tests have been discontinued, your risk factors (such as having a new sexual partner) need to be reassessed to determine if screening should resume. Some women have medical problems that increase the chance of getting cervical cancer. In these cases, your health care provider may recommend more frequent screening and Pap tests.  Colorectal Cancer  This type of cancer can be detected and often prevented.  Routine colorectal cancer screening usually begins at 46 years of age and continues through 46 years of age.  Your health care provider may recommend screening at an earlier age if you have risk factors for colon cancer.  Your health care provider may also recommend using home test kits to check for hidden blood in the stool.  A small camera at the end of a tube can be used to examine your colon directly (sigmoidoscopy or colonoscopy). This is done to check for the earliest forms of colorectal  cancer.  Routine screening usually begins at age 33.  Direct examination of the colon should be repeated every 5-10 years through 46 years of age. However, you may need to be screened more often if early forms of precancerous polyps or small growths are found.  Skin Cancer  Check your skin from head to toe regularly.  Tell your health care provider about any new moles or changes in moles, especially if there is a change in a mole's shape or color.  Also tell your health care provider if you have a mole that is larger than the size of a pencil eraser.  Always use sunscreen. Apply sunscreen liberally and repeatedly throughout the day.  Protect yourself by wearing long sleeves, pants, a wide-brimmed hat, and sunglasses whenever you are outside.  Heart disease, diabetes, and high blood pressure  High blood pressure causes heart disease and increases the risk of stroke. High blood pressure is more likely to develop in: ? People who have blood pressure in the high end of  the normal range (130-139/85-89 mm Hg). ? People who are overweight or obese. ? People who are African American.  If you are 21-29 years of age, have your blood pressure checked every 3-5 years. If you are 3 years of age or older, have your blood pressure checked every year. You should have your blood pressure measured twice-once when you are at a hospital or clinic, and once when you are not at a hospital or clinic. Record the average of the two measurements. To check your blood pressure when you are not at a hospital or clinic, you can use: ? An automated blood pressure machine at a pharmacy. ? A home blood pressure monitor.  If you are between 17 years and 37 years old, ask your health care provider if you should take aspirin to prevent strokes.  Have regular diabetes screenings. This involves taking a blood sample to check your fasting blood sugar level. ? If you are at a normal weight and have a low risk for diabetes,  have this test once every three years after 46 years of age. ? If you are overweight and have a high risk for diabetes, consider being tested at a younger age or more often. Preventing infection Hepatitis B  If you have a higher risk for hepatitis B, you should be screened for this virus. You are considered at high risk for hepatitis B if: ? You were born in a country where hepatitis B is common. Ask your health care provider which countries are considered high risk. ? Your parents were born in a high-risk country, and you have not been immunized against hepatitis B (hepatitis B vaccine). ? You have HIV or AIDS. ? You use needles to inject street drugs. ? You live with someone who has hepatitis B. ? You have had sex with someone who has hepatitis B. ? You get hemodialysis treatment. ? You take certain medicines for conditions, including cancer, organ transplantation, and autoimmune conditions.  Hepatitis C  Blood testing is recommended for: ? Everyone born from [birth year] through 1964. ? Anyone with known risk factors for hepatitis C.  Sexually transmitted infections (STIs)  You should be screened for sexually transmitted infections (STIs) including gonorrhea and chlamydia if: ? You are sexually active and are younger than 46 years of age. ? You are older than 46 years of age and your health care provider tells you that you are at risk for this type of infection. ? Your sexual activity has changed since you were last screened and you are at an increased risk for chlamydia or gonorrhea. Ask your health care provider if you are at risk.  If you do not have HIV, but are at risk, it may be recommended that you take a prescription medicine daily to prevent HIV infection. This is called pre-exposure prophylaxis (PrEP). You are considered at risk if: ? You are sexually active and do not regularly use condoms or know the HIV status of your partner(s). ? You take drugs by injection. ? You are  sexually active with a partner who has HIV.  Talk with your health care provider about whether you are at high risk of being infected with HIV. If you choose to begin PrEP, you should first be tested for HIV. You should then be tested every 3 months for as long as you are taking PrEP. Pregnancy  If you are premenopausal and you may become pregnant, ask your health care provider about preconception counseling.  If you may become  pregnant, take 400 to 800 micrograms (mcg) of folic acid every day.  If you want to prevent pregnancy, talk to your health care provider about birth control (contraception). Osteoporosis and menopause  Osteoporosis is a disease in which the bones lose minerals and strength with aging. This can result in serious bone fractures. Your risk for osteoporosis can be identified using a bone density scan.  If you are 29 years of age or older, or if you are at risk for osteoporosis and fractures, ask your health care provider if you should be screened.  Ask your health care provider whether you should take a calcium or vitamin D supplement to lower your risk for osteoporosis.  Menopause may have certain physical symptoms and risks.  Hormone replacement therapy may reduce some of these symptoms and risks. Talk to your health care provider about whether hormone replacement therapy is right for you. Follow these instructions at home:  Schedule regular health, dental, and eye exams.  Stay current with your immunizations.  Do not use any tobacco products including cigarettes, chewing tobacco, or electronic cigarettes.  If you are pregnant, do not drink alcohol.  If you are breastfeeding, limit how much and how often you drink alcohol.  Limit alcohol intake to no more than 1 drink per day for nonpregnant women. One drink equals 12 ounces of beer, 5 ounces of wine, or 1 ounces of hard liquor.  Do not use street drugs.  Do not share needles.  Ask your health care  provider for help if you need support or information about quitting drugs.  Tell your health care provider if you often feel depressed.  Tell your health care provider if you have ever been abused or do not feel safe at home. This information is not intended to replace advice given to you by your health care provider. Make sure you discuss any questions you have with your health care provider. Document Released: 10/31/2010 Document Revised: 09/23/2015 Document Reviewed: 01/19/2015 Elsevier Interactive Patient Education  2018 [employer].    Menorrhagia Menorrhagia is a condition in which menstrual periods are heavy or last longer than normal. With menorrhagia, most periods a woman has may cause enough blood loss and cramping that she becomes unable to take part in her usual activities. What are the causes? Common causes of this condition include:  Noncancerous growths in the uterus (polyps or fibroids).  An imbalance of the estrogen and progesterone hormones.  One of the ovaries not releasing an egg during one or more months.  A problem with the thyroid gland (hypothyroid).  Side effects of having an intrauterine device (IUD).  Side effects of some medicines, such as anti-inflammatory medicines or blood thinners.  A bleeding disorder that stops the blood from clotting normally.  In some cases, the cause of this condition is not known. What are the signs or symptoms? Symptoms of this condition include:  Routinely having to change your pad or tampon every 1-2 hours because it is completely soaked.  Needing to use pads and tampons at the same time because of heavy bleeding.  Needing to wake up to change your pads or tampons during the night.  Passing blood clots larger than 1 inch (2.5 cm) in size.  Having bleeding that lasts for more than 7 days.  Having symptoms of low iron levels (anemia), such as tiredness, fatigue, or shortness of breath.  How is this  diagnosed? This condition may be diagnosed based on:  A physical exam.  Your  symptoms and menstrual history.  Tests, such as: ? Blood tests to check if you are pregnant or have hormonal changes, a bleeding or thyroid disorder, anemia, or other problems. ? Pap test to check for cancerous changes, infections, or inflammation. ? Endometrial biopsy. This test involves removing a tissue sample from the lining of the uterus (endometrium) to be examined under a microscope. ? Pelvic ultrasound. This test uses sound waves to create images of your uterus, ovaries, and vagina. The images can show if you have fibroids or other growths. ? Hysteroscopy. For this test, a small telescope is used to look inside your uterus.  How is this treated? Treatment may not be needed for this condition. If it is needed, the best treatment for you will depend on:  Whether you need to prevent pregnancy.  Your desire to have children in the future.  The cause and severity of your bleeding.  Your personal preference.  Medicines are the first step in treatment. You may be treated with:  Hormonal birth control methods. These treatments reduce bleeding during your menstrual period. They include: ? Birth control pills. ? Skin patch. ? Vaginal ring. ? Shots (injections) that you get every 3 months. ? Hormonal IUD (intrauterine device). ? Implants that go under the skin.  Medicines that thicken blood and slow bleeding.  Medicines that reduce swelling, such as ibuprofen.  Medicines that contain an artificial (synthetic) hormone called progestin.  Medicines that make the ovaries stop working for a short time.  Iron supplements to treat anemia.  If medicines do not work, surgery may be done. Surgical options may include:  Dilation and curettage (D&C). In this procedure, your health care provider opens (dilates) your cervix and then scrapes or suctions tissue from the endometrium to reduce menstrual  bleeding.  Operative hysteroscopy. In this procedure, a small tube with a light on the end (hysteroscope) is used to view your uterus and help remove polyps that may be causing heavy periods.  Endometrial ablation. This is when various techniques are used to permanently destroy your entire endometrium. After endometrial ablation, most women have little or no menstrual flow. This procedure reduces your ability to become pregnant.  Endometrial resection. In this procedure, an electrosurgical wire loop is used to remove the endometrium. This procedure reduces your ability to become pregnant.  Hysterectomy. This is surgical removal of the uterus. This is a permanent procedure that stops menstrual periods. Pregnancy is not possible after a hysterectomy.  Follow these instructions at home: Medicines  Take over-the-counter and prescription medicines exactly as told by your health care provider. This includes iron pills.  Do not change or switch medicines without asking your health care provider.  Do not take aspirin or medicines that contain aspirin 1 week before or during your menstrual period. Aspirin may make bleeding worse. General instructions  If you need to change your sanitary pad or tampon more than once every 2 hours, limit your activity until the bleeding stops.  Iron pills can cause constipation. To prevent or treat constipation while you are taking prescription iron supplements, your health care provider may recommend that you: ? Drink enough fluid to keep your urine clear or pale yellow. ? Take over-the-counter or prescription medicines. ? Eat foods that are high in fiber, such as fresh fruits and vegetables, whole grains, and beans. ? Limit foods that are high in fat and processed sugars, such as fried and sweet foods.  Eat well-balanced meals, including foods that are high in iron.  Foods that have a lot of iron include leafy green vegetables, meat, liver, eggs, and whole grain  breads and cereals.  Do not try to lose weight until the abnormal bleeding has stopped and your blood iron level is back to normal. If you need to lose weight, work with your health care provider to lose weight safely.  Keep all follow-up visits as told by your health care provider. This is important. Contact a health care provider if:  You soak through a pad or tampon every 1 or 2 hours, and this happens every time you have a period.  You need to use pads and tampons at the same time because you are bleeding so much.  You have nausea, vomiting, diarrhea, or other problems related to medicines you are taking. Get help right away if:  You soak through more than a pad or tampon in 1 hour.  You pass clots bigger than 1 inch (2.5 cm) wide.  You feel short of breath.  You feel like your heart is beating too fast.  You feel dizzy or faint.  You feel very weak or tired. Summary  Menorrhagia is a condition in which menstrual periods are heavy or last longer than normal.  Treatment will depend on the cause of the condition and may include medicines or procedures.  Take over-the-counter and prescription medicines exactly as told by your health care provider. This includes iron pills.  Get help right away if you have heavy bleeding that soaks through more than a pad or tampon in 1 hour, you are passing large clots, or you feel dizzy, faint or short of breath. This information is not intended to replace advice given to you by your health care provider. Make sure you discuss any questions you have with your health care provider. Document Released: 04/17/2005 Document Revised: 04/10/2016 Document Reviewed: 04/10/2016 Elsevier Interactive Patient Education  [employer].

## 2018-03-28 LAB — IRON,TIBC AND FERRITIN PANEL
FERRITIN: 23 ng/mL (ref 15–150)
IRON SATURATION: 50 % (ref 15–55)
Iron: 123 ug/dL (ref 27–159)
Total Iron Binding Capacity: 244 ug/dL — ABNORMAL LOW (ref 250–450)
UIBC: 121 ug/dL — ABNORMAL LOW (ref 131–425)

## 2018-03-28 LAB — HEMOGLOBIN AND HEMATOCRIT, BLOOD
HEMATOCRIT: 36.9 % (ref 34.0–46.6)
Hemoglobin: 12.4 g/dL (ref 11.1–15.9)

## 2018-03-28 LAB — ESTRADIOL: Estradiol: 322.5 pg/mL

## 2018-03-28 LAB — PROGESTERONE: Progesterone: 0.3 ng/mL

## 2018-03-28 LAB — FSH/LH
FSH: 13.9 m[IU]/mL
LH: 22.7 m[IU]/mL

## 2018-03-31 ENCOUNTER — Encounter: Payer: Self-pay | Admitting: Obstetrics and Gynecology

## 2018-04-08 ENCOUNTER — Encounter: Payer: Self-pay | Admitting: Family Medicine

## 2018-04-10 ENCOUNTER — Encounter: Payer: Self-pay | Admitting: Family Medicine

## 2018-04-10 ENCOUNTER — Ambulatory Visit
Admission: RE | Admit: 2018-04-10 | Discharge: 2018-04-10 | Disposition: A | Discharge status: Home / Self Care | Payer: BLUE CROSS/BLUE SHIELD | Source: Ambulatory Visit | Attending: Family Medicine | Admitting: Family Medicine

## 2018-04-10 ENCOUNTER — Ambulatory Visit: Payer: BLUE CROSS/BLUE SHIELD | Admitting: Family Medicine

## 2018-04-10 VITALS — BP 125/82 | HR 77 | Temp 98.1°F | Ht 63.0 in | Wt 148.4 lb

## 2018-04-10 DIAGNOSIS — R0602 Shortness of breath: Secondary | ICD-10-CM

## 2018-04-10 DIAGNOSIS — R1902 Left upper quadrant abdominal swelling, mass and lump: Secondary | ICD-10-CM

## 2018-04-10 DIAGNOSIS — R5382 Chronic fatigue, unspecified: Secondary | ICD-10-CM | POA: Diagnosis not present

## 2018-04-10 DIAGNOSIS — Z8249 Family history of ischemic heart disease and other diseases of the circulatory system: Secondary | ICD-10-CM

## 2018-04-10 DIAGNOSIS — R079 Chest pain, unspecified: Secondary | ICD-10-CM | POA: Diagnosis not present

## 2018-04-10 NOTE — Progress Notes (Signed)
BP 125/82 (BP Location: Left Arm, Patient Position: Sitting, Cuff Size: Normal)   Pulse 77   Temp 98.1 F (36.7 C)   Ht 5' 3"  (1.6 m)   Wt 148 lb 7 oz (67.3 kg)   LMP 03/18/2018   SpO2 100%   BMI 26.29 kg/m    Subjective:    Patient ID: Judy Munoz, female    DOB: 10/28/1970, 47 y.o.   MRN: [id number]  HPI: Judy Munoz is a 47 y.o. female  Chief Complaint  Patient presents with  . Fatigue  . Back Pain  . Shortness of Breath    Patient states that with any activity she has to take a deep breath   . Mass    over left front rib cage   Has been very fatigued for the last 3 weeks. Notes that she has been feeling short of breath. She notes that she is feeling winded doing anything. She notes that she has been having pains in her upper shoulders and in her legs. She notes that with any type of exertion, she is having increased pain in her upper back and more SOB. She has been sleeping OK, has not been feeling better when she wakes up. She notes that she has been short of breath and tired. Saw her OBGYN and had her iron levels checked, her Hgb had gone down a little bit, but her iron studies were normal. She is otherwise doing OK with no other concerns or complaints at this time.   Relevant past medical, surgical, family and social history reviewed and updated as indicated. Interim medical history since our last visit reviewed. Allergies and medications reviewed and updated.  Review of Systems  Constitutional: Negative.   HENT: Negative.   Respiratory: Positive for chest tightness and shortness of breath. Negative for apnea, cough, choking, wheezing and stridor.   Cardiovascular: Positive for chest pain. Negative for palpitations and leg swelling.  Gastrointestinal: Negative.   Genitourinary: Negative.   Musculoskeletal: Negative.   Neurological: Negative.   Psychiatric/Behavioral: Negative.     Per HPI unless specifically indicated above     Objective:    BP  125/82 (BP Location: Left Arm, Patient Position: Sitting, Cuff Size: Normal)   Pulse 77   Temp 98.1 F (36.7 C)   Ht 5' 3"  (1.6 m)   Wt 148 lb 7 oz (67.3 kg)   LMP 03/18/2018   SpO2 100%   BMI 26.29 kg/m   Wt Readings from Last 3 Encounters:  04/10/18 148 lb 7 oz (67.3 kg)  03/27/18 150 lb 4.8 oz (68.2 kg)  12/24/17 149 lb 2 oz (67.6 kg)    Physical Exam Vitals signs and nursing note reviewed.  Constitutional:      General: She is not in acute distress.    Appearance: Normal appearance. She is well-developed and normal weight. She is not ill-appearing, toxic-appearing or diaphoretic.  HENT:     Head: Normocephalic and atraumatic.     Right Ear: External ear normal.     Left Ear: External ear normal.     Nose: Nose normal.     Mouth/Throat:     Mouth: Mucous membranes are moist.     Pharynx: Oropharynx is clear.  Eyes:     General: No scleral icterus.       Right eye: No discharge.        Left eye: No discharge.     Extraocular Movements: Extraocular movements intact.  Conjunctiva/sclera: Conjunctivae normal.     Pupils: Pupils are equal, round, and reactive to light.  Neck:     Musculoskeletal: Normal range of motion and neck supple.  Cardiovascular:     Rate and Rhythm: Normal rate and regular rhythm.     Pulses: Normal pulses.     Heart sounds: Normal heart sounds. No murmur. No friction rub. No gallop.   Pulmonary:     Effort: Pulmonary effort is normal. No respiratory distress.     Breath sounds: Normal breath sounds. No stridor. No decreased breath sounds, wheezing, rhonchi or rales.  Chest:     Chest wall: Mass (Lower Left ribs- ribs 8-10, soft) present. No deformity, tenderness, crepitus or edema. There is no dullness to percussion.  Abdominal:     General: Bowel sounds are normal.     Palpations: Abdomen is soft. There is no hepatomegaly, splenomegaly or mass.     Tenderness: There is no abdominal tenderness. There is no guarding or rebound.    Musculoskeletal: Normal range of motion.  Skin:    General: Skin is warm and dry.     Capillary Refill: Capillary refill takes less than 2 seconds.     Coloration: Skin is not cyanotic, jaundiced or pale.     Findings: No bruising, ecchymosis, erythema, lesion or rash.     Nails: There is no clubbing.   Neurological:     General: No focal deficit present.     Mental Status: She is alert and oriented to person, place, and time. Mental status is at baseline.     Cranial Nerves: No cranial nerve deficit.     Motor: No weakness.  Psychiatric:        Mood and Affect: Mood normal. Mood is not anxious.        Behavior: Behavior normal. Behavior is not agitated.        Thought Content: Thought content normal.        Judgment: Judgment normal.     Results for orders placed or performed in visit on 04/10/18  CBC with Differential/Platelet  Result Value Ref Range   WBC 6.4 3.4 - 10.8 x10E3/uL   RBC 4.38 3.77 - 5.28 x10E6/uL   Hemoglobin 13.2 11.1 - 15.9 g/dL   Hematocrit 37.7 34.0 - 46.6 %   MCV 86 79 - 97 fL   MCH 30.1 26.6 - 33.0 pg   MCHC 35.0 31.5 - 35.7 g/dL   RDW 13.0 12.3 - 15.4 %   Platelets 295 150 - 450 x10E3/uL   Neutrophils 57 Not Estab. %   Lymphs 35 Not Estab. %   Monocytes 7 Not Estab. %   Eos 0 Not Estab. %   Basos 1 Not Estab. %   Neutrophils Absolute 3.6 1.4 - 7.0 x10E3/uL   Lymphocytes Absolute 2.2 0.7 - 3.1 x10E3/uL   Monocytes Absolute 0.5 0.1 - 0.9 x10E3/uL   EOS (ABSOLUTE) 0.0 0.0 - 0.4 x10E3/uL   Basophils Absolute 0.0 0.0 - 0.2 x10E3/uL   Immature Granulocytes 0 Not Estab. %   Immature Grans (Abs) 0.0 0.0 - 0.1 x10E3/uL  TSH  Result Value Ref Range   TSH 2.210 0.450 - 4.500 uIU/mL  VITAMIN D 25 Hydroxy (Vit-D Deficiency, Fractures)  Result Value Ref Range   Vit D, 25-Hydroxy 29.1 (L) 30.0 - 100.0 ng/mL  Lyme Ab/Western Blot Reflex  Result Value Ref Range   Lyme IgG/IgM Ab <0.91 0.00 - 0.90 ISR   LYME DISEASE AB, QUANT, IGM <0.80  0.00 - 0.79 index   Babesia microti Antibody Panel  Result Value Ref Range   Babesia microti IgM <1:10 Neg:<1:10   Babesia microti IgG Comment ZOX:<0:96  Ehrlichia Antibody Panel  Result Value Ref Range   E.Chaffeensis (HME) IgG Negative Neg:<1:64   E. Chaffeensis (HME) IgM Titer Negative Neg:<1:20   HGE IgG Titer Negative Neg:<1:64   HGE IgM Titer Negative Neg:<1:20  Rocky mtn spotted fvr abs pnl(IgG+IgM)  Result Value Ref Range   RMSF IgG WILL FOLLOW    RMSF, IGG, IFA WILL FOLLOW    RMSF IgM WILL FOLLOW       Assessment & Plan:   Problem List Items Addressed This Visit    None    Visit Diagnoses    Chronic fatigue    -  Primary   New onset about 3 weeks ago. Will repeat CBC and check thyroid and tick diseases. Monitor closely- will get her into cardiology. Call with concerns.    Relevant Orders   CBC with Differential/Platelet (Completed)   TSH (Completed)   VITAMIN D 25 Hydroxy (Vit-D Deficiency, Fractures) (Completed)   Lyme Ab/Western Blot Reflex (Completed)   Babesia microti Antibody Panel (Completed)   Ehrlichia Antibody Panel (Completed)   Rocky mtn spotted fvr abs pnl(IgG+IgM) (Completed)   SOB (shortness of breath)       Will check CXR today and check labs. Will get her into see cardiology. Await results.    Relevant Orders   EKG 12-Lead (Completed)   DG Chest 2 View (Completed)   Ambulatory referral to Cardiology   Abdominal mass, LUQ (left upper quadrant)       Will check x-ray and [place]. Await results. Checking labs.    Relevant Orders   US Abdomen Limited   Chest pain, unspecified type       EKG normal today. Will check labs and get her into see cardiology. Call with any concerns. Warning signs for which to go to the ER discussed.    Relevant Orders   Ambulatory referral to Cardiology   Family history of cardiovascular disease       Will get her into see cardiology for further evaluation. Referral generated today.    Relevant Orders   Ambulatory referral to Cardiology        Follow up plan: Return in about 2 weeks (around 04/24/2018) for follow up.

## 2018-04-11 ENCOUNTER — Encounter: Payer: Self-pay | Admitting: Family Medicine

## 2018-04-12 ENCOUNTER — Encounter: Payer: Self-pay | Admitting: Family Medicine

## 2018-04-12 LAB — LYME AB/WESTERN BLOT REFLEX: Lyme IgG/IgM Ab: <0.91 {ISR} (ref 0.00–0.90)

## 2018-04-12 LAB — CBC WITH DIFFERENTIAL/PLATELET
Basophils Absolute: 0 10*3/uL (ref 0.0–0.2)
Basos: 1 %
EOS (ABSOLUTE): 0 10*3/uL (ref 0.0–0.4)
EOS: 0 %
HEMATOCRIT: 37.7 % (ref 34.0–46.6)
HEMOGLOBIN: 13.2 g/dL (ref 11.1–15.9)
Immature Grans (Abs): 0 10*3/uL (ref 0.0–0.1)
Immature Granulocytes: 0 %
LYMPHS: 35 %
Lymphocytes Absolute: 2.2 10*3/uL (ref 0.7–3.1)
MCH: 30.1 pg (ref 26.6–33.0)
MCHC: 35 g/dL (ref 31.5–35.7)
MCV: 86 fL (ref 79–97)
MONOCYTES: 7 %
Monocytes Absolute: 0.5 10*3/uL (ref 0.1–0.9)
NEUTROS ABS: 3.6 10*3/uL (ref 1.4–7.0)
Neutrophils: 57 %
PLATELETS: 295 10*3/uL (ref 150–450)
RBC: 4.38 x10E6/uL (ref 3.77–5.28)
RDW: 13 % (ref 12.3–15.4)
WBC: 6.4 10*3/uL (ref 3.4–10.8)

## 2018-04-12 LAB — ROCKY MTN SPOTTED FVR ABS PNL(IGG+IGM)
RMSF IGG: UNDETERMINED
RMSF IgM: 0.44 index (ref 0.00–0.89)

## 2018-04-12 LAB — EHRLICHIA ANTIBODY PANEL
E. CHAFFEENSIS IGG AB: NEGATIVE
E. Chaffeensis (HME) IgM Titer: NEGATIVE
HGE IGG TITER: NEGATIVE
HGE IgM Titer: NEGATIVE

## 2018-04-12 LAB — BABESIA MICROTI ANTIBODY PANEL
Babesia microti IgG: 1:20 {titer} — ABNORMAL HIGH
Babesia microti IgM: <1:10 {titer}

## 2018-04-12 LAB — RMSF, IGG, IFA: RMSF, IGG, IFA: <1:64 {titer}

## 2018-04-12 LAB — VITAMIN D 25 HYDROXY (VIT D DEFICIENCY, FRACTURES): VIT D 25 HYDROXY: 29.1 ng/mL — AB (ref 30.0–100.0)

## 2018-04-12 LAB — TSH: TSH: 2.21 u[IU]/mL (ref 0.450–4.500)

## 2018-04-16 ENCOUNTER — Encounter: Payer: BLUE CROSS/BLUE SHIELD | Admitting: Obstetrics and Gynecology

## 2018-04-16 ENCOUNTER — Other Ambulatory Visit: Payer: BLUE CROSS/BLUE SHIELD

## 2018-04-16 ENCOUNTER — Ambulatory Visit (INDEPENDENT_AMBULATORY_CARE_PROVIDER_SITE_OTHER): Payer: BLUE CROSS/BLUE SHIELD | Admitting: Obstetrics and Gynecology

## 2018-04-16 ENCOUNTER — Ambulatory Visit (INDEPENDENT_AMBULATORY_CARE_PROVIDER_SITE_OTHER): Payer: BLUE CROSS/BLUE SHIELD

## 2018-04-16 ENCOUNTER — Other Ambulatory Visit (HOSPITAL_COMMUNITY)
Admission: RE | Admit: 2018-04-16 | Discharge: 2018-04-16 | Disposition: A | Discharge status: Home / Self Care | Payer: BLUE CROSS/BLUE SHIELD | Source: Ambulatory Visit | Attending: Obstetrics and Gynecology | Admitting: Obstetrics and Gynecology

## 2018-04-16 ENCOUNTER — Encounter: Payer: Self-pay | Admitting: Obstetrics and Gynecology

## 2018-04-16 VITALS — BP 125/84 | HR 67 | Ht 63.0 in | Wt 147.1 lb

## 2018-04-16 DIAGNOSIS — N921 Excessive and frequent menstruation with irregular cycle: Secondary | ICD-10-CM | POA: Insufficient documentation

## 2018-04-16 DIAGNOSIS — N924 Excessive bleeding in the premenopausal period: Secondary | ICD-10-CM | POA: Diagnosis not present

## 2018-04-16 DIAGNOSIS — D259 Leiomyoma of uterus, unspecified: Secondary | ICD-10-CM | POA: Diagnosis not present

## 2018-04-16 NOTE — Progress Notes (Signed)
Pt follow up after ultrasound.

## 2018-04-16 NOTE — Progress Notes (Signed)
GYNECOLOGY PROGRESS NOTE  Subjective:    Patient ID: Judy Munoz, female    DOB: Mar 07, 1971, 47 y.o.   MRN: [id number]  HPI  Patient is a 47 y.o. G69P1001 female who presents for f/u after ultrasound results for abnormal uterine bleeding.  She notes that her cycles are are q 19-25 days, lasting 4-5 days with passage of large clots.  She has had 2 months where she has had 2 cycles during the month.     The following portions of the patient's history were reviewed and updated as appropriate: allergies, current medications, past family history, past medical history, past social history, past surgical history and problem list.  Review of Systems Pertinent items noted in HPI and remainder of comprehensive ROS otherwise negative.   Objective:   Blood pressure 125/84, pulse 67, height 5' 3"  (1.6 m), weight 147 lb 1.6 oz (66.7 kg), last menstrual period 04/11/2018. General appearance: alert and no distress Abdomen: soft, non-tender; bowel sounds normal; no masses,  no organomegaly Pelvic: external genitalia normal, rectovaginal septum normal.  Vagina without discharge.  Cervix normal appearing, no lesions and no motion tenderness.  Uterus mobile, nontender, normal shape and size.  Adnexae non-palpable, nontender bilaterally.  Extremities: extremities normal, atraumatic, no cyanosis or edema Neurologic: Grossly normal    Labs:  Lab Results  Component Value Date   WBC 6.4 04/10/2018   HGB 13.2 04/10/2018   HCT 37.7 04/10/2018   MCV 86 04/10/2018   PLT 295 04/10/2018    Lab Results  Component Value Date   FERRITIN 23 03/27/2018    Lab Results  Component Value Date   TSH 2.210 04/10/2018     Imaging:   Patient Name: Judy Munoz DOB: 05/31/1970 MRN: [id number]  ULTRASOUND REPORT  Location: [facility]  Date of Service: 04/16/2018     Indications:PMB Findings:  The uterus is retroverted and measures 7.7x4.1cm. Echo texture is heterogenous with  evidence of focal masses. A hyperechoic area = 2.4x1.5 cm in the posterior uterine wall is seen with other evidences of adenomyosis (a mottled inhomogeneous myometrial texture, globular appearing uterus, small cystic spaces within the myometrium  Fibroid 1:is seen in the anterior uterine wall = 2x2cm with a central cakcification area = 1x1cm The Endometrium measures 7.5 mm.  Right Ovary measures 1.6 cm3. It is normal in appearance. Left Ovary measures 1.8 cm3. It is normal in appearance. Survey of the adnexa demonstrates no adnexal masses. There is no free fluid in the cul de sac.  Impression: 1. Echo texture is heterogenous with evidence of focal masses. A hyperechoic area = 2.4x1.5 cm in the posterior uterine wall is seen with other evidences of adenomyosis (a mottled inhomogeneous myometrial texture, globular appearing uterus, small cystic spaces within the myometrium 2.  Fibroid 1:is seen in the anterior uterine wall = 2x2cm with a central calcification area = 1x1cm, (retroverted uterus)  Recommendations: 1.Clinical correlation with the patient's History and Physical Exam.   Judy Munoz,RDMS Assessment:   Abnormal uterine bleeding (likely perimenopausal) Possible adenomyosis  Plan:   - Patient has abnormal uterine bleeding . She has a normal exam, no evidence of lesions. Ultrasound notes possible adenomyosis, and small calcified fibroid. Will complete workup with endometrial biopsy today (see procedure note below).    - Discussed management options for abnormal uterine bleeding including, tranexamic acid (Lysteda), oral progesterone, Depo Provera, Levonogestrel IUD, endometrial ablation or hysterectomy as definitive surgical management.  Discussed risks and benefits of each method.  Patient still unsure of desires, hesitant about use of hormonal options, may consider surgical with ablation or hysterectomy.  Printed patient education handouts were given to the patient to  review at home. Offered 1 month trial of Bijuva (as it was more natural) to help with bleeding until decision made; bleeding precautions reviewed.     Endometrial Biopsy Procedure Note  The patient is positioned on the exam table in the dorsal lithotomy position. Bimanual exam confirms uterine position and size. A Graves speculum is placed into the vagina. A single toothed tenaculum is placed onto the anterior lip of the cervix. The pipette is placed into the endocervical canal and is advanced to the uterine fundus. Using a piston like technique, with vacuum created by withdrawing the stylus, the endometrial specimen is obtained and transferred to the biopsy container. Minimal bleeding is encountered. The procedure is well tolerated.   Uterine Position: mid    Uterine Length:  7 cm   Uterine Specimen: Average   Post procedure instructions are given. The patient is scheduled for follow up appointment.    Judy Maid, MD [facility] 04/20/2018 5:26 PM

## 2018-04-18 ENCOUNTER — Ambulatory Visit
Admission: RE | Admit: 2018-04-18 | Discharge: 2018-04-18 | Disposition: A | Discharge status: Home / Self Care | Payer: BLUE CROSS/BLUE SHIELD | Source: Ambulatory Visit | Attending: Family Medicine | Admitting: Family Medicine

## 2018-04-18 ENCOUNTER — Encounter: Payer: Self-pay | Admitting: Family Medicine

## 2018-04-18 DIAGNOSIS — R1902 Left upper quadrant abdominal swelling, mass and lump: Secondary | ICD-10-CM | POA: Insufficient documentation

## 2018-04-18 DIAGNOSIS — Z1231 Encounter for screening mammogram for malignant neoplasm of breast: Secondary | ICD-10-CM | POA: Diagnosis present

## 2018-04-19 ENCOUNTER — Ambulatory Visit (HOSPITAL_COMMUNITY): Payer: BLUE CROSS/BLUE SHIELD

## 2018-04-20 ENCOUNTER — Encounter: Payer: Self-pay | Admitting: Obstetrics and Gynecology

## 2018-04-21 ENCOUNTER — Encounter: Payer: Self-pay | Admitting: Family Medicine

## 2018-04-22 MED ORDER — ATOVAQUONE 750 MG/5ML PO SUSP: 750.0000 mg | Freq: Two times a day (BID) | ORAL | 0 refills | Status: AC

## 2018-04-22 MED ORDER — AZITHROMYCIN 250 MG PO TABS: ORAL_TABLET | ORAL | 0 refills | Status: DC

## 2018-04-23 ENCOUNTER — Telehealth: Payer: Self-pay | Admitting: Obstetrics and Gynecology

## 2018-04-23 NOTE — Telephone Encounter (Signed)
Patient called requesting biopsy results. Thanks

## 2018-04-30 ENCOUNTER — Encounter: Payer: Self-pay | Admitting: Family Medicine

## 2018-04-30 ENCOUNTER — Ambulatory Visit: Payer: BLUE CROSS/BLUE SHIELD | Admitting: Family Medicine

## 2018-04-30 VITALS — BP 112/62 | HR 70 | Temp 98.2°F | Wt 141.0 lb

## 2018-04-30 DIAGNOSIS — B6000 Babesiosis, unspecified: Secondary | ICD-10-CM

## 2018-04-30 DIAGNOSIS — R0602 Shortness of breath: Secondary | ICD-10-CM

## 2018-04-30 MED ORDER — ALBUTEROL SULFATE HFA 108 (90 BASE) MCG/ACT IN AERS: 2.0000 | INHALATION_SPRAY | Freq: Four times a day (QID) | RESPIRATORY_TRACT | 0 refills | Status: DC | PRN

## 2018-04-30 NOTE — Progress Notes (Signed)
BP 112/62 (BP Location: Left Arm, Cuff Size: Normal)   Pulse 70   Temp 98.2 F (36.8 C) (Oral)   Wt 141 lb (64 kg)   LMP 04/11/2018   SpO2 99%   BMI 24.98 kg/m    Subjective:    Patient ID: Judy Munoz, female    DOB: January 16, 1971, 47 y.o.   MRN: [id number]  HPI: Judy Munoz is a 47 y.o. female  Chief Complaint  Patient presents with  . Fatigue    Better, not 100%  . Shortness of Breath    Continues, but better   [place] of mass on abdomen normal- nothing seen, likely adipose distribution. CXR was normal.  Seeing Cardiology on 05/15/18 Elevated titre for babesia- unclear if this was a real exposure, but treated, she is feeling better. Started on azithromycin and atovaquone 1 week ago just finished them. Joint pain is gone. Still feeling short of breath with mild exertion. Having tight muscles in her L shoulder. She is sleeping better. She is doing better for the past 2 days. She tolerated her medicine OK. Getting winded with mild activity. She is very concerned about this especially since her dad died young from a heart attack. She is due to go on a 500 mile car trip tomorrow. No other concerns or complaints at this time.   Relevant past medical, surgical, family and social history reviewed and updated as indicated. Interim medical history since our last visit reviewed. Allergies and medications reviewed and updated.  Review of Systems  Constitutional: Negative.   Respiratory: Positive for shortness of breath. Negative for apnea, cough, choking, chest tightness, wheezing and stridor.   Cardiovascular: Negative.   Musculoskeletal: Negative.   Neurological: Negative.   Psychiatric/Behavioral: Negative.     Per HPI unless specifically indicated above     Objective:    BP 112/62 (BP Location: Left Arm, Cuff Size: Normal)   Pulse 70   Temp 98.2 F (36.8 C) (Oral)   Wt 141 lb (64 kg)   LMP 04/11/2018   SpO2 99%   BMI 24.98 kg/m   Wt Readings from Last 3  Encounters:  04/30/18 141 lb (64 kg)  04/16/18 147 lb 1.6 oz (66.7 kg)  04/10/18 148 lb 7 oz (67.3 kg)    Physical Exam Vitals signs and nursing note reviewed.  Constitutional:      General: She is not in acute distress.    Appearance: Normal appearance. She is not ill-appearing, toxic-appearing or diaphoretic.  HENT:     Head: Normocephalic and atraumatic.     Right Ear: External ear normal.     Left Ear: External ear normal.     Nose: Nose normal.     Mouth/Throat:     Mouth: Mucous membranes are moist.     Pharynx: Oropharynx is clear.  Eyes:     General: No scleral icterus.       Right eye: No discharge.        Left eye: No discharge.     Extraocular Movements: Extraocular movements intact.     Conjunctiva/sclera: Conjunctivae normal.     Pupils: Pupils are equal, round, and reactive to light.  Neck:     Musculoskeletal: Normal range of motion and neck supple.  Cardiovascular:     Rate and Rhythm: Normal rate and regular rhythm.     Pulses: Normal pulses.     Heart sounds: Normal heart sounds. No murmur. No friction rub. No gallop.   Pulmonary:  Effort: Pulmonary effort is normal. No respiratory distress.     Breath sounds: Normal breath sounds. No stridor. No wheezing, rhonchi or rales.  Chest:     Chest wall: No tenderness.  Musculoskeletal: Normal range of motion.  Skin:    General: Skin is warm and dry.     Capillary Refill: Capillary refill takes less than 2 seconds.     Coloration: Skin is not jaundiced or pale.     Findings: No bruising, erythema, lesion or rash.  Neurological:     General: No focal deficit present.     Mental Status: She is alert and oriented to person, place, and time. Mental status is at baseline.  Psychiatric:        Mood and Affect: Mood normal.        Behavior: Behavior normal.        Thought Content: Thought content normal.        Judgment: Judgment normal.     Results for orders placed or performed in visit on 04/10/18  CBC  with Differential/Platelet  Result Value Ref Range   WBC 6.4 3.4 - 10.8 x10E3/uL   RBC 4.38 3.77 - 5.28 x10E6/uL   Hemoglobin 13.2 11.1 - 15.9 g/dL   Hematocrit 37.7 34.0 - 46.6 %   MCV 86 79 - 97 fL   MCH 30.1 26.6 - 33.0 pg   MCHC 35.0 31.5 - 35.7 g/dL   RDW 13.0 12.3 - 15.4 %   Platelets 295 150 - 450 x10E3/uL   Neutrophils 57 Not Estab. %   Lymphs 35 Not Estab. %   Monocytes 7 Not Estab. %   Eos 0 Not Estab. %   Basos 1 Not Estab. %   Neutrophils Absolute 3.6 1.4 - 7.0 x10E3/uL   Lymphocytes Absolute 2.2 0.7 - 3.1 x10E3/uL   Monocytes Absolute 0.5 0.1 - 0.9 x10E3/uL   EOS (ABSOLUTE) 0.0 0.0 - 0.4 x10E3/uL   Basophils Absolute 0.0 0.0 - 0.2 x10E3/uL   Immature Granulocytes 0 Not Estab. %   Immature Grans (Abs) 0.0 0.0 - 0.1 x10E3/uL  TSH  Result Value Ref Range   TSH 2.210 0.450 - 4.500 uIU/mL  VITAMIN D 25 Hydroxy (Vit-D Deficiency, Fractures)  Result Value Ref Range   Vit D, 25-Hydroxy 29.1 (L) 30.0 - 100.0 ng/mL  Lyme Ab/Western Blot Reflex  Result Value Ref Range   Lyme IgG/IgM Ab <0.91 0.00 - 0.90 ISR   LYME DISEASE AB, QUANT, IGM <0.80 0.00 - 0.79 index  Babesia microti Antibody Panel  Result Value Ref Range   Babesia microti IgM <1:10 Neg:<1:10   Babesia microti IgG 1:20 (H) GYK:<5:99  Ehrlichia Antibody Panel  Result Value Ref Range   E.Chaffeensis (HME) IgG Negative Neg:<1:64   E. Chaffeensis (HME) IgM Titer Negative Neg:<1:20   HGE IgG Titer Negative Neg:<1:64   HGE IgM Titer Negative Neg:<1:20  Rocky mtn spotted fvr abs pnl(IgG+IgM)  Result Value Ref Range   RMSF IgG Equivocal Negative   RMSF IgM 0.44 0.00 - 0.89 index  RMSF, IgG, IFA  Result Value Ref Range   RMSF, IGG, IFA <1:64 Neg <1:64      Assessment & Plan:   Problem List Items Addressed This Visit    None    Visit Diagnoses    SOB (shortness of breath)    -  Primary   Mild obstruction on spiro. Will treat with albuterol. Call with any concerns. To see cardiology on 1/2. Checking d-dimer  today- await results.  Relevant Orders   Spirometry with graph (Completed)   D-Dimer, Quantitative   Babesiasis       Unclear if real diagnosis- was treated. Will check smear to confirm no intracellular parasites. Has been treated already.   Relevant Orders   Pathologist smear review       Follow up plan: Return if symptoms worsen or fail to improve.

## 2018-05-01 LAB — D-DIMER, QUANTITATIVE: D-DIMER: <0.2 mg/L FEU (ref 0.00–0.49)

## 2018-05-02 ENCOUNTER — Encounter: Payer: Self-pay | Admitting: Family Medicine

## 2018-05-02 ENCOUNTER — Telehealth: Payer: Self-pay | Admitting: Family Medicine

## 2018-05-02 NOTE — Telephone Encounter (Signed)
query

## 2018-05-03 NOTE — Telephone Encounter (Signed)
Pt is aware of her test results via mychart.

## 2018-05-03 NOTE — Telephone Encounter (Signed)
Copied from [place] [id number]. Topic: General - Other >> Apr 30, 2018 10:35 AM Yvette Rack wrote: Reason for CRM: Pt stated Dr. Wynetta Emery advised her that she has an appt at [facility] on Thursday 05/02/18 at 2:15 pm but she does not have any documentation of the appt nor does she know the location. Pt requests call back. Cb# [phone] >> May 02, 2018  9:36 AM Don Perking M wrote: I don't see this appt on there, can you take a look for me? >> May 02, 2018  2:44 PM Bea Graff, NT wrote: Pt states that the [facility] nor [place] takes her insurance [facility]) and she will have to wait until her appt scheduled for [facility] on 05/15/2018. >> May 03, 2018  9:59 AM Don Perking M wrote: Damaris Schooner with pt and she stated that she would be going to [facility] 05/08/2018.

## 2018-05-04 LAB — PATHOLOGIST SMEAR REVIEW
Basophils Absolute: 0.1 10*3/uL (ref 0.0–0.2)
Basos: 1 %
EOS (ABSOLUTE): 0 10*3/uL (ref 0.0–0.4)
Eos: 1 %
Hematocrit: 37.2 % (ref 34.0–46.6)
Hemoglobin: 12.7 g/dL (ref 11.1–15.9)
Immature Grans (Abs): 0 10*3/uL (ref 0.0–0.1)
Immature Granulocytes: 0 %
LYMPHS ABS: 2.2 10*3/uL (ref 0.7–3.1)
Lymphs: 30 %
MCH: 29.1 pg (ref 26.6–33.0)
MCHC: 34.1 g/dL (ref 31.5–35.7)
MCV: 85 fL (ref 79–97)
Monocytes Absolute: 0.5 10*3/uL (ref 0.1–0.9)
Monocytes: 7 %
NEUTROS PCT: 61 %
Neutrophils Absolute: 4.5 10*3/uL (ref 1.4–7.0)
Path Rev PLTs: NORMAL
Path Rev RBC: NORMAL
Path Rev WBC: NORMAL
Platelets: 320 10*3/uL (ref 150–450)
RBC: 4.37 x10E6/uL (ref 3.77–5.28)
RDW: 12.9 % (ref 12.3–15.4)
WBC: 7.3 10*3/uL (ref 3.4–10.8)

## 2018-05-14 DIAGNOSIS — E785 Hyperlipidemia, unspecified: Secondary | ICD-10-CM | POA: Insufficient documentation

## 2018-05-14 DIAGNOSIS — Z8619 Personal history of other infectious and parasitic diseases: Secondary | ICD-10-CM | POA: Insufficient documentation

## 2018-05-23 ENCOUNTER — Other Ambulatory Visit: Payer: Self-pay | Admitting: Family Medicine

## 2018-06-27 ENCOUNTER — Other Ambulatory Visit: Payer: Self-pay

## 2018-06-27 ENCOUNTER — Ambulatory Visit (INDEPENDENT_AMBULATORY_CARE_PROVIDER_SITE_OTHER): Payer: BLUE CROSS/BLUE SHIELD | Admitting: Family Medicine

## 2018-06-27 ENCOUNTER — Encounter: Payer: Self-pay | Admitting: Family Medicine

## 2018-06-27 VITALS — BP 107/73 | HR 63 | Temp 97.4°F | Ht 63.0 in | Wt 150.6 lb

## 2018-06-27 DIAGNOSIS — E559 Vitamin D deficiency, unspecified: Secondary | ICD-10-CM

## 2018-06-27 DIAGNOSIS — D649 Anemia, unspecified: Secondary | ICD-10-CM

## 2018-06-27 DIAGNOSIS — E7800 Pure hypercholesterolemia, unspecified: Secondary | ICD-10-CM

## 2018-06-27 DIAGNOSIS — Z0000 Encounter for general adult medical examination without abnormal findings: Secondary | ICD-10-CM | POA: Diagnosis not present

## 2018-06-27 DIAGNOSIS — R7301 Impaired fasting glucose: Secondary | ICD-10-CM | POA: Diagnosis not present

## 2018-06-27 MED ORDER — NAPROXEN 500 MG PO TABS: 500.0000 mg | ORAL_TABLET | Freq: Two times a day (BID) | ORAL | 0 refills | Status: DC

## 2018-06-27 MED ORDER — PANTOPRAZOLE SODIUM 40 MG PO TBEC: 40.0000 mg | DELAYED_RELEASE_TABLET | Freq: Two times a day (BID) | ORAL | 1 refills | Status: DC

## 2018-06-27 MED ORDER — PRAVASTATIN SODIUM 20 MG PO TABS: ORAL_TABLET | ORAL | 1 refills | Status: DC

## 2018-06-27 MED ORDER — ALBUTEROL SULFATE HFA 108 (90 BASE) MCG/ACT IN AERS: 2.0000 | INHALATION_SPRAY | Freq: Four times a day (QID) | RESPIRATORY_TRACT | 3 refills | Status: DC | PRN

## 2018-06-27 NOTE — Assessment & Plan Note (Signed)
Under good control on current regimen. Continue current regimen. Continue to monitor. Call with any concerns. Refills given. Labs checked today.

## 2018-06-27 NOTE — Assessment & Plan Note (Signed)
Rechecking levels today. Treat as needed. Call with any concerns.

## 2018-06-27 NOTE — Patient Instructions (Signed)

## 2018-06-27 NOTE — Progress Notes (Signed)
BP 107/73 (BP Location: Left Arm, Patient Position: Sitting, Cuff Size: Normal)   Pulse 63   Temp (!) 97.4 F (36.3 C)   Ht 5' 3"  (1.6 m)   Wt 150 lb 9 oz (68.3 kg)   SpO2 100%   BMI 26.67 kg/m    Subjective:    Patient ID: Judy Munoz, female    DOB: Jun 18, 1970, 48 y.o.   MRN: [id number]  HPI: Judy Munoz is a 48 y.o. female presenting on 06/27/2018 for comprehensive medical examination. Current medical complaints include:  Feeling better since last visit. SOB is better. Fatigue better.   Impaired Fasting Glucose HbA1C:  Lab Results  Component Value Date   HGBA1C 5.8 12/24/2017   Duration of elevated blood sugar: chronic Polydipsia: no Polyuria: no Weight change: no Visual disturbance: no Glucose Monitoring: no Family history of diabetes: no  HYPERLIPIDEMIA Hyperlipidemia status: excellent compliance Satisfied with current treatment?  no Side effects:  no Medication compliance: excellent compliants Past cholesterol meds: pravastatin Supplements: none Aspirin:  no The 10-year ASCVD risk score [name], et al., 2013) is: 0.4%   Values used to calculate the score:     Age: 67 years     Sex: Female     Is Non-Hispanic African American: No     Diabetic: No     Tobacco smoker: No     Systolic Blood Pressure: 803 mmHg     Is BP treated: No     HDL Cholesterol: 66 mg/dL     Total Cholesterol: 171 mg/dL Chest pain:  no Coronary artery disease:  no  She currently lives with: husband and daughter Menopausal Symptoms: no  Depression Screen done today and results listed below:  Depression screen [facility] 2/9 06/27/2018 06/27/2018 06/20/2017 06/14/2016 12/13/2015  Decreased Interest 0 0 0 0 0  Down, Depressed, Hopeless 0 0 0 0 0  PHQ - 2 Score 0 0 0 0 0  Altered sleeping 0 0 0 - 0  Tired, decreased energy 0 0 1 - 0  Change in appetite 0 0 0 - 0  Feeling bad or failure about yourself  0 0 0 - 0  Trouble concentrating 0 0 0 - 0  Moving slowly or  fidgety/restless 0 0 0 - 0  Suicidal thoughts 0 0 0 - 0  PHQ-9 Score 0 0 1 - 0  Difficult doing work/chores Not difficult at all - Not difficult at all - -    Past Medical History:  Past Medical History:  Diagnosis Date  . Anemia    H/O WITH PREGNANCY  . Biliary dyskinesia   . Elevated glucose   . GERD (gastroesophageal reflux disease)   . Headache    MIGRAINES RARE  . Heart murmur    WAS TOLD THIS ONCE YEARS AGO-HAS NEVER BEEN TOLD SINCE  . High cholesterol   . History of uterine fibroid   . IFG (impaired fasting glucose)   . PONV (postoperative nausea and vomiting)    HAPPENED WITH HER 1ST EGD- HER 2ND EGD SHE WAS GIVEN ANTI-EMETIC IV AND HAD NO N/V  . Vitamin D deficiency     Surgical History:  Past Surgical History:  Procedure Laterality Date  . CHOLECYSTECTOMY N/A 07/18/2016   Procedure: LAPAROSCOPIC CHOLECYSTECTOMY;  Surgeon: Jules Husbands, MD;  Location: [facility];  Service: General;  Laterality: N/A;  . CHOLECYSTECTOMY, LAPAROSCOPIC  07/18/2016  . ESOPHAGOGASTRODUODENOSCOPY    . ESOPHAGOGASTRODUODENOSCOPY (EGD) WITH PROPOFOL N/A 07/26/2015   Procedure:  ESOPHAGOGASTRODUODENOSCOPY (EGD) WITH PROPOFOL;  Surgeon: Lucilla Lame, MD;  Location: [place];  Service: Endoscopy;  Laterality: N/A;    Medications:  Current Outpatient Medications on File Prior to Visit  Medication Sig  . cetirizine (ZYRTEC) 10 MG tablet Take 10 mg by mouth daily.  . Cholecalciferol (VITAMIN D-400 PO) Take 800 mg by mouth.    No current facility-administered medications on file prior to visit.     Allergies:  Allergies  Allergen Reactions  . Sulfa Antibiotics Hives    Social History:  Social History   Socioeconomic History  . Marital status: Married    Spouse name: Not on file  . Number of children: Not on file  . Years of education: Not on file  . Highest education level: Not on file  Occupational History  . Not on file  Social Needs  . Financial resource strain: Not  on file  . Food insecurity:    Worry: Not on file    Inability: Not on file  . Transportation needs:    Medical: Not on file    Non-medical: Not on file  Tobacco Use  . Smoking status: Never Smoker  . Smokeless tobacco: Never Used  Substance and Sexual Activity  . Alcohol use: No  . Drug use: No  . Sexual activity: Yes    Birth control/protection: None  Lifestyle  . Physical activity:    Days per week: 7 days    Minutes per session: 60 min  . Stress: Only a little  Relationships  . Social connections:    Talks on phone: Patient refused    Gets together: Patient refused    Attends religious service: Patient refused    Active member of club or organization: Patient refused    Attends meetings of clubs or organizations: Patient refused    Relationship status: Patient refused  . Intimate partner violence:    Fear of current or ex partner: Patient refused    Emotionally abused: Patient refused    Physically abused: Patient refused    Forced sexual activity: Patient refused  Other Topics Concern  . Not on file  Social History Narrative  . Not on file   Social History   Tobacco Use  Smoking Status Never Smoker  Smokeless Tobacco Never Used   Social History   Substance and Sexual Activity  Alcohol Use No    Family History:  Family History  Problem Relation Age of Onset  . Diabetes Father   . Heart disease Father   . Hypertension Father   . Hyperlipidemia Father   . Thyroid disease Mother   . Cancer Maternal Grandmother        uterine  . Breast cancer Maternal Grandmother 60  . Thyroid disease Maternal Grandmother   . Lung cancer Maternal Uncle     Past medical history, surgical history, medications, allergies, family history and social history reviewed with patient today and changes made to appropriate areas of the chart.   Review of Systems  Constitutional: Negative.   HENT: Negative.   Eyes: Negative.   Respiratory: Positive for shortness of breath.  Negative for cough, hemoptysis, sputum production and wheezing.   Cardiovascular: Negative.   Gastrointestinal: Negative.   Genitourinary: Negative.   Musculoskeletal: Negative.   Skin: Negative.   Endo/Heme/Allergies: Positive for environmental allergies. Negative for polydipsia. Does not bruise/bleed easily.    All other ROS negative except what is listed above and in the HPI.      Objective:  BP 107/73 (BP Location: Left Arm, Patient Position: Sitting, Cuff Size: Normal)   Pulse 63   Temp (!) 97.4 F (36.3 C)   Ht 5' 3"  (1.6 m)   Wt 150 lb 9 oz (68.3 kg)   SpO2 100%   BMI 26.67 kg/m   Wt Readings from Last 3 Encounters:  06/27/18 150 lb 9 oz (68.3 kg)  04/30/18 141 lb (64 kg)  04/16/18 147 lb 1.6 oz (66.7 kg)    Physical Exam Vitals signs and nursing note reviewed.  Constitutional:      General: She is not in acute distress.    Appearance: Normal appearance. She is not ill-appearing, toxic-appearing or diaphoretic.  HENT:     Head: Normocephalic and atraumatic.     Right Ear: Tympanic membrane, ear canal and external ear normal. There is no impacted cerumen.     Left Ear: Tympanic membrane, ear canal and external ear normal. There is no impacted cerumen.     Nose: Nose normal. No congestion or rhinorrhea.     Mouth/Throat:     Mouth: Mucous membranes are moist.     Pharynx: Oropharynx is clear. No oropharyngeal exudate or posterior oropharyngeal erythema.  Eyes:     General: No scleral icterus.       Right eye: No discharge.        Left eye: No discharge.     Extraocular Movements: Extraocular movements intact.     Conjunctiva/sclera: Conjunctivae normal.     Pupils: Pupils are equal, round, and reactive to light.  Neck:     Musculoskeletal: Normal range of motion and neck supple. No neck rigidity or muscular tenderness.     Vascular: No carotid bruit.  Cardiovascular:     Rate and Rhythm: Normal rate and regular rhythm.     Pulses: Normal pulses.     Heart  sounds: No murmur. No friction rub. No gallop.   Pulmonary:     Effort: Pulmonary effort is normal. No respiratory distress.     Breath sounds: Normal breath sounds. No stridor. No wheezing, rhonchi or rales.  Chest:     Chest wall: No tenderness.  Abdominal:     General: Abdomen is flat. Bowel sounds are normal. There is no distension.     Palpations: Abdomen is soft. There is no mass.     Tenderness: There is no abdominal tenderness. There is no right CVA tenderness, left CVA tenderness, guarding or rebound.     Hernia: No hernia is present.  Genitourinary:    Comments: Breast and pelvic exams deferred with shared decision making Musculoskeletal:        General: No swelling, tenderness, deformity or signs of injury.     Right lower leg: No edema.     Left lower leg: No edema.  Lymphadenopathy:     Cervical: No cervical adenopathy.  Skin:    General: Skin is warm and dry.     Capillary Refill: Capillary refill takes less than 2 seconds.     Coloration: Skin is not jaundiced or pale.     Findings: No bruising, erythema, lesion or rash.  Neurological:     General: No focal deficit present.     Mental Status: She is alert and oriented to person, place, and time. Mental status is at baseline.     Cranial Nerves: No cranial nerve deficit.     Sensory: No sensory deficit.     Motor: No weakness.     Coordination: Coordination normal.  Gait: Gait normal.     Deep Tendon Reflexes: Reflexes normal.  Psychiatric:        Mood and Affect: Mood normal.        Behavior: Behavior normal.        Thought Content: Thought content normal.        Judgment: Judgment normal.     Results for orders placed or performed in visit on 04/30/18  D-Dimer, Quantitative  Result Value Ref Range   D-DIMER <0.20 0.00 - 0.49 mg/L FEU  Pathologist smear review  Result Value Ref Range   Path Rev WBC Appear normal.    Path Rev RBC Appear normal.    Path Rev PLTs Appear normal.    PATHOLOGIST NAME  Comment    WBC 7.3 3.4 - 10.8 x10E3/uL   RBC 4.37 3.77 - 5.28 x10E6/uL   Hemoglobin 12.7 11.1 - 15.9 g/dL   Hematocrit 37.2 34.0 - 46.6 %   MCV 85 79 - 97 fL   MCH 29.1 26.6 - 33.0 pg   MCHC 34.1 31.5 - 35.7 g/dL   RDW 12.9 12.3 - 15.4 %   Platelets 320 150 - 450 x10E3/uL   Neutrophils 61 Not Estab. %   Lymphs 30 Not Estab. %   Monocytes 7 Not Estab. %   Eos 1 Not Estab. %   Basos 1 Not Estab. %   Neutrophils Absolute 4.5 1.4 - 7.0 x10E3/uL   Lymphocytes Absolute 2.2 0.7 - 3.1 x10E3/uL   Monocytes Absolute 0.5 0.1 - 0.9 x10E3/uL   EOS (ABSOLUTE) 0.0 0.0 - 0.4 x10E3/uL   Basophils Absolute 0.1 0.0 - 0.2 x10E3/uL   Immature Granulocytes 0 Not Estab. %   Immature Grans (Abs) 0.0 0.0 - 0.1 x10E3/uL      Assessment & Plan:   Problem List Items Addressed This Visit      Endocrine   IFG (impaired fasting glucose)    Rechecking levels today. Treat as needed. Call with any concerns.       Relevant Orders   Comprehensive metabolic panel   Bayer DCA Hb A1c Waived     Other   High cholesterol    Under good control on current regimen. Continue current regimen. Continue to monitor. Call with any concerns. Refills given. Labs checked today.       Relevant Medications   pravastatin (PRAVACHOL) 20 MG tablet   Other Relevant Orders   Comprehensive metabolic panel   Lipid Panel w/o Chol/HDL Ratio   Vitamin D deficiency    Rechecking levels today. Treat as needed. Call with any concerns.       Relevant Orders   Comprehensive metabolic panel   VITAMIN D 25 Hydroxy (Vit-D Deficiency, Fractures)   Anemia    Rechecking levels today. Treat as needed. Call with any concerns.       Relevant Orders   CBC with Differential/Platelet   Comprehensive metabolic panel    Other Visit Diagnoses    Routine general medical examination at a health care facility    -  Primary   Vaccines up to date. Screening labs checked today. Pap up to date. Mammogram up to date. Continue diet and  exercise. Call with any concerns.    Relevant Orders   CBC with Differential/Platelet   Comprehensive metabolic panel   TSH   UA/M w/rflx Culture, Routine   HIV Antibody (routine testing w rflx)       Follow up plan: Return in about 6 months (around 12/26/2018) for 6  month follow up.   LABORATORY TESTING:  - Pap smear: up to date  IMMUNIZATIONS:   - Tdap: Tetanus vaccination status reviewed: last tetanus booster within 10 years. - Influenza: Up to date  SCREENING: -Mammogram: Up to date   PATIENT COUNSELING:   Advised to take 1 mg of folate supplement per day if capable of pregnancy.   Sexuality: Discussed sexually transmitted diseases, partner selection, use of condoms, avoidance of unintended pregnancy  and contraceptive alternatives.   Advised to avoid cigarette smoking.  I discussed with the patient that most people either abstain from alcohol or drink within safe limits (<=14/week and <=4 drinks/occasion for males, <=7/weeks and <= 3 drinks/occasion for females) and that the risk for alcohol disorders and other health effects rises proportionally with the number of drinks per week and how often a drinker exceeds daily limits.  Discussed cessation/primary prevention of drug use and availability of treatment for abuse.   Diet: Encouraged to adjust caloric intake to maintain  or achieve ideal body weight, to reduce intake of dietary saturated fat and total fat, to limit sodium intake by avoiding high sodium foods and not adding table salt, and to maintain adequate dietary potassium and calcium preferably from fresh fruits, vegetables, and low-fat dairy products.    stressed the importance of regular exercise  Injury prevention: Discussed safety belts, safety helmets, smoke detector, smoking near bedding or upholstery.   Dental health: Discussed importance of regular tooth brushing, flossing, and dental visits.    NEXT PREVENTATIVE PHYSICAL DUE IN 1 YEAR. Return in about  6 months (around 12/26/2018) for 6 month follow up.

## 2018-06-28 LAB — MICROSCOPIC EXAMINATION
Bacteria, UA: NONE SEEN
WBC, UA: NONE SEEN /hpf (ref 0–5)

## 2018-06-28 LAB — UA/M W/RFLX CULTURE, ROUTINE
Bilirubin, UA: NEGATIVE
Glucose, UA: NEGATIVE
Ketones, UA: NEGATIVE
Leukocytes, UA: NEGATIVE
Nitrite, UA: NEGATIVE
Protein, UA: NEGATIVE
Specific Gravity, UA: 1.025 (ref 1.005–1.030)
Urobilinogen, Ur: 0.2 mg/dL (ref 0.2–1.0)
pH, UA: 5 (ref 5.0–7.5)

## 2018-06-28 LAB — CBC WITH DIFFERENTIAL/PLATELET
Basophils Absolute: 0 10*3/uL (ref 0.0–0.2)
Basos: 1 %
EOS (ABSOLUTE): 0.1 10*3/uL (ref 0.0–0.4)
Eos: 1 %
Hematocrit: 37.2 % (ref 34.0–46.6)
Hemoglobin: 12.4 g/dL (ref 11.1–15.9)
IMMATURE GRANULOCYTES: 0 %
Immature Grans (Abs): 0 10*3/uL (ref 0.0–0.1)
LYMPHS: 31 %
Lymphocytes Absolute: 2.2 10*3/uL (ref 0.7–3.1)
MCH: 28.7 pg (ref 26.6–33.0)
MCHC: 33.3 g/dL (ref 31.5–35.7)
MCV: 86 fL (ref 79–97)
Monocytes Absolute: 0.5 10*3/uL (ref 0.1–0.9)
Monocytes: 8 %
NEUTROS PCT: 59 %
Neutrophils Absolute: 4.2 10*3/uL (ref 1.4–7.0)
PLATELETS: 298 10*3/uL (ref 150–450)
RBC: 4.32 x10E6/uL (ref 3.77–5.28)
RDW: 12.9 % (ref 11.7–15.4)
WBC: 7.1 10*3/uL (ref 3.4–10.8)

## 2018-06-28 LAB — COMPREHENSIVE METABOLIC PANEL
ALT: 11 IU/L (ref 0–32)
AST: 15 IU/L (ref 0–40)
Albumin/Globulin Ratio: 2.4 — ABNORMAL HIGH (ref 1.2–2.2)
Albumin: 4.5 g/dL (ref 3.8–4.8)
Alkaline Phosphatase: 44 IU/L (ref 39–117)
BUN/Creatinine Ratio: 18 (ref 9–23)
BUN: 14 mg/dL (ref 6–24)
Bilirubin Total: 0.4 mg/dL (ref 0.0–1.2)
CALCIUM: 9.2 mg/dL (ref 8.7–10.2)
CO2: 23 mmol/L (ref 20–29)
Chloride: 103 mmol/L (ref 96–106)
Creatinine, Ser: 0.8 mg/dL (ref 0.57–1.00)
GFR calc Af Amer: 102 mL/min/{1.73_m2} (ref >59)
GFR calc non Af Amer: 89 mL/min/{1.73_m2} (ref >59)
Globulin, Total: 1.9 g/dL (ref 1.5–4.5)
Glucose: 94 mg/dL (ref 65–99)
Potassium: 4.5 mmol/L (ref 3.5–5.2)
Sodium: 141 mmol/L (ref 134–144)
Total Protein: 6.4 g/dL (ref 6.0–8.5)

## 2018-06-28 LAB — LIPID PANEL W/O CHOL/HDL RATIO
Cholesterol, Total: 173 mg/dL (ref 100–199)
HDL: 64 mg/dL (ref >39)
LDL CALC: 92 mg/dL (ref 0–99)
Triglycerides: 86 mg/dL (ref 0–149)
VLDL Cholesterol Cal: 17 mg/dL (ref 5–40)

## 2018-06-28 LAB — TSH: TSH: 2.5 u[IU]/mL (ref 0.450–4.500)

## 2018-06-28 LAB — VITAMIN D 25 HYDROXY (VIT D DEFICIENCY, FRACTURES): Vit D, 25-Hydroxy: 28.4 ng/mL — ABNORMAL LOW (ref 30.0–100.0)

## 2018-06-28 LAB — BAYER DCA HB A1C WAIVED: HB A1C (BAYER DCA - WAIVED): 5.5 % (ref <7.0)

## 2018-06-28 LAB — HIV ANTIBODY (ROUTINE TESTING W REFLEX): HIV Screen 4th Generation wRfx: NONREACTIVE

## 2018-08-16 ENCOUNTER — Other Ambulatory Visit: Payer: Self-pay | Admitting: Family Medicine

## 2018-08-16 NOTE — Telephone Encounter (Signed)
Called pharmacy and no refills are left on this medication Requested Prescriptions  Pending Prescriptions Disp Refills  . VENTOLIN HFA 108 (90 Base) MCG/ACT inhaler [Pharmacy Med Name: VENTOLIN HFA 90 MCG INHALER] 18 Inhaler 0    Sig: TAKE 2 PUFFS BY MOUTH EVERY 6 HOURS AS NEEDED FOR WHEEZE OR SHORTNESS OF BREATH *NOT COVERED*     Pulmonology:  [facility] Failed - 08/16/2018 10:17 AM      Failed - One inhaler should last at least one month. If the patient is requesting refills earlier, contact the patient to check for uncontrolled symptoms.      Passed - Valid encounter within last 12 months    Recent Outpatient Visits          1 month ago Routine general medical examination at a health care facility   [facility], [place] P, DO   3 months ago SOB (shortness of breath)   [place], Megan P, DO   4 months ago Chronic fatigue   [place], [place], DO   7 months ago High cholesterol   [facility] [place], [place], DO   11 months ago Allergy to insect bites   [place], Barb Merino, DO      Future Appointments            In 4 months Wynetta Emery, Barb Merino, DO [employer]

## 2018-08-18 ENCOUNTER — Other Ambulatory Visit: Payer: Self-pay | Admitting: Family Medicine

## 2018-11-21 ENCOUNTER — Other Ambulatory Visit: Payer: Self-pay | Admitting: Family Medicine

## 2018-12-20 ENCOUNTER — Other Ambulatory Visit: Payer: Self-pay | Admitting: Family Medicine

## 2018-12-24 ENCOUNTER — Other Ambulatory Visit: Payer: Self-pay | Admitting: Family Medicine

## 2018-12-26 ENCOUNTER — Ambulatory Visit: Payer: BLUE CROSS/BLUE SHIELD | Admitting: Family Medicine

## 2019-01-13 ENCOUNTER — Ambulatory Visit (INDEPENDENT_AMBULATORY_CARE_PROVIDER_SITE_OTHER): Payer: BLUE CROSS/BLUE SHIELD | Admitting: Family Medicine

## 2019-01-13 ENCOUNTER — Encounter: Payer: Self-pay | Admitting: Family Medicine

## 2019-01-13 ENCOUNTER — Other Ambulatory Visit: Payer: Self-pay

## 2019-01-13 VITALS — BP 119/81 | HR 68 | Temp 97.9°F | Ht 63.0 in | Wt 158.0 lb

## 2019-01-13 DIAGNOSIS — E7800 Pure hypercholesterolemia, unspecified: Secondary | ICD-10-CM

## 2019-01-13 DIAGNOSIS — E559 Vitamin D deficiency, unspecified: Secondary | ICD-10-CM

## 2019-01-13 DIAGNOSIS — J4520 Mild intermittent asthma, uncomplicated: Secondary | ICD-10-CM | POA: Diagnosis not present

## 2019-01-13 DIAGNOSIS — J45909 Unspecified asthma, uncomplicated: Secondary | ICD-10-CM | POA: Insufficient documentation

## 2019-01-13 DIAGNOSIS — E663 Overweight: Secondary | ICD-10-CM

## 2019-01-13 DIAGNOSIS — R7301 Impaired fasting glucose: Secondary | ICD-10-CM | POA: Diagnosis not present

## 2019-01-13 LAB — BAYER DCA HB A1C WAIVED: HB A1C (BAYER DCA - WAIVED): 5.8 % (ref <7.0)

## 2019-01-13 MED ORDER — PRAVASTATIN SODIUM 20 MG PO TABS: ORAL_TABLET | ORAL | 1 refills | Status: DC

## 2019-01-13 MED ORDER — PANTOPRAZOLE SODIUM 40 MG PO TBEC: 40.0000 mg | DELAYED_RELEASE_TABLET | Freq: Two times a day (BID) | ORAL | 1 refills | Status: DC

## 2019-01-13 MED ORDER — ALBUTEROL SULFATE HFA 108 (90 BASE) MCG/ACT IN AERS: INHALATION_SPRAY | RESPIRATORY_TRACT | 6 refills | Status: DC

## 2019-01-13 MED ORDER — BREO ELLIPTA 100-25 MCG/INH IN AEPB: 1.0000 | INHALATION_SPRAY | Freq: Every day | RESPIRATORY_TRACT | 0 refills | Status: DC

## 2019-01-13 MED ORDER — NAPROXEN 500 MG PO TABS: 500.0000 mg | ORAL_TABLET | Freq: Two times a day (BID) | ORAL | 1 refills | Status: DC

## 2019-01-13 NOTE — Assessment & Plan Note (Signed)
Rechecking levels today. Await results. Call with any concerns.  

## 2019-01-13 NOTE — Assessment & Plan Note (Signed)
Taking medicine very occasionally, triggered by allergies. Will start her on breo and recheck in a couple weeks to see how she's doing. Call with any concerns.

## 2019-01-13 NOTE — Progress Notes (Signed)
BP 119/81   Pulse 68   Temp 97.9 F (36.6 C) (Oral)   Ht 5' 3"  (1.6 m)   Wt 158 lb (71.7 kg)   SpO2 100%   BMI 27.99 kg/m    Subjective:    Patient ID: Judy Munoz, female    DOB: 08-30-1970, 48 y.o.   MRN: [id number]  HPI: Judy Munoz is a 48 y.o. female  Chief Complaint  Patient presents with  . IFG    14mf/u  . Hyperlipidemia   Continues with SOB occasionally. Better with inhaler. Worse before her period and with allergies.   Impaired Fasting Glucose HbA1C:  Lab Results  Component Value Date   HGBA1C 5.5 06/27/2018   Duration of elevated blood sugar: chronic Polydipsia: no Polyuria: no Weight change: yes Visual disturbance: no Glucose Monitoring: no Diabetic Education: Not Completed Family history of diabetes: yes  HYPERLIPIDEMIA Hyperlipidemia status: excellent compliance Satisfied with current treatment?  yes Side effects:  no Medication compliance: excellent compliance Past cholesterol meds: pravastatin Supplements: none Aspirin:  no The 10-year ASCVD risk score (Mikey BussingDC Jr., et al., 2013) is: 0.5%   Values used to calculate the score:     Age: [age]years     Sex: Female     Is Non-Hispanic African American: No     Diabetic: No     Tobacco smoker: No     Systolic Blood Pressure: 1440mmHg     Is BP treated: No     HDL Cholesterol: 64 mg/dL     Total Cholesterol: 173 mg/dL Chest pain:  no Coronary artery disease:  no  Relevant past medical, surgical, family and social history reviewed and updated as indicated. Interim medical history since our last visit reviewed. Allergies and medications reviewed and updated.  Review of Systems  Constitutional: Negative.   HENT: Negative.   Respiratory: Positive for chest tightness and shortness of breath. Negative for apnea, cough, choking, wheezing and stridor.   Cardiovascular: Negative.   Gastrointestinal: Negative.   Psychiatric/Behavioral: Negative.     Per HPI unless specifically  indicated above     Objective:    BP 119/81   Pulse 68   Temp 97.9 F (36.6 C) (Oral)   Ht 5' 3"  (1.6 m)   Wt 158 lb (71.7 kg)   SpO2 100%   BMI 27.99 kg/m   Wt Readings from Last 3 Encounters:  01/13/19 158 lb (71.7 kg)  06/27/18 150 lb 9 oz (68.3 kg)  04/30/18 141 lb (64 kg)    Physical Exam Vitals signs and nursing note reviewed.  Constitutional:      General: She is not in acute distress.    Appearance: Normal appearance. She is not ill-appearing, toxic-appearing or diaphoretic.  HENT:     Head: Normocephalic and atraumatic.     Right Ear: External ear normal.     Left Ear: External ear normal.     Nose: Nose normal.     Mouth/Throat:     Mouth: Mucous membranes are moist.     Pharynx: Oropharynx is clear.  Eyes:     General: No scleral icterus.       Right eye: No discharge.        Left eye: No discharge.     Extraocular Movements: Extraocular movements intact.     Conjunctiva/sclera: Conjunctivae normal.     Pupils: Pupils are equal, round, and reactive to light.  Neck:     Musculoskeletal: Normal range of  motion and neck supple.  Cardiovascular:     Rate and Rhythm: Normal rate and regular rhythm.     Pulses: Normal pulses.     Heart sounds: Normal heart sounds. No murmur. No friction rub. No gallop.   Pulmonary:     Effort: Pulmonary effort is normal. No respiratory distress.     Breath sounds: Normal breath sounds. No stridor. No wheezing, rhonchi or rales.  Chest:     Chest wall: No tenderness.  Musculoskeletal: Normal range of motion.  Skin:    General: Skin is warm and dry.     Capillary Refill: Capillary refill takes less than 2 seconds.     Coloration: Skin is not jaundiced or pale.     Findings: No bruising, erythema, lesion or rash.  Neurological:     General: No focal deficit present.     Mental Status: She is alert and oriented to person, place, and time. Mental status is at baseline.  Psychiatric:        Mood and Affect: Mood normal.         Behavior: Behavior normal.        Thought Content: Thought content normal.        Judgment: Judgment normal.     Results for orders placed or performed in visit on 06/27/18  Microscopic Examination   URINE  Result Value Ref Range   WBC, UA None seen 0 - 5 /hpf   RBC, UA 0-2 0 - 2 /hpf   Epithelial Cells (non renal) 0-10 0 - 10 /hpf   Bacteria, UA None seen None seen/Few  CBC with Differential/Platelet  Result Value Ref Range   WBC 7.1 3.4 - 10.8 x10E3/uL   RBC 4.32 3.77 - 5.28 x10E6/uL   Hemoglobin 12.4 11.1 - 15.9 g/dL   Hematocrit 37.2 34.0 - 46.6 %   MCV 86 79 - 97 fL   MCH 28.7 26.6 - 33.0 pg   MCHC 33.3 31.5 - 35.7 g/dL   RDW 12.9 11.7 - 15.4 %   Platelets 298 150 - 450 x10E3/uL   Neutrophils 59 Not Estab. %   Lymphs 31 Not Estab. %   Monocytes 8 Not Estab. %   Eos 1 Not Estab. %   Basos 1 Not Estab. %   Neutrophils Absolute 4.2 1.4 - 7.0 x10E3/uL   Lymphocytes Absolute 2.2 0.7 - 3.1 x10E3/uL   Monocytes Absolute 0.5 0.1 - 0.9 x10E3/uL   EOS (ABSOLUTE) 0.1 0.0 - 0.4 x10E3/uL   Basophils Absolute 0.0 0.0 - 0.2 x10E3/uL   Immature Granulocytes 0 Not Estab. %   Immature Grans (Abs) 0.0 0.0 - 0.1 x10E3/uL  Comprehensive metabolic panel  Result Value Ref Range   Glucose 94 65 - 99 mg/dL   BUN 14 6 - 24 mg/dL   Creatinine, Ser 0.80 0.57 - 1.00 mg/dL   GFR calc non Af Amer 89 >59 mL/min/1.73   GFR calc Af Amer 102 >59 mL/min/1.73   BUN/Creatinine Ratio 18 9 - 23   Sodium 141 134 - 144 mmol/L   Potassium 4.5 3.5 - 5.2 mmol/L   Chloride 103 96 - 106 mmol/L   CO2 23 20 - 29 mmol/L   Calcium 9.2 8.7 - 10.2 mg/dL   Total Protein 6.4 6.0 - 8.5 g/dL   Albumin 4.5 3.8 - 4.8 g/dL   Globulin, Total 1.9 1.5 - 4.5 g/dL   Albumin/Globulin Ratio 2.4 (H) 1.2 - 2.2   Bilirubin Total 0.4 0.0 - 1.2 mg/dL  Alkaline Phosphatase 44 39 - 117 IU/L   AST 15 0 - 40 IU/L   ALT 11 0 - 32 IU/L  Lipid Panel w/o Chol/HDL Ratio  Result Value Ref Range   Cholesterol, Total 173 100 -  199 mg/dL   Triglycerides 86 0 - 149 mg/dL   HDL 64 >39 mg/dL   VLDL Cholesterol Cal 17 5 - 40 mg/dL   LDL Calculated 92 0 - 99 mg/dL  TSH  Result Value Ref Range   TSH 2.500 0.450 - 4.500 uIU/mL  UA/M w/rflx Culture, Routine   Specimen: Urine   URINE  Result Value Ref Range   Specific Gravity, UA 1.025 1.005 - 1.030   pH, UA 5.0 5.0 - 7.5   Color, UA Yellow Yellow   Appearance Ur Clear Clear   Leukocytes, UA Negative Negative   Protein, UA Negative Negative/Trace   Glucose, UA Negative Negative   Ketones, UA Negative Negative   RBC, UA Trace (A) Negative   Bilirubin, UA Negative Negative   Urobilinogen, Ur 0.2 0.2 - 1.0 mg/dL   Nitrite, UA Negative Negative   Microscopic Examination See below:   HIV Antibody (routine testing w rflx)  Result Value Ref Range   HIV Screen 4th Generation wRfx Non Reactive Non Reactive  VITAMIN D 25 Hydroxy (Vit-D Deficiency, Fractures)  Result Value Ref Range   Vit D, 25-Hydroxy 28.4 (L) 30.0 - 100.0 ng/mL  Bayer DCA Hb A1c Waived  Result Value Ref Range   HB A1C (BAYER DCA - WAIVED) 5.5 <7.0 %      Assessment & Plan:   Problem List Items Addressed This Visit      Respiratory   Reactive airway disease - Primary    Taking medicine very occasionally, triggered by allergies. Will start her on breo and recheck in a couple weeks to see how she's doing. Call with any concerns.       Relevant Orders   Comprehensive metabolic panel     Endocrine   IFG (impaired fasting glucose)    Rechecking levels today. Await results. Call with any concerns.       Relevant Orders   Bayer DCA Hb A1c Waived   Comprehensive metabolic panel     Other   High cholesterol    Under good control on current regimen. Continue current regimen. Continue to monitor. Call with any concerns. Refills given. Labs drawn today.       Relevant Medications   pravastatin (PRAVACHOL) 20 MG tablet   Other Relevant Orders   Comprehensive metabolic panel   Lipid Panel  w/o Chol/HDL Ratio   Vitamin D deficiency    Rechecking levels today. Await results. Call with any concerns.       Relevant Orders   Comprehensive metabolic panel   VITAMIN D 25 Hydroxy (Vit-D Deficiency, Fractures)   Overweight (BMI 25.0-29.9)    Continue to work on diet and exercise with goal of losing 1-2lbs a week. Call with any concerns.       Relevant Orders   Comprehensive metabolic panel       Follow up plan: Return in about 6 months (around 07/13/2019) for Physical.

## 2019-01-13 NOTE — Assessment & Plan Note (Signed)
Continue to work on diet and exercise with goal of losing 1-2lbs a week. Call with any concerns.

## 2019-01-13 NOTE — Assessment & Plan Note (Signed)
Under good control on current regimen. Continue current regimen. Continue to monitor. Call with any concerns. Refills given. Labs drawn today.   

## 2019-01-14 LAB — COMPREHENSIVE METABOLIC PANEL WITH GFR
ALT: 14 IU/L (ref 0–32)
AST: 14 IU/L (ref 0–40)
Albumin/Globulin Ratio: 2.5 — ABNORMAL HIGH (ref 1.2–2.2)
Albumin: 4.7 g/dL (ref 3.8–4.8)
Alkaline Phosphatase: 48 IU/L (ref 39–117)
BUN/Creatinine Ratio: 17 (ref 9–23)
BUN: 15 mg/dL (ref 6–24)
Bilirubin Total: 0.3 mg/dL (ref 0.0–1.2)
CO2: 24 mmol/L (ref 20–29)
Calcium: 9.6 mg/dL (ref 8.7–10.2)
Chloride: 104 mmol/L (ref 96–106)
Creatinine, Ser: 0.88 mg/dL (ref 0.57–1.00)
GFR calc Af Amer: 90 mL/min/1.73 (ref >59)
GFR calc non Af Amer: 78 mL/min/1.73 (ref >59)
Globulin, Total: 1.9 g/dL (ref 1.5–4.5)
Glucose: 99 mg/dL (ref 65–99)
Potassium: 4.8 mmol/L (ref 3.5–5.2)
Sodium: 141 mmol/L (ref 134–144)
Total Protein: 6.6 g/dL (ref 6.0–8.5)

## 2019-01-14 LAB — LIPID PANEL W/O CHOL/HDL RATIO
Cholesterol, Total: 168 mg/dL (ref 100–199)
HDL: 62 mg/dL (ref >39)
LDL Chol Calc (NIH): 89 mg/dL (ref 0–99)
Triglycerides: 92 mg/dL (ref 0–149)
VLDL Cholesterol Cal: 17 mg/dL (ref 5–40)

## 2019-01-14 LAB — VITAMIN D 25 HYDROXY (VIT D DEFICIENCY, FRACTURES): Vit D, 25-Hydroxy: 34.2 ng/mL (ref 30.0–100.0)

## 2019-01-20 ENCOUNTER — Encounter: Payer: Self-pay | Admitting: Family Medicine

## 2019-01-20 MED ORDER — BREO ELLIPTA 100-25 MCG/INH IN AEPB: 1.0000 | INHALATION_SPRAY | Freq: Every day | RESPIRATORY_TRACT | 3 refills | Status: DC

## 2019-01-25 ENCOUNTER — Encounter: Payer: Self-pay | Admitting: Family Medicine

## 2019-01-25 MED ORDER — FLUTICASONE-SALMETEROL 250-50 MCG/DOSE IN AEPB: 1.0000 | INHALATION_SPRAY | Freq: Two times a day (BID) | RESPIRATORY_TRACT | 6 refills | Status: DC

## 2019-03-17 ENCOUNTER — Other Ambulatory Visit: Payer: Self-pay | Admitting: Family Medicine

## 2019-03-17 DIAGNOSIS — Z1231 Encounter for screening mammogram for malignant neoplasm of breast: Secondary | ICD-10-CM

## 2019-04-08 ENCOUNTER — Other Ambulatory Visit: Payer: Self-pay

## 2019-04-08 ENCOUNTER — Ambulatory Visit (INDEPENDENT_AMBULATORY_CARE_PROVIDER_SITE_OTHER): Payer: BLUE CROSS/BLUE SHIELD | Admitting: Obstetrics and Gynecology

## 2019-04-08 ENCOUNTER — Encounter: Payer: Self-pay | Admitting: Obstetrics and Gynecology

## 2019-04-08 VITALS — BP 125/86 | HR 73 | Ht 63.0 in | Wt 159.0 lb

## 2019-04-08 DIAGNOSIS — N924 Excessive bleeding in the premenopausal period: Secondary | ICD-10-CM | POA: Diagnosis not present

## 2019-04-08 DIAGNOSIS — Z1231 Encounter for screening mammogram for malignant neoplasm of breast: Secondary | ICD-10-CM | POA: Diagnosis not present

## 2019-04-08 DIAGNOSIS — Z01419 Encounter for gynecological examination (general) (routine) without abnormal findings: Secondary | ICD-10-CM

## 2019-04-08 DIAGNOSIS — E785 Hyperlipidemia, unspecified: Secondary | ICD-10-CM

## 2019-04-08 NOTE — Patient Instructions (Addendum)
Preventive Care 87-47 Years Old, Female Preventive care refers to visits with your health care provider and lifestyle choices that can promote health and wellness. This includes:  A yearly physical exam. This may also be called an annual well check.  Regular dental visits and eye exams.  Immunizations.  Screening for certain conditions.  Healthy lifestyle choices, such as eating a healthy diet, getting regular exercise, not using drugs or products that contain nicotine and tobacco, and limiting alcohol use. What can I expect for my preventive care visit? Physical exam Your health care provider will check your:  Height and weight. This may be used to calculate body mass index (BMI), which tells if you are at a healthy weight.  Heart rate and blood pressure.  Skin for abnormal spots. Counseling Your health care provider may ask you questions about your:  Alcohol, tobacco, and drug use.  Emotional well-being.  Home and relationship well-being.  Sexual activity.  Eating habits.  Work and work Statistician.  Method of birth control.  Menstrual cycle.  Pregnancy history. What immunizations do I need?  Influenza (flu) vaccine  This is recommended every year. Tetanus, diphtheria, and pertussis (Tdap) vaccine  You may need a Td booster every 10 years. Varicella (chickenpox) vaccine  You may need this if you have not been vaccinated. Zoster (shingles) vaccine  You may need this after age 52. Measles, mumps, and rubella (MMR) vaccine  You may need at least one dose of MMR if you were born in 1957 or later. You may also need a second dose. Pneumococcal conjugate (PCV13) vaccine  You may need this if you have certain conditions and were not previously vaccinated. Pneumococcal polysaccharide (PPSV23) vaccine  You may need one or two doses if you smoke cigarettes or if you have certain conditions. Meningococcal conjugate (MenACWY) vaccine  You may need this if you  have certain conditions. Hepatitis A vaccine  You may need this if you have certain conditions or if you travel or work in places where you may be exposed to hepatitis A. Hepatitis B vaccine  You may need this if you have certain conditions or if you travel or work in places where you may be exposed to hepatitis B. Haemophilus influenzae type b (Hib) vaccine  You may need this if you have certain conditions. Human papillomavirus (HPV) vaccine  If recommended by your health care provider, you may need three doses over 6 months. You may receive vaccines as individual doses or as more than one vaccine together in one shot (combination vaccines). Talk with your health care provider about the risks and benefits of combination vaccines. What tests do I need? Blood tests  Lipid and cholesterol levels. These may be checked every 5 years, or more frequently if you are over 8 years old.  Hepatitis C test.  Hepatitis B test. Screening  Lung cancer screening. You may have this screening every year starting at age 50 if you have a 30-pack-year history of smoking and currently smoke or have quit within the past 15 years.  Colorectal cancer screening. All adults should have this screening starting at age 64 and continuing until age 8. Your health care provider may recommend screening at age 77 if you are at increased risk. You will have tests every 1-10 years, depending on your results and the type of screening test.  Diabetes screening. This is done by checking your blood sugar (glucose) after you have not eaten for a while (fasting). You may have this  done every 1-3 years.  Mammogram. This may be done every 1-2 years. Talk with your health care provider about when you should start having regular mammograms. This may depend on whether you have a family history of breast cancer.  BRCA-related cancer screening. This may be done if you have a family history of breast, ovarian, tubal, or peritoneal  cancers.  Pelvic exam and Pap test. This may be done every 3 years starting at age 41. Starting at age 56, this may be done every 5 years if you have a Pap test in combination with an HPV test. Other tests  Sexually transmitted disease (STD) testing.  Bone density scan. This is done to screen for osteoporosis. You may have this scan if you are at high risk for osteoporosis. Follow these instructions at home: Eating and drinking  Eat a diet that includes fresh fruits and vegetables, whole grains, lean protein, and low-fat dairy.  Take vitamin and mineral supplements as recommended by your health care provider.  Do not drink alcohol if: ? Your health care provider tells you not to drink. ? You are pregnant, may be pregnant, or are planning to become pregnant.  If you drink alcohol: ? Limit how much you have to 0-1 drink a day. ? Be aware of how much alcohol is in your drink. In the [place] one drink equals one 12 oz bottle of beer (355 mL), one 5 oz glass of wine (148 mL), or one 1 oz glass of hard liquor (44 mL). Lifestyle  Take daily care of your teeth and gums.  Stay active. Exercise for at least 30 minutes on 5 or more days each week.  Do not use any products that contain nicotine or tobacco, such as cigarettes, e-cigarettes, and chewing tobacco. If you need help quitting, ask your health care provider.  If you are sexually active, practice safe sex. Use a condom or other form of birth control (contraception) in order to prevent pregnancy and STIs (sexually transmitted infections).  If told by your health care provider, take low-dose aspirin daily starting at age 62. What's next?  Visit your health care provider once a year for a well check visit.  Ask your health care provider how often you should have your eyes and teeth checked.  Stay up to date on all vaccines. This information is not intended to replace advice given to you by your health care provider. Make sure you  discuss any questions you have with your health care provider. Document Released: 05/14/2015 Document Revised: 12/27/2017 Document Reviewed: 12/27/2017 Elsevier Patient Education  2020 [place] self-awareness is knowing how your breasts look and feel. Doing breast self-awareness is important. It allows you to catch a breast problem early while it is still small and can be treated. All women should do breast self-awareness, including women who have had breast implants. Tell your doctor if you notice a change in your breasts. What you need:  A mirror.  A well-lit room. How to do a breast self-exam A breast self-exam is one way to learn what is normal for your breasts and to check for changes. To do a breast self-exam: Look for changes  1. Take off all the clothes above your waist. 2. Stand in front of a mirror in a room with good lighting. 3. Put your hands on your hips. 4. Push your hands down. 5. Look at your breasts and nipples in the mirror to see if one breast or nipple  different from the other. Check to see if: ? The shape of one breast is different. ? The size of one breast is different. ? There are wrinkles, dips, and bumps in one breast and not the other. 6. Look at each breast for changes in the skin, such as: ? Redness. ? Scaly areas. 7. Look for changes in your nipples, such as: ? Liquid around the nipples. ? Bleeding. ? Dimpling. ? Redness. ? A change in where the nipples are. Feel for changes  1. Lie on your back on the floor. 2. Feel each breast. To do this, follow these steps: ? Pick a breast to feel. ? Put the arm closest to that breast above your head. ? Use your other arm to feel the nipple area of your breast. Feel the area with the pads of your three middle fingers by making small circles with your fingers. For the first circle, press lightly. For the second circle, press harder. For the third circle, press even harder.  ? Keep making circles with your fingers at the different pressures as you move down your breast. Stop when you feel your ribs. ? Move your fingers a little toward the center of your body. ? Start making circles with your fingers again, this time going up until you reach your collarbone. ? Keep making up-and-down circles until you reach your armpit. Remember to keep using the three pressures. ? Feel the other breast in the same way. 3. Sit or stand in the tub or shower. 4. With soapy water on your skin, feel each breast the same way you did in step 2 when you were lying on the floor. Write down what you find Writing down what you find can help you remember what to tell your doctor. Write down:  What is normal for each breast.  Any changes you find in each breast, including: ? The kind of changes you find. ? Whether you have pain. ? Size and location of any lumps.  When you last had your menstrual period. General tips  Check your breasts every month.  If you are breastfeeding, the best time to check your breasts is after you feed your baby or after you use a breast pump.  If you get menstrual periods, the best time to check your breasts is 5-7 days after your menstrual period is over.  With time, you will become comfortable with the self-exam, and you will begin to know if there are changes in your breasts. Contact a doctor if you:  See a change in the shape or size of your breasts or nipples.  See a change in the skin of your breast or nipples, such as red or scaly skin.  Have fluid coming from your nipples that is not normal.  Find a lump or thick area that was not there before.  Have pain in your breasts.  Have any concerns about your breast health. Summary  Breast self-awareness includes looking for changes in your breasts, as well as feeling for changes within your breasts.  Breast self-awareness should be done in front of a mirror in a well-lit room.  You should  check your breasts every month. If you get menstrual periods, the best time to check your breasts is 5-7 days after your menstrual period is over.  Let your doctor know of any changes you see in your breasts, including changes in size, changes on the skin, pain or tenderness, or fluid from your nipples that is not normal. This   This information is not intended to replace advice given to you by your health care provider. Make sure you discuss any questions you have with your health care provider. Document Released: 10/04/2007 Document Revised: 12/04/2017 Document Reviewed: 12/04/2017 Elsevier Patient Education  2020 [place] is the normal time of life before and after menstrual periods stop completely (menopause). Perimenopause can begin 2-8 years before menopause, and it usually lasts for 1 year after menopause. During perimenopause, the ovaries may or may not produce an egg. What are the causes? This condition is caused by a natural change in hormone levels that happens as you get older. What increases the risk? This condition is more likely to start at an earlier age if you have certain medical conditions or treatments, including:  A tumor of the pituitary gland in the brain.  A disease that affects the ovaries and hormone production.  Radiation treatment for cancer.  Certain cancer treatments, such as chemotherapy or hormone (anti-estrogen) therapy.  Heavy smoking and excessive alcohol use.  Family history of early menopause. What are the signs or symptoms? Perimenopausal changes affect each woman differently. Symptoms of this condition may include:  Hot flashes.  Night sweats.  Irregular menstrual periods.  Decreased sex drive.  Vaginal dryness.  Headaches.  Mood swings.  Depression.  Memory problems or trouble concentrating.  Irritability.  Tiredness.  Weight gain.  Anxiety.  Trouble getting pregnant. How is this  diagnosed? This condition is diagnosed based on your medical history, a physical exam, your age, your menstrual history, and your symptoms. Hormone tests may also be done. How is this treated? In some cases, no treatment is needed. You and your health care provider should make a decision together about whether treatment is necessary. Treatment will be based on your individual condition and preferences. Various treatments are available, such as:  Menopausal hormone therapy (MHT).  Medicines to treat specific symptoms.  Acupuncture.  Vitamin or herbal supplements. Before starting treatment, make sure to let your health care provider know if you have a personal or family history of:  Heart disease.  Breast cancer.  Blood clots.  Diabetes.  Osteoporosis. Follow these instructions at home: Lifestyle  Do not use any products that contain nicotine or tobacco, such as cigarettes and e-cigarettes. If you need help quitting, ask your health care provider.  Eat a balanced diet that includes fresh fruits and vegetables, whole grains, soybeans, eggs, lean meat, and low-fat dairy.  Get at least 30 minutes of physical activity on 5 or more days each week.  Avoid alcoholic and caffeinated beverages, as well as spicy foods. This may help prevent hot flashes.  Get 7-8 hours of sleep each night.  Dress in layers that can be removed to help you manage hot flashes.  Find ways to manage stress, such as deep breathing, meditation, or journaling. General instructions  Keep track of your menstrual periods, including: ? When they occur. ? How heavy they are and how long they last. ? How much time passes between periods.  Keep track of your symptoms, noting when they start, how often you have them, and how long they last.  Take over-the-counter and prescription medicines only as told by your health care provider.  Take vitamin supplements only as told by your health care provider. These may  include calcium, vitamin E, and vitamin D.  Use vaginal lubricants or moisturizers to help with vaginal dryness and improve comfort during sex.  Talk with your  health care provider before starting any herbal supplements.  Keep all follow-up visits as told by your health care provider. This is important. This includes any group therapy or counseling. Contact a health care provider if:  You have heavy vaginal bleeding or pass blood clots.  Your period lasts more than 2 days longer than normal.  Your periods are recurring sooner than 21 days.  You bleed after having sex. Get help right away if:  You have chest pain, trouble breathing, or trouble talking.  You have severe depression.  You have pain when you urinate.  You have severe headaches.  You have vision problems. Summary  Perimenopause is the time when a woman's body begins to move into menopause. This may happen naturally or as a result of other health problems or medical treatments.  Perimenopause can begin 2-8 years before menopause, and it usually lasts for 1 year after menopause.  Perimenopausal symptoms can be managed through medicines, lifestyle changes, and complementary therapies such as acupuncture. This information is not intended to replace advice given to you by your health care provider. Make sure you discuss any questions you have with your health care provider. Document Released: 05/25/2004 Document Revised: 03/30/2017 Document Reviewed: 05/23/2016 Elsevier Patient Education  2020 [employer].

## 2019-04-08 NOTE — Progress Notes (Signed)
Pt is present for annual exam. Pt stated having painful menstrual cycles. Pt stated that she has noticed a lot of changes in her cycle lately and would like to know about her options. Flu vaccine completed 02/12/19.

## 2019-04-08 NOTE — Progress Notes (Signed)
GYNECOLOGY ANNUAL PHYSICAL EXAM PROGRESS NOTE  Subjective:    Judy Munoz is a 48 y.o. G51P1001 married female who presents for an annual exam.  The patient is sexually active.  The patient wears seatbelts: yes. The patient participates in regular exercise: notes walking occasionally. Has the patient ever been transfused or tattooed?: no. The patient reports that there is not domestic violence in her life.    The patient has the following complaints today:  1.  Notes periods have continued to be heavier since last year. Cycles may come every 19-25 days., lasting 4-5 days.  Passing large clots. Still also having occasional hot flushes. Workup last year noted small fibroids and possible adenomyosis on ultrasound, with normal endometrial biopsy.    Gynecologic History Menarche age: 63 Patient's last menstrual period was 04/03/2019. Contraception: none History of STI's: Denies Last Pap: 03/2017. Results were: normal.  Denies h/o abnormal pap smears. Last mammogram: 03/2018. Results were: Normal. BIRADS 1.    OB History  Gravida Para Term Preterm AB Living  1 1 1  0 0 1  SAB TAB Ectopic Multiple Live Births  0 0 0 0 1    # Outcome Date GA Lbr Len/2nd Weight Sex Delivery Anes PTL Lv  1 Term 2014 [redacted]w[redacted]d 5 lb 12 oz (2.608 kg) F Vag-Spont  N LIV    Past Medical History:  Diagnosis Date  . Anemia    H/O WITH PREGNANCY  . Asthma   . Biliary dyskinesia   . Elevated glucose   . GERD (gastroesophageal reflux disease)   . Headache    MIGRAINES RARE  . Heart murmur    WAS TOLD THIS ONCE YEARS AGO-HAS NEVER BEEN TOLD SINCE  . High cholesterol   . History of uterine fibroid   . IFG (impaired fasting glucose)   . PONV (postoperative nausea and vomiting)    HAPPENED WITH HER 1ST EGD- HER 2ND EGD SHE WAS GIVEN ANTI-EMETIC IV AND HAD NO N/V  . Vitamin D deficiency     Past Surgical History:  Procedure Laterality Date  . CHOLECYSTECTOMY N/A 07/18/2016   Procedure: LAPAROSCOPIC  CHOLECYSTECTOMY;  Surgeon: DJules Husbands MD;  Location: [facility];  Service: General;  Laterality: N/A;  . CHOLECYSTECTOMY, LAPAROSCOPIC  07/18/2016  . ESOPHAGOGASTRODUODENOSCOPY    . ESOPHAGOGASTRODUODENOSCOPY (EGD) WITH PROPOFOL N/A 07/26/2015   Procedure: ESOPHAGOGASTRODUODENOSCOPY (EGD) WITH PROPOFOL;  Surgeon: DLucilla Lame MD;  [facility]  Service: Endoscopy;  Laterality: N/A;    Family History  Problem Relation Age of Onset  . Diabetes Father   . Heart disease Father   . Hypertension Father   . Hyperlipidemia Father   . Thyroid disease Mother   . Cancer Maternal Grandmother        uterine  . Breast cancer Maternal Grandmother 560 . Thyroid disease Maternal Grandmother   . Lung cancer Maternal Uncle     Social History   Socioeconomic History  . Marital status: Married    Spouse name: Not on file  . Number of children: Not on file  . Years of education: Not on file  . Highest education level: Not on file  Occupational History  . Not on file  Social Needs  . Financial resource strain: Not on file  . Food insecurity    Worry: Not on file    Inability: Not on file  . Transportation needs    Medical: Not on file    Non-medical: Not on file  Tobacco Use  . Smoking status: Never Smoker  . Smokeless tobacco: Never Used  Substance and Sexual Activity  . Alcohol use: No  . Drug use: No  . Sexual activity: Yes    Birth control/protection: None  Lifestyle  . Physical activity    Days per week: 7 days    Minutes per session: 60 min  . Stress: Only a little  Relationships  . Social Herbalist on phone: Patient refused    Gets together: Patient refused    Attends religious service: Patient refused    Active member of club or organization: Patient refused    Attends meetings of clubs or organizations: Patient refused    Relationship status: Patient refused  . Intimate partner violence    Fear of current or ex partner: Patient refused     Emotionally abused: Patient refused    Physically abused: Patient refused    Forced sexual activity: Patient refused  Other Topics Concern  . Not on file  Social History Narrative  . Not on file    Current Outpatient Medications on File Prior to Visit  Medication Sig Dispense Refill  . albuterol (VENTOLIN HFA) 108 (90 Base) MCG/ACT inhaler TAKE 2 PUFFS BY MOUTH EVERY 6 HOURS AS NEEDED FOR WHEEZE OR SHORTNESS OF BREATH 18 g 6  . cetirizine (ZYRTEC) 10 MG tablet Take 10 mg by mouth daily.    . Cholecalciferol (VITAMIN D-400 PO) Take 800 mg by mouth.     . fluticasone (FLONASE) 50 MCG/ACT nasal spray Place into the nose.    . fluticasone furoate-vilanterol (BREO ELLIPTA) 100-25 MCG/INH AEPB Inhale 1 puff into the lungs daily. 60 each 3  . naproxen (NAPROSYN) 500 MG tablet Take 1 tablet (500 mg total) by mouth 2 (two) times daily with a meal. (Patient taking differently: Take 500 mg by mouth 2 (two) times daily with a meal. As needed.) 180 tablet 1  . pantoprazole (PROTONIX) 40 MG tablet Take 1 tablet (40 mg total) by mouth 2 (two) times daily. 180 tablet 1  . pravastatin (PRAVACHOL) 20 MG tablet TAKE 1 TABLET BY MOUTH EVERYDAY AT BEDTIME 90 tablet 1   No current facility-administered medications on file prior to visit.     Allergies  Allergen Reactions  . Sulfa Antibiotics Hives    Review of Systems Constitutional: negative for chills, fevers and sweats.  Eyes: negative for irritation, redness and visual disturbance Ears, nose, mouth, throat, and face: negative for hearing loss, nasal congestion, snoring and tinnitus Respiratory: negative for asthma, cough, sputum Cardiovascular: negative for chest pain, dyspnea, exertional chest pressure/discomfort, irregular heart beat, palpitations and syncope Gastrointestinal: negative for abdominal pain, change in bowel habits, nausea and vomiting Genitourinary: positive for changes in menstrual periods (see HPI).  Negative for genital lesions,  sexual problems and vaginal discharge, dysuria and urinary incontinence Integument/breast: negative for breast lump, breast tenderness and nipple discharge Hematologic/lymphatic: negative for bleeding and easy bruising Musculoskeletal:  Negative for muscle weakness or back pain Neurological: negative for dizziness, headaches, vertigo and weakness Endocrine: negative for diabetic symptoms including polydipsia, polyuria and skin dryness Allergic/Immunologic: negative for hay fever and urticaria         Objective:  Blood pressure 125/86, pulse 73, height 5' 3"  (1.6 m), weight 159 lb (72.1 kg), last menstrual period 04/03/2019. Body mass index is 28.17 kg/m.  General Appearance:    Alert, cooperative, no distress, appears stated age, overweight  Head:    Normocephalic, without obvious abnormality,  atraumatic  Eyes:    PERRL, conjunctiva/corneas clear, EOM's intact, both eyes  Ears:    Normal external ear canals, both ears  Nose:   Nares normal, septum midline, mucosa normal, no drainage or sinus tenderness  Throat:   Lips, mucosa, and tongue normal; teeth and gums normal  Neck:   Supple, symmetrical, trachea midline, no adenopathy; thyroid: no enlargement/tenderness/nodules; no carotid bruit or JVD  Back:     Symmetric, no curvature, ROM normal, no CVA tenderness  Lungs:     Clear to auscultation bilaterally, respirations unlabored  Chest Wall:    No tenderness or deformity   Heart:    Regular rate and rhythm, S1 and S2 normal, no murmur, rub or gallop  Breast Exam:    No tenderness, masses, or nipple abnormality  Abdomen:     Soft, non-tender, bowel sounds active all four quadrants, no masses, no organomegaly.    Genitalia:    Pelvic:external genitalia normal, vagina without lesions, discharge, or tenderness, rectovaginal septum  normal. Cervix normal in appearance, no cervical motion tenderness, no adnexal masses or tenderness.  Uterus 10-12 week size, shape, mobile, regular contours,  nontender.  Rectal:    Normal external sphincter.  No hemorrhoids appreciated. Internal exam not done.   Extremities:   Extremities normal, atraumatic, no cyanosis or edema  Pulses:   2+ and symmetric all extremities  Skin:   Skin color, texture, turgor normal, no rashes or lesions  Lymph nodes:   Cervical, supraclavicular, and axillary nodes normal  Neurologic:   CNII-XII intact, normal strength, sensation and reflexes throughout   .  Labs:  Lab Results  Component Value Date   WBC 7.1 06/27/2018   HGB 12.4 06/27/2018   HCT 37.2 06/27/2018   MCV 86 06/27/2018   PLT 298 06/27/2018    Lab Results  Component Value Date   CREATININE 0.88 01/13/2019   BUN 15 01/13/2019   NA 141 01/13/2019   K 4.8 01/13/2019   CL 104 01/13/2019   CO2 24 01/13/2019    Lab Results  Component Value Date   ALT 14 01/13/2019   AST 14 01/13/2019   ALKPHOS 48 01/13/2019   BILITOT 0.3 01/13/2019    Lab Results  Component Value Date   TSH 2.500 06/27/2018    Lab Results  Component Value Date   CHOL 168 01/13/2019   HDL 62 01/13/2019   LDLCALC 89 01/13/2019   TRIG 92 01/13/2019   CHOLHDL 3.9 03/02/2015    Lab Results  Component Value Date   HGBA1C 5.8 01/13/2019      Assessment:   Healthy female exam.  Perimenopausal abnormal bleeding Fibroid uterus Dyslipidemia  Plan:    - Blood tests: Up to date.  - Breast self exam technique reviewed and patient encouraged to perform self-exam monthly. - Contraception: none.  - Discussed healthy lifestyle modifications. - Mammogram ordered.   - Discussed perimenopausal symptoms again (mood changes, shortening of cycles with heavier bleeding).  Patient also with h/o small fibroids, previously asymptomatic. Uterine size stil wnl. Discussed again treatment options with patient, including menopausal HRT doee range, OCPs, Depo Provera, Mirena IUD; or surgical management with endometrial ablation or hysterectomy.  Patient notes she would like to  consider the Mirena IUD. Given handout.  Patient to f/u in 1-2 weeks for IUD placement if desired.  - Flu vaccine up to date.  - RTC in 1 year, or sooner if symptoms persist.      Rubie Maid, MD [facility]

## 2019-04-22 ENCOUNTER — Ambulatory Visit: Payer: BLUE CROSS/BLUE SHIELD | Admitting: Obstetrics and Gynecology

## 2019-05-07 ENCOUNTER — Ambulatory Visit
Admission: RE | Admit: 2019-05-07 | Discharge: 2019-05-07 | Disposition: A | Discharge status: Home / Self Care | Payer: 59 | Source: Ambulatory Visit | Attending: Family Medicine | Admitting: Family Medicine

## 2019-05-07 DIAGNOSIS — Z1231 Encounter for screening mammogram for malignant neoplasm of breast: Secondary | ICD-10-CM | POA: Insufficient documentation

## 2019-05-08 ENCOUNTER — Ambulatory Visit (INDEPENDENT_AMBULATORY_CARE_PROVIDER_SITE_OTHER): Payer: 59 | Admitting: Obstetrics and Gynecology

## 2019-05-08 ENCOUNTER — Other Ambulatory Visit: Payer: Self-pay

## 2019-05-08 ENCOUNTER — Encounter: Payer: Self-pay | Admitting: Obstetrics and Gynecology

## 2019-05-08 VITALS — BP 120/75 | HR 75 | Ht 63.0 in | Wt 159.2 lb

## 2019-05-08 DIAGNOSIS — Z30430 Encounter for insertion of intrauterine contraceptive device: Secondary | ICD-10-CM | POA: Diagnosis not present

## 2019-05-08 DIAGNOSIS — Z3202 Encounter for pregnancy test, result negative: Secondary | ICD-10-CM | POA: Diagnosis not present

## 2019-05-08 DIAGNOSIS — N924 Excessive bleeding in the premenopausal period: Secondary | ICD-10-CM

## 2019-05-08 LAB — POCT URINE PREGNANCY: Preg Test, Ur: NEGATIVE

## 2019-05-08 NOTE — Patient Instructions (Signed)
Intrauterine Device Insertion, Care After  This sheet gives you information about how to care for yourself after your procedure. Your health care provider may also give you more specific instructions. If you have problems or questions, contact your health care provider. What can I expect after the procedure? After the procedure, it is common to have:  Cramps and pain in the abdomen.  Light bleeding (spotting) or heavier bleeding that is like your menstrual period. This may last for up to a few days.  Lower back pain.  Dizziness.  Headaches.  Nausea. Follow these instructions at home:  Before resuming sexual activity, check to make sure that you can feel the IUD string(s). You should be able to feel the end of the string(s) below the opening of your cervix. If your IUD string is in place, you may resume sexual activity. ? If you had a hormonal IUD inserted more than 7 days after your most recent period started, you will need to use a backup method of birth control for 7 days after IUD insertion. Ask your health care provider whether this applies to you.  Continue to check that the IUD is still in place by feeling for the string(s) after every menstrual period, or once a month.  Take over-the-counter and prescription medicines only as told by your health care provider.  Do not drive or use heavy machinery while taking prescription pain medicine.  Keep all follow-up visits as told by your health care provider. This is important. Contact a health care provider if:  You have bleeding that is heavier or lasts longer than a normal menstrual cycle.  You have a fever.  You have cramps or abdominal pain that get worse or do not get better with medicine.  You develop abdominal pain that is new or is not in the same area of earlier cramping and pain.  You feel lightheaded or weak.  You have abnormal or bad-smelling discharge from your vagina.  You have pain during sexual  activity.  You have any of the following problems with your IUD string(s): ? The string bothers or hurts you or your sexual partner. ? You cannot feel the string. ? The string has gotten longer.  You can feel the IUD in your vagina.  You think you may be pregnant, or you miss your menstrual period.  You think you may have an STI (sexually transmitted infection). Get help right away if:  You have flu-like symptoms.  You have a fever and chills.  You can feel that your IUD has slipped out of place. Summary  After the procedure, it is common to have cramps and pain in the abdomen. It is also common to have light bleeding (spotting) or heavier bleeding that is like your menstrual period.  Continue to check that the IUD is still in place by feeling for the string(s) after every menstrual period, or once a month.  Keep all follow-up visits as told by your health care provider. This is important.  Contact your health care provider if you have problems with your IUD string(s), such as the string getting longer or bothering you or your sexual partner. This information is not intended to replace advice given to you by your health care provider. Make sure you discuss any questions you have with your health care provider. Document Revised: 03/30/2017 Document Reviewed: 03/08/2016 Elsevier Patient Education  2020 [employer].

## 2019-05-08 NOTE — Progress Notes (Signed)
    GYNECOLOGY OFFICE PROCEDURE NOTE  Judy Munoz is a 49 y.o. G1P1001 here for Mirena IUD insertion. No GYN concerns.  Last pap smear was on 03/13/2017 and was normal.   IUD Insertion Procedure Note Patient identified, informed consent performed, consent signed.   Discussed risks of irregular bleeding, cramping, infection, malpositioning or misplacement of the IUD outside the uterus which may require further procedure such as laparoscopy. Also discussed >99% contraception efficacy, increased risk of ectopic pregnancy with failure of method.  Time out was performed.  Urine pregnancy test negative.  Speculum placed in the vagina.  Cervix visualized.  Cleaned with Betadine x 2.  Grasped anteriorly with a single tooth tenaculum.  Uterus sounded to 7.5 cm.  Mirena IUD placed per manufacturer's recommendations.  Strings trimmed to 3 cm. Tenaculum was removed, good hemostasis noted.  Patient tolerated procedure well.   Patient was given post-procedure instructions.  She was advised to have backup contraception for one week.  Patient was also asked to check IUD strings periodically and follow up in 4 weeks for IUD check.   Lot: [id number] Exp: Feb 2023

## 2019-05-08 NOTE — Progress Notes (Signed)
Pt present for IUD insertion. LMP 04/27/19 UPT negative. Pt stated that she is doing well.

## 2019-05-27 ENCOUNTER — Encounter: Payer: Self-pay | Admitting: Family Medicine

## 2019-06-05 ENCOUNTER — Encounter: Payer: Self-pay | Admitting: Obstetrics and Gynecology

## 2019-06-05 ENCOUNTER — Ambulatory Visit: Payer: 59 | Admitting: Obstetrics and Gynecology

## 2019-06-05 ENCOUNTER — Other Ambulatory Visit: Payer: Self-pay

## 2019-06-05 VITALS — BP 108/70 | HR 72 | Ht 63.0 in | Wt 159.1 lb

## 2019-06-05 DIAGNOSIS — N924 Excessive bleeding in the premenopausal period: Secondary | ICD-10-CM

## 2019-06-05 DIAGNOSIS — Z30431 Encounter for routine checking of intrauterine contraceptive device: Secondary | ICD-10-CM

## 2019-06-05 NOTE — Progress Notes (Signed)
     GYNECOLOGY OFFICE ENCOUNTER NOTE  History:  49 y.o. G1P1001 here today for today for IUD string check; Mirena  IUD was placed  05/08/2019. Inserted forNo complaints about the IUD, no concerning side effects.  No GYN concerns. Notes most recent cycle was not as heavy, and did not pass clots. Still lasted 9 days.  Does report some spotting just about daily.     The following portions of the patient's history were reviewed and updated as appropriate: allergies, current medications, past family history, past medical history, past social history, past surgical history and problem list. Last pap smear on 03/13/2017 was normal, negative HRHPV.   Review of Systems:  Pertinent items are noted in HPI.  Objective:  Physical Exam Blood pressure 108/70, pulse 72, height 5' 3"  (1.6 m), weight 159 lb 1.6 oz (72.2 kg). CONSTITUTIONAL: Well-developed, well-nourished female in no acute distress.  ABDOMEN: Soft, no distention noted.   PELVIC: Normal appearing external genitalia; normal appearing vaginal mucosa and cervix.  IUD strings visualized, about 2 cm in length outside cervix.  EXTREMITIES: extremities normal, atraumatic, no cyanosis or edema NEUROLOGIC: Grossly normal   Assessment & Plan:  Patient to keep IUD in place for up to five years; can come in for removal if she desires pregnancy earlier or for any concerning side effects.    Rubie Maid, MD [facility]

## 2019-06-05 NOTE — Progress Notes (Signed)
Patient here for IUD check.  Patient c/o light vaginal spotting since insertion, having to wear a panty liner daily.

## 2019-07-14 ENCOUNTER — Other Ambulatory Visit: Payer: Self-pay

## 2019-07-14 ENCOUNTER — Encounter: Payer: Self-pay | Admitting: Family Medicine

## 2019-07-14 ENCOUNTER — Ambulatory Visit (INDEPENDENT_AMBULATORY_CARE_PROVIDER_SITE_OTHER): Payer: 59 | Admitting: Family Medicine

## 2019-07-14 VITALS — BP 112/78 | HR 70 | Temp 98.4°F | Ht 63.39 in | Wt 162.0 lb

## 2019-07-14 DIAGNOSIS — E7800 Pure hypercholesterolemia, unspecified: Secondary | ICD-10-CM

## 2019-07-14 DIAGNOSIS — K219 Gastro-esophageal reflux disease without esophagitis: Secondary | ICD-10-CM | POA: Diagnosis not present

## 2019-07-14 DIAGNOSIS — Z0000 Encounter for general adult medical examination without abnormal findings: Secondary | ICD-10-CM

## 2019-07-14 DIAGNOSIS — J4520 Mild intermittent asthma, uncomplicated: Secondary | ICD-10-CM

## 2019-07-14 DIAGNOSIS — R7301 Impaired fasting glucose: Secondary | ICD-10-CM

## 2019-07-14 DIAGNOSIS — D649 Anemia, unspecified: Secondary | ICD-10-CM | POA: Diagnosis not present

## 2019-07-14 LAB — UA/M W/RFLX CULTURE, ROUTINE
Bilirubin, UA: NEGATIVE
Glucose, UA: NEGATIVE
Ketones, UA: NEGATIVE
Leukocytes,UA: NEGATIVE
Nitrite, UA: NEGATIVE
Protein,UA: NEGATIVE
Specific Gravity, UA: >1.03 — ABNORMAL HIGH (ref 1.005–1.030)
Urobilinogen, Ur: 0.2 mg/dL (ref 0.2–1.0)
pH, UA: 5 (ref 5.0–7.5)

## 2019-07-14 LAB — MICROSCOPIC EXAMINATION

## 2019-07-14 MED ORDER — ALBUTEROL SULFATE HFA 108 (90 BASE) MCG/ACT IN AERS: INHALATION_SPRAY | RESPIRATORY_TRACT | 6 refills | Status: DC

## 2019-07-14 MED ORDER — PANTOPRAZOLE SODIUM 40 MG PO TBEC: 40.0000 mg | DELAYED_RELEASE_TABLET | Freq: Two times a day (BID) | ORAL | 1 refills | Status: DC

## 2019-07-14 MED ORDER — PRAVASTATIN SODIUM 20 MG PO TABS: ORAL_TABLET | ORAL | 1 refills | Status: DC

## 2019-07-14 NOTE — Assessment & Plan Note (Signed)
Under good control on current regimen. Continue current regimen. Continue to monitor. Call with any concerns. Refills given. Labs drawn today.   

## 2019-07-14 NOTE — Assessment & Plan Note (Signed)
Rechecking levels today. Await results. Call with any concerns.  

## 2019-07-14 NOTE — Patient Instructions (Addendum)
We are recommending the vaccine to everyone who has not had an allergic reaction to any of the components of the vaccine. If you have specific questions about the vaccine, please bring them up with your health care provider to discuss them.   We will likely not be getting the vaccine in the office for the first rounds of vaccinations. The way they are releasing the vaccines is going to be through the health systems (like [place], [facility], [facility], [facility]), through your county health department, or through the pharmacies.   The [facility] Department is giving vaccines to those 65+ and Health Care Workers Teachers and [place] providers start 06/25/19, Essential workers start 3/10 and those with co-morbidities start 07/16/19 Call [phone] to schedule  If you are 65+ you can get a vaccine through [facility] by signing up for an appointment.  You can sign up by going to: [email or web address].  You can get more information by going to: [email or web address]  [employer] next door is giving the [employer]- you can call [phone] or stop by there to schedule.  Health Maintenance, Female Adopting a healthy lifestyle and getting preventive care are important in promoting health and wellness. Ask your health care provider about:  The right schedule for you to have regular tests and exams.  Things you can do on your own to prevent diseases and keep yourself healthy. What should I know about diet, weight, and exercise? Eat a healthy diet   Eat a diet that includes plenty of vegetables, fruits, low-fat dairy products, and lean protein.  Do not eat a lot of foods that are high in solid fats, added sugars, or sodium. Maintain a healthy weight Body mass index (BMI) is used to identify weight problems. It estimates body fat based on height and weight. Your health care provider can help determine your BMI and help you achieve or maintain a healthy weight. Get regular  exercise Get regular exercise. This is one of the most important things you can do for your health. Most adults should:  Exercise for at least 150 minutes each week. The exercise should increase your heart rate and make you sweat (moderate-intensity exercise).  Do strengthening exercises at least twice a week. This is in addition to the moderate-intensity exercise.  Spend less time sitting. Even light physical activity can be beneficial. Watch cholesterol and blood lipids Have your blood tested for lipids and cholesterol at 48 years of age, then have this test every 5 years. Have your cholesterol levels checked more often if:  Your lipid or cholesterol levels are high.  You are older than 48 years of age.  You are at high risk for heart disease. What should I know about cancer screening? Depending on your health history and family history, you may need to have cancer screening at various ages. This may include screening for:  Breast cancer.  Cervical cancer.  Colorectal cancer.  Skin cancer.  Lung cancer. What should I know about heart disease, diabetes, and high blood pressure? Blood pressure and heart disease  High blood pressure causes heart disease and increases the risk of stroke. This is more likely to develop in people who have high blood pressure readings, are of African descent, or are overweight.  Have your blood pressure checked: ? Every 3-5 years if you are 84-37 years of age. ? Every year if you are 72 years old or older. Diabetes Have regular diabetes screenings. This checks your fasting blood sugar level. Have the  screening done:  Once every three years after age 8 if you are at a normal weight and have a low risk for diabetes.  More often and at a younger age if you are overweight or have a high risk for diabetes. What should I know about preventing infection? Hepatitis B If you have a higher risk for hepatitis B, you should be screened for this virus.  Talk with your health care provider to find out if you are at risk for hepatitis B infection. Hepatitis C Testing is recommended for:  Everyone born from [birth year] through 1964.  Anyone with known risk factors for hepatitis C. Sexually transmitted infections (STIs)  Get screened for STIs, including gonorrhea and chlamydia, if: ? You are sexually active and are younger than 48 years of age. ? You are older than 48 years of age and your health care provider tells you that you are at risk for this type of infection. ? Your sexual activity has changed since you were last screened, and you are at increased risk for chlamydia or gonorrhea. Ask your health care provider if you are at risk.  Ask your health care provider about whether you are at high risk for HIV. Your health care provider may recommend a prescription medicine to help prevent HIV infection. If you choose to take medicine to prevent HIV, you should first get tested for HIV. You should then be tested every 3 months for as long as you are taking the medicine. Pregnancy  If you are about to stop having your period (premenopausal) and you may become pregnant, seek counseling before you get pregnant.  Take 400 to 800 micrograms (mcg) of folic acid every day if you become pregnant.  Ask for birth control (contraception) if you want to prevent pregnancy. Osteoporosis and menopause Osteoporosis is a disease in which the bones lose minerals and strength with aging. This can result in bone fractures. If you are 12 years old or older, or if you are at risk for osteoporosis and fractures, ask your health care provider if you should:  Be screened for bone loss.  Take a calcium or vitamin D supplement to lower your risk of fractures.  Be given hormone replacement therapy (HRT) to treat symptoms of menopause. Follow these instructions at home: Lifestyle  Do not use any products that contain nicotine or tobacco, such as cigarettes, e-cigarettes,  and chewing tobacco. If you need help quitting, ask your health care provider.  Do not use street drugs.  Do not share needles.  Ask your health care provider for help if you need support or information about quitting drugs. Alcohol use  Do not drink alcohol if: ? Your health care provider tells you not to drink. ? You are pregnant, may be pregnant, or are planning to become pregnant.  If you drink alcohol: ? Limit how much you use to 0-1 drink a day. ? Limit intake if you are breastfeeding.  Be aware of how much alcohol is in your drink. In the [place] one drink equals one 12 oz bottle of beer (355 mL), one 5 oz glass of wine (148 mL), or one 1 oz glass of hard liquor (44 mL). General instructions  Schedule regular health, dental, and eye exams.  Stay current with your vaccines.  Tell your health care provider if: ? You often feel depressed. ? You have ever been abused or do not feel safe at home. Summary  Adopting a healthy lifestyle and getting preventive care are important  in promoting health and wellness.  Follow your health care provider's instructions about healthy diet, exercising, and getting tested or screened for diseases.  Follow your health care provider's instructions on monitoring your cholesterol and blood pressure. This information is not intended to replace advice given to you by your health care provider. Make sure you discuss any questions you have with your health care provider. Document Revised: 04/10/2018 Document Reviewed: 04/10/2018 Elsevier Patient Education  2020 [employer].

## 2019-07-14 NOTE — Progress Notes (Signed)
BP 112/78   Pulse 70   Temp 98.4 F (36.9 C)   Ht 5' 3.39" (1.61 m)   Wt 162 lb (73.5 kg)   SpO2 100%   BMI 28.35 kg/m    Subjective:    Patient ID: Judy Munoz, female    DOB: 1970/09/27, 49 y.o.   MRN: [id number]  HPI: Judy Munoz is a 49 y.o. female presenting on 07/14/2019 for comprehensive medical examination. Current medical complaints include:  HYPERLIPIDEMIA Hyperlipidemia status: excellent compliance Satisfied with current treatment?  no Side effects:  no Medication compliance: excellent compliance Past cholesterol meds: pravastain Supplements: none Aspirin:  no The 10-year ASCVD risk score [name], et al., 2013) is: 0.5%   Values used to calculate the score:     Age: 72 years     Sex: Female     Is Non-Hispanic African American: No     Diabetic: No     Tobacco smoker: No     Systolic Blood Pressure: 480 mmHg     Is BP treated: No     HDL Cholesterol: 62 mg/dL     Total Cholesterol: 168 mg/dL Chest pain:  no Coronary artery disease:  no  Impaired Fasting Glucose HbA1C:  Lab Results  Component Value Date   HGBA1C 5.8 01/13/2019   Duration of elevated blood sugar: chronic Polydipsia: no Polyuria: no Weight change: no Visual disturbance: no Glucose Monitoring: no    Accucheck frequency: Not Checking Diabetic Education: Not Completed Family history of diabetes: yes  GERD GERD control status: controlled  Satisfied with current treatment? yes Heartburn frequency: occasionally Medication side effects: no  Medication compliance: excellent Dysphagia: no Odynophagia:  no Hematemesis: no Blood in stool: no EGD: no  She currently lives with: husband and son Menopausal Symptoms: no  Depression Screen done today and results listed below:  Depression screen [facility] 2/9 07/14/2019 06/27/2018 06/27/2018 06/20/2017 06/14/2016  Decreased Interest 0 0 0 0 0  Down, Depressed, Hopeless 0 0 0 0 0  PHQ - 2 Score 0 0 0 0 0  Altered sleeping 0 0 0 0 -   Tired, decreased energy 0 0 0 1 -  Change in appetite 0 0 0 0 -  Feeling bad or failure about yourself  0 0 0 0 -  Trouble concentrating 0 0 0 0 -  Moving slowly or fidgety/restless 0 0 0 0 -  Suicidal thoughts 0 0 0 0 -  PHQ-9 Score 0 0 0 1 -  Difficult doing work/chores Not difficult at all Not difficult at all - Not difficult at all -    Past Medical History:  Past Medical History:  Diagnosis Date  . Allergy Seasonal  . Anemia    H/O WITH PREGNANCY  . Asthma   . Biliary dyskinesia   . Elevated glucose   . GERD (gastroesophageal reflux disease)   . Headache    MIGRAINES RARE  . Heart murmur    WAS TOLD THIS ONCE YEARS AGO-HAS NEVER BEEN TOLD SINCE  . High cholesterol   . History of uterine fibroid   . IFG (impaired fasting glucose)   . PONV (postoperative nausea and vomiting)    HAPPENED WITH HER 1ST EGD- HER 2ND EGD SHE WAS GIVEN ANTI-EMETIC IV AND HAD NO N/V  . Trapezius muscle strain   . Vitamin D deficiency     Surgical History:  Past Surgical History:  Procedure Laterality Date  . CHOLECYSTECTOMY N/A 07/18/2016   Procedure: LAPAROSCOPIC  CHOLECYSTECTOMY;  Surgeon: Jules Husbands, MD;  Location: [facility];  Service: General;  Laterality: N/A;  . CHOLECYSTECTOMY, LAPAROSCOPIC  07/18/2016  . ESOPHAGOGASTRODUODENOSCOPY    . ESOPHAGOGASTRODUODENOSCOPY (EGD) WITH PROPOFOL N/A 07/26/2015   Procedure: ESOPHAGOGASTRODUODENOSCOPY (EGD) WITH PROPOFOL;  Surgeon: Lucilla Lame, MD;  Location: [facility];  Service: Endoscopy;  Laterality: N/A;    Medications:  Current Outpatient Medications on File Prior to Visit  Medication Sig  . levonorgestrel (MIRENA) 20 MCG/24HR IUD by Intrauterine route once.   . cetirizine (ZYRTEC) 10 MG tablet Take 10 mg by mouth daily.  . Cholecalciferol (VITAMIN D-400 PO) Take 800 mg by mouth.   . fluticasone (FLONASE) 50 MCG/ACT nasal spray Place into the nose.  . naproxen (NAPROSYN) 500 MG tablet Take 1 tablet (500 mg total) by mouth 2  (two) times daily with a meal. (Patient taking differently: Take 500 mg by mouth 2 (two) times daily with a meal. As needed.)   No current facility-administered medications on file prior to visit.    Allergies:  Allergies  Allergen Reactions  . Sulfa Antibiotics Hives    Social History:  Social History   Socioeconomic History  . Marital status: Married    Spouse name: Not on file  . Number of children: Not on file  . Years of education: Not on file  . Highest education level: Not on file  Occupational History  . Not on file  Tobacco Use  . Smoking status: Never Smoker  . Smokeless tobacco: Never Used  Substance and Sexual Activity  . Alcohol use: No  . Drug use: No  . Sexual activity: Yes    Birth control/protection: I.U.D.  Other Topics Concern  . Not on file  Social History Narrative  . Not on file   Social Determinants of Health   Financial Resource Strain:   . Difficulty of Paying Living Expenses:   Food Insecurity:   . Worried About Charity fundraiser in the Last Year:   . Arboriculturist in the Last Year:   Transportation Needs:   . Film/video editor (Medical):   Marland Kitchen Lack of Transportation (Non-Medical):   Physical Activity:   . Days of Exercise per Week:   . Minutes of Exercise per Session:   Stress:   . Feeling of Stress :   Social Connections:   . Frequency of Communication with Friends and Family:   . Frequency of Social Gatherings with Friends and Family:   . Attends Religious Services:   . Active Member of Clubs or Organizations:   . Attends Archivist Meetings:   Marland Kitchen Marital Status:   Intimate Partner Violence:   . Fear of Current or Ex-Partner:   . Emotionally Abused:   Marland Kitchen Physically Abused:   . Sexually Abused:    Social History   Tobacco Use  Smoking Status Never Smoker  Smokeless Tobacco Never Used   Social History   Substance and Sexual Activity  Alcohol Use No    Family History:  Family History  Problem  Relation Age of Onset  . Diabetes Father   . Heart disease Father   . Hypertension Father   . Hyperlipidemia Father   . Thyroid disease Mother   . Cancer Maternal Grandmother        uterine  . Breast cancer Maternal Grandmother 65  . Thyroid disease Maternal Grandmother   . Lung cancer Maternal Uncle     Past medical history, surgical history, medications, allergies,  family history and social history reviewed with patient today and changes made to appropriate areas of the chart.   Review of Systems  Constitutional: Negative.   HENT: Negative.   Eyes: Negative.   Respiratory: Positive for shortness of breath. Negative for cough, hemoptysis, sputum production and wheezing.   Cardiovascular: Negative.   Gastrointestinal: Negative.   Genitourinary: Negative.   Musculoskeletal: Negative.   Skin: Negative.   Neurological: Negative.   Endo/Heme/Allergies: Positive for environmental allergies. Negative for polydipsia. Does not bruise/bleed easily.  Psychiatric/Behavioral: Negative.     All other ROS negative except what is listed above and in the HPI.      Objective:    BP 112/78   Pulse 70   Temp 98.4 F (36.9 C)   Ht 5' 3.39" (1.61 m)   Wt 162 lb (73.5 kg)   SpO2 100%   BMI 28.35 kg/m   Wt Readings from Last 3 Encounters:  07/14/19 162 lb (73.5 kg)  06/05/19 159 lb 1.6 oz (72.2 kg)  05/08/19 159 lb 3.2 oz (72.2 kg)    Physical Exam Vitals and nursing note reviewed.  Constitutional:      General: She is not in acute distress.    Appearance: Normal appearance. She is not ill-appearing, toxic-appearing or diaphoretic.  HENT:     Head: Normocephalic and atraumatic.     Right Ear: Tympanic membrane, ear canal and external ear normal. There is no impacted cerumen.     Left Ear: Tympanic membrane, ear canal and external ear normal. There is no impacted cerumen.     Nose: Nose normal. No congestion or rhinorrhea.     Mouth/Throat:     Mouth: Mucous membranes are moist.      Pharynx: Oropharynx is clear. No oropharyngeal exudate or posterior oropharyngeal erythema.  Eyes:     General: No scleral icterus.       Right eye: No discharge.        Left eye: No discharge.     Extraocular Movements: Extraocular movements intact.     Conjunctiva/sclera: Conjunctivae normal.     Pupils: Pupils are equal, round, and reactive to light.  Neck:     Vascular: No carotid bruit.  Cardiovascular:     Rate and Rhythm: Normal rate and regular rhythm.     Pulses: Normal pulses.     Heart sounds: No murmur. No friction rub. No gallop.   Pulmonary:     Effort: Pulmonary effort is normal. No respiratory distress.     Breath sounds: Normal breath sounds. No stridor. No wheezing, rhonchi or rales.  Chest:     Chest wall: No tenderness.  Abdominal:     General: Abdomen is flat. Bowel sounds are normal. There is no distension.     Palpations: Abdomen is soft. There is no mass.     Tenderness: There is no abdominal tenderness. There is no right CVA tenderness, left CVA tenderness, guarding or rebound.     Hernia: No hernia is present.  Genitourinary:    Comments: Breast and pelvic exams deferred with shared decision making Musculoskeletal:        General: No swelling, tenderness, deformity or signs of injury.     Cervical back: Normal range of motion and neck supple. No rigidity. No muscular tenderness.     Right lower leg: No edema.     Left lower leg: No edema.  Lymphadenopathy:     Cervical: No cervical adenopathy.  Skin:    General: Skin  is warm and dry.     Capillary Refill: Capillary refill takes less than 2 seconds.     Coloration: Skin is not jaundiced or pale.     Findings: No bruising, erythema, lesion or rash.  Neurological:     General: No focal deficit present.     Mental Status: She is alert and oriented to person, place, and time. Mental status is at baseline.     Cranial Nerves: No cranial nerve deficit.     Sensory: No sensory deficit.     Motor: No  weakness.     Coordination: Coordination normal.     Gait: Gait normal.     Deep Tendon Reflexes: Reflexes normal.  Psychiatric:        Mood and Affect: Mood normal.        Behavior: Behavior normal.        Thought Content: Thought content normal.        Judgment: Judgment normal.     Results for orders placed or performed in visit on 05/08/19  POCT urine pregnancy  Result Value Ref Range   Preg Test, Ur Negative Negative      Assessment & Plan:   Problem List Items Addressed This Visit      Respiratory   Reactive airway disease    Under good control on current regimen. Continue current regimen. Continue to monitor. Call with any concerns. Refills given. Labs drawn today.         Digestive   GERD (gastroesophageal reflux disease)    Under good control on current regimen. Continue current regimen. Continue to monitor. Call with any concerns. Refills given. Labs drawn today.       Relevant Medications   pantoprazole (PROTONIX) 40 MG tablet     Endocrine   IFG (impaired fasting glucose)    Under good control on current regimen. Continue current regimen. Continue to monitor. Call with any concerns. Refills given. Labs drawn today.       Relevant Orders   Hgb A1c w/o eAG     Other   High cholesterol    Under good control on current regimen. Continue current regimen. Continue to monitor. Call with any concerns. Refills given. Labs drawn today.       Relevant Medications   pravastatin (PRAVACHOL) 20 MG tablet   Other Relevant Orders   Lipid Panel w/o Chol/HDL Ratio   Anemia    Rechecking levels today. Await results. Call with any concerns.        Relevant Orders   CBC with Differential/Platelet    Other Visit Diagnoses    Routine general medical examination at a health care facility    -  Primary   Vaccines up to date. Screening labs checked today. Pap and mammogram up to date. Call with any concerns. Continue diet and exercise.    Relevant Orders   CBC  with Differential/Platelet   Comprehensive metabolic panel   TSH   UA/M w/rflx Culture, Routine       Follow up plan: Return in about 6 months (around 01/14/2020).   LABORATORY TESTING:  - Pap smear: up to date  IMMUNIZATIONS:   - Tdap: Tetanus vaccination status reviewed: last tetanus booster within 10 years. - Influenza: Up to date - Pneumovax: Not applicable  SCREENING: -Mammogram: Up to date   PATIENT COUNSELING:   Advised to take 1 mg of folate supplement per day if capable of pregnancy.   Sexuality: Discussed sexually transmitted diseases, partner selection, use  of condoms, avoidance of unintended pregnancy  and contraceptive alternatives.   Advised to avoid cigarette smoking.  I discussed with the patient that most people either abstain from alcohol or drink within safe limits (<=14/week and <=4 drinks/occasion for males, <=7/weeks and <= 3 drinks/occasion for females) and that the risk for alcohol disorders and other health effects rises proportionally with the number of drinks per week and how often a drinker exceeds daily limits.  Discussed cessation/primary prevention of drug use and availability of treatment for abuse.   Diet: Encouraged to adjust caloric intake to maintain  or achieve ideal body weight, to reduce intake of dietary saturated fat and total fat, to limit sodium intake by avoiding high sodium foods and not adding table salt, and to maintain adequate dietary potassium and calcium preferably from fresh fruits, vegetables, and low-fat dairy products.    stressed the importance of regular exercise  Injury prevention: Discussed safety belts, safety helmets, smoke detector, smoking near bedding or upholstery.   Dental health: Discussed importance of regular tooth brushing, flossing, and dental visits.    NEXT PREVENTATIVE PHYSICAL DUE IN 1 YEAR. Return in about 6 months (around 01/14/2020).

## 2019-07-15 LAB — COMPREHENSIVE METABOLIC PANEL
ALT: 13 IU/L (ref 0–32)
AST: 17 IU/L (ref 0–40)
Albumin/Globulin Ratio: 2.2 (ref 1.2–2.2)
Albumin: 4.4 g/dL (ref 3.8–4.8)
Alkaline Phosphatase: 57 IU/L (ref 39–117)
BUN/Creatinine Ratio: 15 (ref 9–23)
BUN: 13 mg/dL (ref 6–24)
Bilirubin Total: 0.3 mg/dL (ref 0.0–1.2)
CO2: 23 mmol/L (ref 20–29)
Calcium: 8.9 mg/dL (ref 8.7–10.2)
Chloride: 103 mmol/L (ref 96–106)
Creatinine, Ser: 0.84 mg/dL (ref 0.57–1.00)
GFR calc Af Amer: 96 mL/min/{1.73_m2} (ref >59)
GFR calc non Af Amer: 83 mL/min/{1.73_m2} (ref >59)
Globulin, Total: 2 g/dL (ref 1.5–4.5)
Glucose: 93 mg/dL (ref 65–99)
Potassium: 4.3 mmol/L (ref 3.5–5.2)
Sodium: 138 mmol/L (ref 134–144)
Total Protein: 6.4 g/dL (ref 6.0–8.5)

## 2019-07-15 LAB — CBC WITH DIFFERENTIAL/PLATELET
Basophils Absolute: 0.1 10*3/uL (ref 0.0–0.2)
Basos: 1 %
EOS (ABSOLUTE): 0.1 10*3/uL (ref 0.0–0.4)
Eos: 2 %
Hematocrit: 37.1 % (ref 34.0–46.6)
Hemoglobin: 12.4 g/dL (ref 11.1–15.9)
Immature Grans (Abs): 0 10*3/uL (ref 0.0–0.1)
Immature Granulocytes: 0 %
Lymphocytes Absolute: 2.1 10*3/uL (ref 0.7–3.1)
Lymphs: 33 %
MCH: 28.5 pg (ref 26.6–33.0)
MCHC: 33.4 g/dL (ref 31.5–35.7)
MCV: 85 fL (ref 79–97)
Monocytes Absolute: 0.5 10*3/uL (ref 0.1–0.9)
Monocytes: 7 %
Neutrophils Absolute: 3.7 10*3/uL (ref 1.4–7.0)
Neutrophils: 57 %
Platelets: 287 10*3/uL (ref 150–450)
RBC: 4.35 x10E6/uL (ref 3.77–5.28)
RDW: 12.8 % (ref 11.7–15.4)
WBC: 6.4 10*3/uL (ref 3.4–10.8)

## 2019-07-15 LAB — TSH: TSH: 2.95 u[IU]/mL (ref 0.450–4.500)

## 2019-07-15 LAB — LIPID PANEL W/O CHOL/HDL RATIO
Cholesterol, Total: 153 mg/dL (ref 100–199)
HDL: 59 mg/dL (ref >39)
LDL Chol Calc (NIH): 80 mg/dL (ref 0–99)
Triglycerides: 74 mg/dL (ref 0–149)
VLDL Cholesterol Cal: 14 mg/dL (ref 5–40)

## 2019-07-15 LAB — HGB A1C W/O EAG: Hgb A1c MFr Bld: 5.8 % — ABNORMAL HIGH (ref 4.8–5.6)

## 2019-11-07 ENCOUNTER — Encounter: Payer: Self-pay | Admitting: Family Medicine

## 2019-11-11 IMAGING — MG DIGITAL SCREENING BILATERAL MAMMOGRAM WITH TOMO AND CAD
6 of 10 series · 6 of 30 positions shown · non-contrast
Comparison: Previous exam(s).

CLINICAL DATA: Screening.

EXAM:
DIGITAL SCREENING BILATERAL MAMMOGRAM WITH TOMO AND CAD

[R CC synth-2D (1 of 2)]
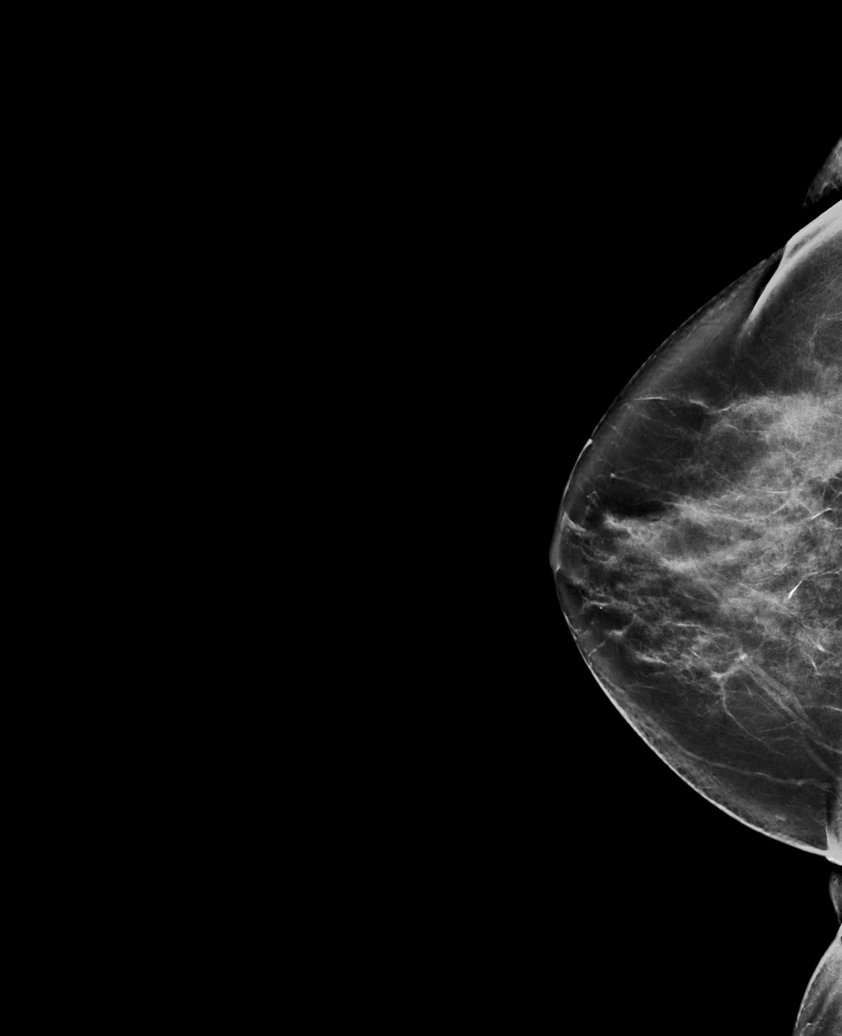

[R MLO synth-2D]
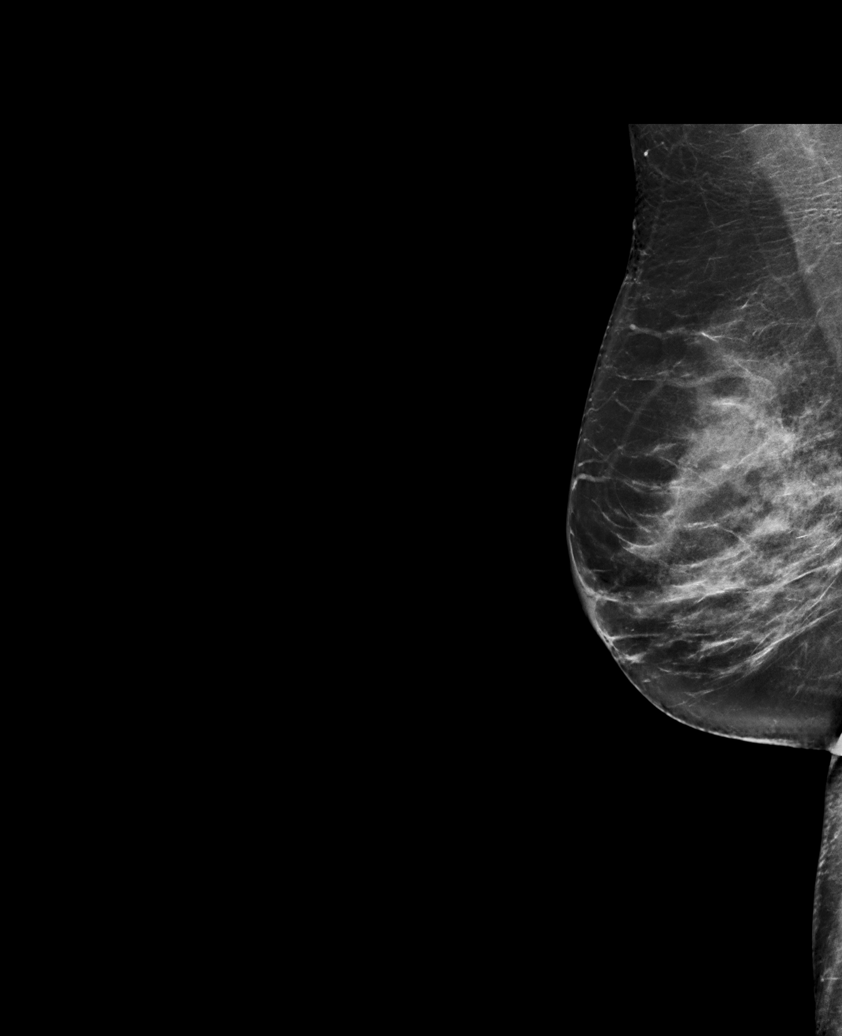

[R CC synth-2D (2 of 2)]
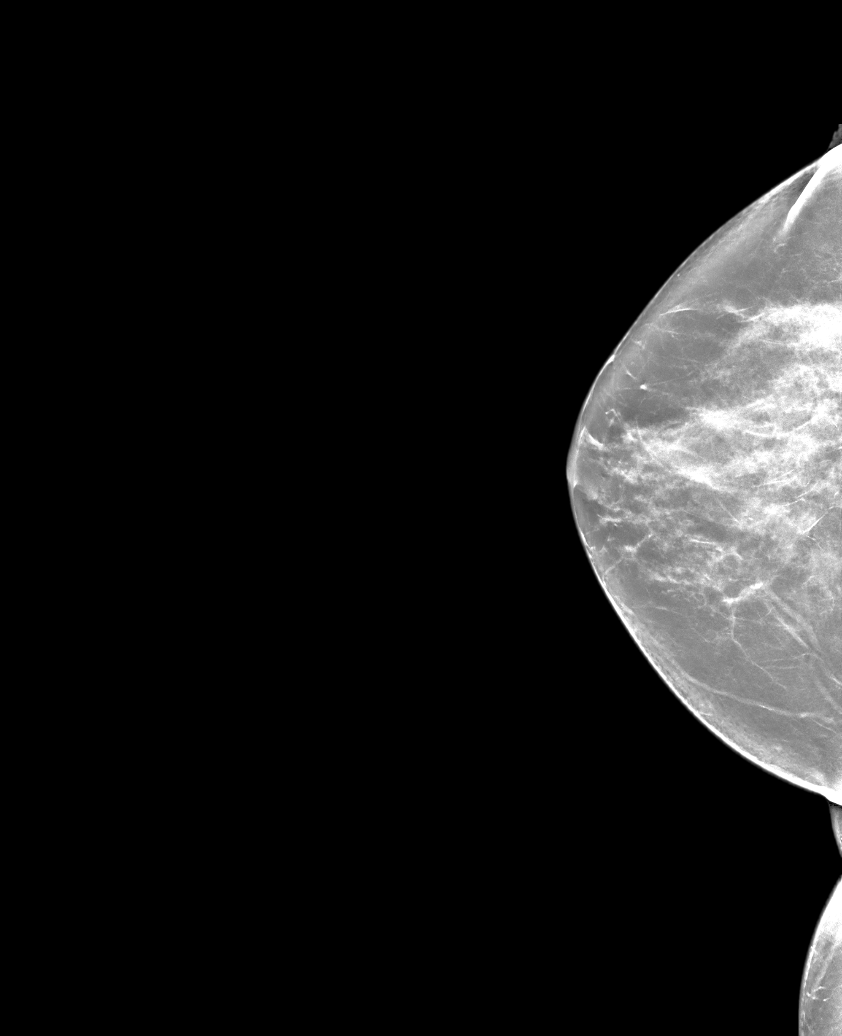

[L MLO synth-2D]
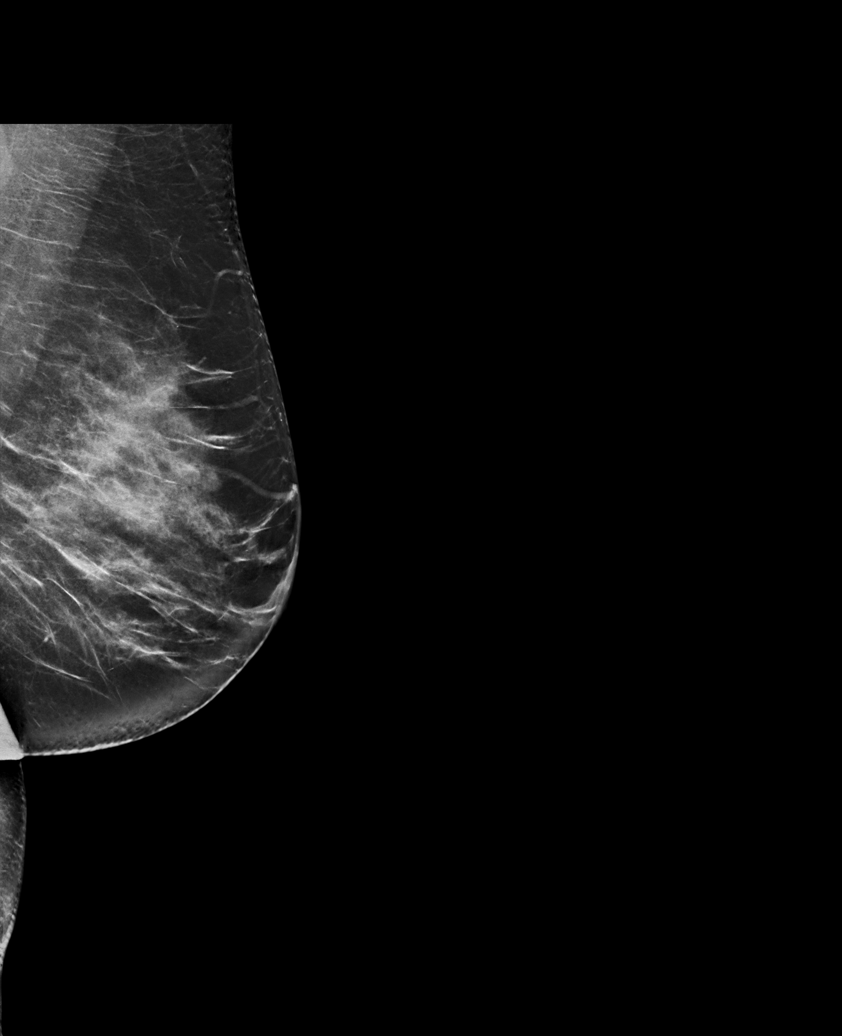

[L CC synth-2D]
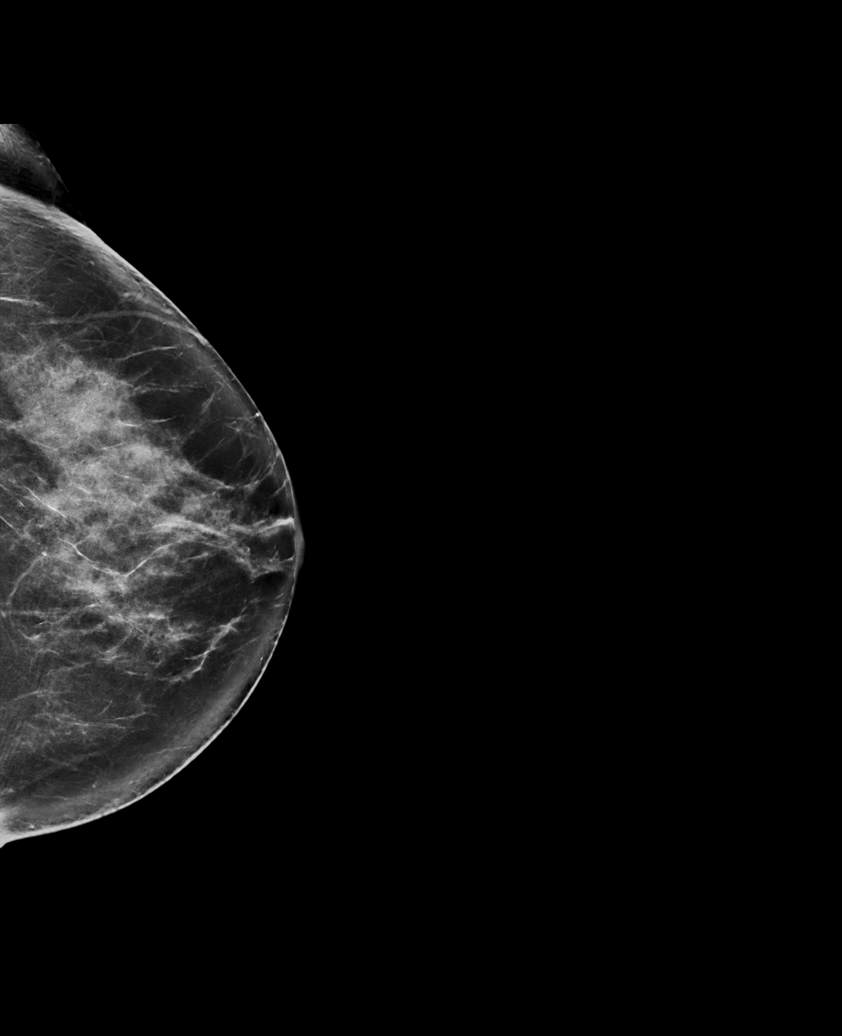

[L MLO tomo · tomo slice 41/82.0]
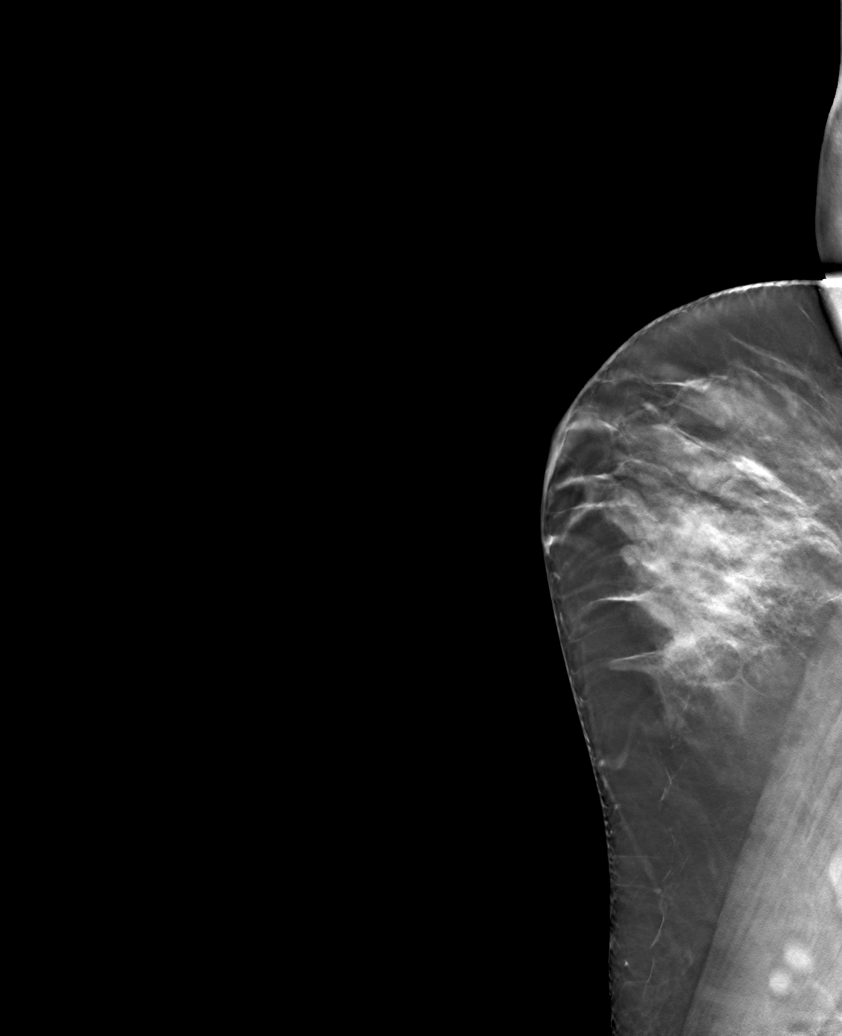

[6 of 30 positions shown; findings below may reference images not displayed]

ACR Breast Density Category c: The breast tissue is heterogeneously
dense, which may obscure small masses.
FINDINGS: There are no findings suspicious for malignancy. Images were
processed with CAD.
IMPRESSION: No mammographic evidence of malignancy. A result letter of this
screening mammogram will be mailed directly to the patient.

RECOMMENDATION:
Screening mammogram in one year. (Code:[id number])

BI-RADS CATEGORY  1: Negative.

## 2019-11-15 ENCOUNTER — Ambulatory Visit: Payer: 59 | Attending: Internal Medicine

## 2019-11-15 DIAGNOSIS — Z23 Encounter for immunization: Secondary | ICD-10-CM

## 2019-11-15 NOTE — Progress Notes (Signed)
   Covid-19 Vaccination Clinic  Name:  Judy Munoz    MRN: [id number] DOB: 05-Oct-1970  11/15/2019  [name] was observed post Covid-19 immunization for 15 minutes without incident. She was provided with Vaccine Information Sheet and instruction to access the V-Safe system.   [name] was instructed to call 911 with any severe reactions post vaccine: Marland Kitchen Difficulty breathing  . Swelling of face and throat  . A fast heartbeat  . A bad rash all over body  . Dizziness and weakness   Immunizations Administered    Name Date Dose VIS Date Route   Pfizer COVID-19 Vaccine 11/15/2019 10:53 AM 0.3 mL 06/25/2018 Intramuscular   Manufacturer: [place]   Lot: [id number]   [place]: [id number]

## 2019-12-08 ENCOUNTER — Ambulatory Visit: Payer: Self-pay

## 2019-12-09 ENCOUNTER — Encounter: Payer: Self-pay | Admitting: Family Medicine

## 2020-01-15 ENCOUNTER — Encounter: Payer: Self-pay | Admitting: Family Medicine

## 2020-01-15 ENCOUNTER — Ambulatory Visit (INDEPENDENT_AMBULATORY_CARE_PROVIDER_SITE_OTHER): Payer: 59 | Admitting: Family Medicine

## 2020-01-15 ENCOUNTER — Other Ambulatory Visit: Payer: Self-pay

## 2020-01-15 VITALS — BP 126/83 | HR 70 | Temp 98.0°F | Ht 63.0 in | Wt 159.0 lb

## 2020-01-15 DIAGNOSIS — R7301 Impaired fasting glucose: Secondary | ICD-10-CM

## 2020-01-15 DIAGNOSIS — D649 Anemia, unspecified: Secondary | ICD-10-CM | POA: Diagnosis not present

## 2020-01-15 DIAGNOSIS — E559 Vitamin D deficiency, unspecified: Secondary | ICD-10-CM | POA: Diagnosis not present

## 2020-01-15 DIAGNOSIS — H65112 Acute and subacute allergic otitis media (mucoid) (sanguinous) (serous), left ear: Secondary | ICD-10-CM

## 2020-01-15 DIAGNOSIS — E7800 Pure hypercholesterolemia, unspecified: Secondary | ICD-10-CM | POA: Diagnosis not present

## 2020-01-15 LAB — BAYER DCA HB A1C WAIVED: HB A1C (BAYER DCA - WAIVED): 5.7 % (ref <7.0)

## 2020-01-15 MED ORDER — NAPROXEN 500 MG PO TABS
500.0000 mg | ORAL_TABLET | Freq: Two times a day (BID) | ORAL | 1 refills | Status: DC
Start: 2020-01-15 — End: 2020-04-15

## 2020-01-15 MED ORDER — AMOXICILLIN 500 MG PO CAPS: 500.0000 mg | ORAL_CAPSULE | Freq: Two times a day (BID) | ORAL | 0 refills | Status: DC

## 2020-01-15 MED ORDER — PANTOPRAZOLE SODIUM 40 MG PO TBEC
40.0000 mg | DELAYED_RELEASE_TABLET | Freq: Two times a day (BID) | ORAL | 1 refills | Status: DC
Start: 2020-01-15 — End: 2020-07-06

## 2020-01-15 MED ORDER — PREDNISONE 50 MG PO TABS: 50.0000 mg | ORAL_TABLET | Freq: Every day | ORAL | 0 refills | Status: DC

## 2020-01-15 MED ORDER — PRAVASTATIN SODIUM 20 MG PO TABS
ORAL_TABLET | ORAL | 1 refills | Status: DC
Start: 2020-01-15 — End: 2020-07-06

## 2020-01-15 MED ORDER — ALBUTEROL SULFATE HFA 108 (90 BASE) MCG/ACT IN AERS
INHALATION_SPRAY | RESPIRATORY_TRACT | 6 refills | Status: DC
Start: 2020-01-15 — End: 2020-07-06

## 2020-01-15 NOTE — Assessment & Plan Note (Signed)
Under good control on current regimen. Continue current regimen. Continue to monitor. Call with any concerns. Refills given. Labs drawn today.   

## 2020-01-15 NOTE — Assessment & Plan Note (Signed)
Rechecking labs today. Await results. Treat as needed.  °

## 2020-01-15 NOTE — Progress Notes (Signed)
BP 126/83 (BP Location: Right Arm, Patient Position: Sitting)    Pulse 70    Temp 98 F (36.7 C) (Oral)    Ht 5' 3"  (1.6 m)    Wt 159 lb (72.1 kg)    SpO2 99%    BMI 28.17 kg/m    Subjective:    Patient ID: Judy Munoz, female    DOB: 18-May-1970, 49 y.o.   MRN: [id number]  HPI: Judy Munoz is a 49 y.o. female  Chief Complaint  Patient presents with   Anemia    follow up    Hyperlipidemia   Otitis Media    X2 weeks ,left ear, constant ache, pressure in ears   Flu Vaccine    wants flu vacine but concerned about ear infection    HYPERLIPIDEMIA Hyperlipidemia status: excellent compliance Satisfied with current treatment?  yes Side effects:  no Medication compliance: excellent compliance Past cholesterol meds: pravastatin Supplements: none Aspirin:  no The 10-year ASCVD risk score [name], et al., 2013) is: 0.6%   Values used to calculate the score:     Age: 25 years     Sex: Female     Is Non-Hispanic African American: No     Diabetic: No     Tobacco smoker: No     Systolic Blood Pressure: 992 mmHg     Is BP treated: No     HDL Cholesterol: 59 mg/dL     Total Cholesterol: 153 mg/dL Chest pain:  no Coronary artery disease:  no  Impaired Fasting Glucose HbA1C:  Lab Results  Component Value Date   HGBA1C 5.8 (H) 07/14/2019   Duration of elevated blood sugar: chronic Polydipsia: no Polyuria: no Weight change: no Visual disturbance: no Glucose Monitoring: no Diabetic Education: Not Completed Family history of diabetes: yes  ANEMIA Anemia status: stable Etiology of anemia: iron def Duration of anemia treatment: chornic  Compliance with treatment: excellent compliance Iron supplementation side effects: no Severity of anemia: mild Fatigue: yes Decreased exercise tolerance: no  Dyspnea on exertion: no Palpitations: no Bleeding: no Pica: no  EAR PAIN Duration: 2 weeks Involved ear(s): left Severity:  moderate  Quality:  Aching and  pressure Fever: no Otorrhea: no Upper respiratory infection symptoms: no Pruritus: no Hearing loss: no Water immersion no Using Q-tips: no Recurrent otitis media: no Status: stable Treatments attempted: pseudoephedrine   Relevant past medical, surgical, family and social history reviewed and updated as indicated. Interim medical history since our last visit reviewed. Allergies and medications reviewed and updated.  Review of Systems  Constitutional: Positive for fatigue. Negative for activity change, appetite change, chills, diaphoresis, fever and unexpected weight change.  HENT: Positive for ear pain and hearing loss. Negative for congestion, dental problem, drooling, ear discharge, facial swelling, mouth sores, nosebleeds, postnasal drip, rhinorrhea, sinus pressure, sinus pain, sneezing, sore throat, tinnitus, trouble swallowing and voice change.   Respiratory: Positive for shortness of breath. Negative for apnea, cough, choking, chest tightness, wheezing and stridor.   Cardiovascular: Negative.   Gastrointestinal: Negative.   Neurological: Negative.   Psychiatric/Behavioral: Negative.     Per HPI unless specifically indicated above     Objective:    BP 126/83 (BP Location: Right Arm, Patient Position: Sitting)    Pulse 70    Temp 98 F (36.7 C) (Oral)    Ht 5' 3"  (1.6 m)    Wt 159 lb (72.1 kg)    SpO2 99%    BMI 28.17 kg/m  Wt Readings from Last 3 Encounters:  01/15/20 159 lb (72.1 kg)  07/14/19 162 lb (73.5 kg)  06/05/19 159 lb 1.6 oz (72.2 kg)    Physical Exam Vitals and nursing note reviewed.  Constitutional:      General: She is not in acute distress.    Appearance: Normal appearance. She is not ill-appearing, toxic-appearing or diaphoretic.  HENT:     Head: Normocephalic and atraumatic.     Right Ear: Tympanic membrane and external ear normal.     Left Ear: External ear normal. A middle ear effusion is present.     Ears:     Comments: Pus behind L TM     Nose: Nose normal.     Mouth/Throat:     Mouth: Mucous membranes are moist.     Pharynx: Oropharynx is clear.  Eyes:     General: No scleral icterus.       Right eye: No discharge.        Left eye: No discharge.     Extraocular Movements: Extraocular movements intact.     Conjunctiva/sclera: Conjunctivae normal.     Pupils: Pupils are equal, round, and reactive to light.  Cardiovascular:     Rate and Rhythm: Normal rate and regular rhythm.     Pulses: Normal pulses.     Heart sounds: Normal heart sounds. No murmur heard.  No friction rub. No gallop.   Pulmonary:     Effort: Pulmonary effort is normal. No respiratory distress.     Breath sounds: Normal breath sounds. No stridor. No wheezing, rhonchi or rales.  Chest:     Chest wall: No tenderness.  Musculoskeletal:        General: Normal range of motion.     Cervical back: Normal range of motion and neck supple.  Skin:    General: Skin is warm and dry.     Capillary Refill: Capillary refill takes less than 2 seconds.     Coloration: Skin is not jaundiced or pale.     Findings: No bruising, erythema, lesion or rash.  Neurological:     General: No focal deficit present.     Mental Status: She is alert and oriented to person, place, and time. Mental status is at baseline.  Psychiatric:        Mood and Affect: Mood normal.        Behavior: Behavior normal.        Thought Content: Thought content normal.        Judgment: Judgment normal.     Results for orders placed or performed in visit on 07/14/19  Microscopic Examination   URINE  Result Value Ref Range   WBC, UA 0-5 0 - 5 /hpf   RBC 3-10 (A) 0 - 2 /hpf   Epithelial Cells (non renal) 0-10 0 - 10 /hpf   Bacteria, UA Few (A) None seen/Few  CBC with Differential/Platelet  Result Value Ref Range   WBC 6.4 3.4 - 10.8 x10E3/uL   RBC 4.35 3.77 - 5.28 x10E6/uL   Hemoglobin 12.4 11.1 - 15.9 g/dL   Hematocrit 37.1 34.0 - 46.6 %   MCV 85 79 - 97 fL   MCH 28.5 26.6 - 33.0 pg    MCHC 33.4 31 - 35 g/dL   RDW 12.8 11.7 - 15.4 %   Platelets 287 150 - 450 x10E3/uL   Neutrophils 57 Not Estab. %   Lymphs 33 Not Estab. %   Monocytes 7 Not Estab. %  Eos 2 Not Estab. %   Basos 1 Not Estab. %   Neutrophils Absolute 3.7 1 - 7 x10E3/uL   Lymphocytes Absolute 2.1 0 - 3 x10E3/uL   Monocytes Absolute 0.5 0 - 0 x10E3/uL   EOS (ABSOLUTE) 0.1 0.0 - 0.4 x10E3/uL   Basophils Absolute 0.1 0 - 0 x10E3/uL   Immature Granulocytes 0 Not Estab. %   Immature Grans (Abs) 0.0 0.0 - 0.1 x10E3/uL  Comprehensive metabolic panel  Result Value Ref Range   Glucose 93 65 - 99 mg/dL   BUN 13 6 - 24 mg/dL   Creatinine, Ser 0.84 0.57 - 1.00 mg/dL   GFR calc non Af Amer 83 >59 mL/min/1.73   GFR calc Af Amer 96 >59 mL/min/1.73   BUN/Creatinine Ratio 15 9 - 23   Sodium 138 134 - 144 mmol/L   Potassium 4.3 3.5 - 5.2 mmol/L   Chloride 103 96 - 106 mmol/L   CO2 23 20 - 29 mmol/L   Calcium 8.9 8.7 - 10.2 mg/dL   Total Protein 6.4 6.0 - 8.5 g/dL   Albumin 4.4 3.8 - 4.8 g/dL   Globulin, Total 2.0 1.5 - 4.5 g/dL   Albumin/Globulin Ratio 2.2 1.2 - 2.2   Bilirubin Total 0.3 0.0 - 1.2 mg/dL   Alkaline Phosphatase 57 39 - 117 IU/L   AST 17 0 - 40 IU/L   ALT 13 0 - 32 IU/L  Lipid Panel w/o Chol/HDL Ratio  Result Value Ref Range   Cholesterol, Total 153 100 - 199 mg/dL   Triglycerides 74 0 - 149 mg/dL   HDL 59 >39 mg/dL   VLDL Cholesterol Cal 14 5 - 40 mg/dL   LDL Chol Calc (NIH) 80 0 - 99 mg/dL  TSH  Result Value Ref Range   TSH 2.950 0.450 - 4.500 uIU/mL  UA/M w/rflx Culture, Routine   Specimen: Urine   URINE  Result Value Ref Range   Specific Gravity, UA >1.030 (H) 1.005 - 1.030   pH, UA 5.0 5.0 - 7.5   Color, UA Yellow Yellow   Appearance Ur Clear Clear   Leukocytes,UA Negative Negative   Protein,UA Negative Negative/Trace   Glucose, UA Negative Negative   Ketones, UA Negative Negative   RBC, UA 1+ (A) Negative   Bilirubin, UA Negative Negative   Urobilinogen, Ur 0.2 0.2 - 1.0  mg/dL   Nitrite, UA Negative Negative   Microscopic Examination See below:   Hgb A1c w/o eAG  Result Value Ref Range   Hgb A1c MFr Bld 5.8 (H) 4.8 - 5.6 %      Assessment & Plan:   Problem List Items Addressed This Visit      Endocrine   IFG (impaired fasting glucose) - Primary    Rechecking labs today. Await results. Treat as needed.       Relevant Orders   Bayer DCA Hb A1c Waived     Other   High cholesterol    Under good control on current regimen. Continue current regimen. Continue to monitor. Call with any concerns. Refills given. Labs drawn today.       Relevant Medications   pravastatin (PRAVACHOL) 20 MG tablet   Other Relevant Orders   Comprehensive metabolic panel   Lipid Panel w/o Chol/HDL Ratio   Vitamin D deficiency    Rechecking labs today. Await results. Treat as needed.       Relevant Orders   VITAMIN D 25 Hydroxy (Vit-D Deficiency, Fractures)   Anemia  Rechecking labs today. Await results. Treat as needed.       Relevant Orders   CBC with Differential/Platelet    Other Visit Diagnoses    Acute mucoid otitis media of left ear       Will treat with amoxicillin and prednisone. Recheck 2 weeks. Call with any concerns.    Relevant Medications   amoxicillin (AMOXIL) 500 MG capsule       Follow up plan: Return in about 2 weeks (around 01/29/2020) for ear recheck.

## 2020-01-16 LAB — CBC WITH DIFFERENTIAL/PLATELET
Basophils Absolute: 0.1 10*3/uL (ref 0.0–0.2)
Basos: 1 %
EOS (ABSOLUTE): 0.1 10*3/uL (ref 0.0–0.4)
Eos: 1 %
Hematocrit: 38.8 % (ref 34.0–46.6)
Hemoglobin: 12.6 g/dL (ref 11.1–15.9)
Immature Grans (Abs): 0 10*3/uL (ref 0.0–0.1)
Immature Granulocytes: 0 %
Lymphocytes Absolute: 2.3 10*3/uL (ref 0.7–3.1)
Lymphs: 29 %
MCH: 28.4 pg (ref 26.6–33.0)
MCHC: 32.5 g/dL (ref 31.5–35.7)
MCV: 87 fL (ref 79–97)
Monocytes Absolute: 0.6 10*3/uL (ref 0.1–0.9)
Monocytes: 7 %
Neutrophils Absolute: 4.8 10*3/uL (ref 1.4–7.0)
Neutrophils: 62 %
Platelets: 253 10*3/uL (ref 150–450)
RBC: 4.44 x10E6/uL (ref 3.77–5.28)
RDW: 13.9 % (ref 11.7–15.4)
WBC: 7.8 10*3/uL (ref 3.4–10.8)

## 2020-01-16 LAB — COMPREHENSIVE METABOLIC PANEL
ALT: 13 IU/L (ref 0–32)
AST: 11 IU/L (ref 0–40)
Albumin/Globulin Ratio: 2.2 (ref 1.2–2.2)
Albumin: 4.6 g/dL (ref 3.8–4.8)
Alkaline Phosphatase: 53 IU/L (ref 44–121)
BUN/Creatinine Ratio: 18 (ref 9–23)
BUN: 16 mg/dL (ref 6–24)
Bilirubin Total: 0.3 mg/dL (ref 0.0–1.2)
CO2: 24 mmol/L (ref 20–29)
Calcium: 9.4 mg/dL (ref 8.7–10.2)
Chloride: 102 mmol/L (ref 96–106)
Creatinine, Ser: 0.89 mg/dL (ref 0.57–1.00)
GFR calc Af Amer: 89 mL/min/{1.73_m2} (ref >59)
GFR calc non Af Amer: 77 mL/min/{1.73_m2} (ref >59)
Globulin, Total: 2.1 g/dL (ref 1.5–4.5)
Glucose: 97 mg/dL (ref 65–99)
Potassium: 4.7 mmol/L (ref 3.5–5.2)
Sodium: 138 mmol/L (ref 134–144)
Total Protein: 6.7 g/dL (ref 6.0–8.5)

## 2020-01-16 LAB — LIPID PANEL W/O CHOL/HDL RATIO
Cholesterol, Total: 162 mg/dL (ref 100–199)
HDL: 56 mg/dL (ref >39)
LDL Chol Calc (NIH): 92 mg/dL (ref 0–99)
Triglycerides: 71 mg/dL (ref 0–149)
VLDL Cholesterol Cal: 14 mg/dL (ref 5–40)

## 2020-01-16 LAB — VITAMIN D 25 HYDROXY (VIT D DEFICIENCY, FRACTURES): Vit D, 25-Hydroxy: 48.9 ng/mL (ref 30.0–100.0)

## 2020-01-26 ENCOUNTER — Encounter: Payer: Self-pay | Admitting: Family Medicine

## 2020-01-29 ENCOUNTER — Ambulatory Visit (INDEPENDENT_AMBULATORY_CARE_PROVIDER_SITE_OTHER): Payer: 59 | Admitting: Family Medicine

## 2020-01-29 ENCOUNTER — Encounter: Payer: Self-pay | Admitting: Family Medicine

## 2020-01-29 ENCOUNTER — Other Ambulatory Visit: Payer: Self-pay

## 2020-01-29 VITALS — BP 123/82 | HR 71 | Temp 98.1°F | Wt 159.0 lb

## 2020-01-29 DIAGNOSIS — B370 Candidal stomatitis: Secondary | ICD-10-CM

## 2020-01-29 DIAGNOSIS — H6591 Unspecified nonsuppurative otitis media, right ear: Secondary | ICD-10-CM

## 2020-01-29 DIAGNOSIS — Z23 Encounter for immunization: Secondary | ICD-10-CM | POA: Diagnosis not present

## 2020-01-29 MED ORDER — PREDNISONE 50 MG PO TABS
50.0000 mg | ORAL_TABLET | Freq: Every day | ORAL | 0 refills | Status: DC
Start: 2020-01-29 — End: 2020-04-13

## 2020-01-29 MED ORDER — MAGIC MOUTHWASH W/LIDOCAINE: 5.0000 mL | Freq: Three times a day (TID) | ORAL | 0 refills | Status: AC | PRN

## 2020-01-29 NOTE — Progress Notes (Signed)
BP 123/82   Pulse 71   Temp 98.1 F (36.7 C) (Oral)   Wt 159 lb (72.1 kg)   SpO2 96%   BMI 28.17 kg/m    Subjective:    Patient ID: Judy Munoz, female    DOB: January 02, 1971, 49 y.o.   MRN: [id number]  HPI: Judy Munoz is a 49 y.o. female  Chief Complaint  Patient presents with  . Ear recheck   EAR PAIN Duration: days Involved ear(s): right Severity:  8/10  Quality:  sharp Fever: no Otorrhea: no Upper respiratory infection symptoms: yes Pruritus: no Hearing loss: no Water immersion no Using Q-tips: no Recurrent otitis media: no Status: stable Treatments attempted: pseudoephedrine, prednisone, abx  Relevant past medical, surgical, family and social history reviewed and updated as indicated. Interim medical history since our last visit reviewed. Allergies and medications reviewed and updated.  Review of Systems  Constitutional: Negative.   HENT: Positive for ear pain and sore throat. Negative for congestion, dental problem, drooling, ear discharge, facial swelling, hearing loss, mouth sores, nosebleeds, postnasal drip, rhinorrhea, sinus pressure, sinus pain, sneezing, tinnitus, trouble swallowing and voice change.   Eyes: Negative.   Respiratory: Negative.   Cardiovascular: Negative.   Gastrointestinal: Negative.   Psychiatric/Behavioral: Negative.     Per HPI unless specifically indicated above     Objective:    BP 123/82   Pulse 71   Temp 98.1 F (36.7 C) (Oral)   Wt 159 lb (72.1 kg)   SpO2 96%   BMI 28.17 kg/m   Wt Readings from Last 3 Encounters:  01/29/20 159 lb (72.1 kg)  01/15/20 159 lb (72.1 kg)  07/14/19 162 lb (73.5 kg)    Physical Exam Vitals and nursing note reviewed.  Constitutional:      General: She is not in acute distress.    Appearance: Normal appearance. She is not ill-appearing, toxic-appearing or diaphoretic.  HENT:     Head: Normocephalic and atraumatic.     Right Ear: Hearing, ear canal and external ear normal. A  middle ear effusion is present.     Left Ear: Hearing, tympanic membrane, ear canal and external ear normal.     Nose: Nose normal.     Mouth/Throat:     Mouth: Mucous membranes are moist.     Pharynx: Oropharynx is clear.  Eyes:     General: No scleral icterus.       Right eye: No discharge.        Left eye: No discharge.     Extraocular Movements: Extraocular movements intact.     Conjunctiva/sclera: Conjunctivae normal.     Pupils: Pupils are equal, round, and reactive to light.  Cardiovascular:     Rate and Rhythm: Normal rate and regular rhythm.     Pulses: Normal pulses.     Heart sounds: Normal heart sounds. No murmur heard.  No friction rub. No gallop.   Pulmonary:     Effort: Pulmonary effort is normal. No respiratory distress.     Breath sounds: Normal breath sounds. No stridor. No wheezing, rhonchi or rales.  Chest:     Chest wall: No tenderness.  Musculoskeletal:        General: Normal range of motion.     Cervical back: Normal range of motion and neck supple.  Skin:    General: Skin is warm and dry.     Capillary Refill: Capillary refill takes less than 2 seconds.     Coloration: Skin is  not jaundiced or pale.     Findings: No bruising, erythema, lesion or rash.  Neurological:     General: No focal deficit present.     Mental Status: She is alert and oriented to person, place, and time. Mental status is at baseline.  Psychiatric:        Mood and Affect: Mood normal.        Behavior: Behavior normal.        Thought Content: Thought content normal.        Judgment: Judgment normal.     Results for orders placed or performed in visit on 01/15/20  Bayer DCA Hb A1c Waived  Result Value Ref Range   HB A1C (BAYER DCA - WAIVED) 5.7 <7.0 %  Comprehensive metabolic panel  Result Value Ref Range   Glucose 97 65 - 99 mg/dL   BUN 16 6 - 24 mg/dL   Creatinine, Ser 0.89 0.57 - 1.00 mg/dL   GFR calc non Af Amer 77 >59 mL/min/1.73   GFR calc Af Amer 89 >59 mL/min/1.73     BUN/Creatinine Ratio 18 9 - 23   Sodium 138 134 - 144 mmol/L   Potassium 4.7 3.5 - 5.2 mmol/L   Chloride 102 96 - 106 mmol/L   CO2 24 20 - 29 mmol/L   Calcium 9.4 8.7 - 10.2 mg/dL   Total Protein 6.7 6.0 - 8.5 g/dL   Albumin 4.6 3.8 - 4.8 g/dL   Globulin, Total 2.1 1.5 - 4.5 g/dL   Albumin/Globulin Ratio 2.2 1.2 - 2.2   Bilirubin Total 0.3 0.0 - 1.2 mg/dL   Alkaline Phosphatase 53 44 - 121 IU/L   AST 11 0 - 40 IU/L   ALT 13 0 - 32 IU/L  CBC with Differential/Platelet  Result Value Ref Range   WBC 7.8 3.4 - 10.8 x10E3/uL   RBC 4.44 3.77 - 5.28 x10E6/uL   Hemoglobin 12.6 11.1 - 15.9 g/dL   Hematocrit 38.8 34.0 - 46.6 %   MCV 87 79 - 97 fL   MCH 28.4 26.6 - 33.0 pg   MCHC 32.5 31 - 35 g/dL   RDW 13.9 11.7 - 15.4 %   Platelets 253 150 - 450 x10E3/uL   Neutrophils 62 Not Estab. %   Lymphs 29 Not Estab. %   Monocytes 7 Not Estab. %   Eos 1 Not Estab. %   Basos 1 Not Estab. %   Neutrophils Absolute 4.8 1 - 7 x10E3/uL   Lymphocytes Absolute 2.3 0 - 3 x10E3/uL   Monocytes Absolute 0.6 0 - 0 x10E3/uL   EOS (ABSOLUTE) 0.1 0.0 - 0.4 x10E3/uL   Basophils Absolute 0.1 0 - 0 x10E3/uL   Immature Granulocytes 0 Not Estab. %   Immature Grans (Abs) 0.0 0.0 - 0.1 x10E3/uL  Lipid Panel w/o Chol/HDL Ratio  Result Value Ref Range   Cholesterol, Total 162 100 - 199 mg/dL   Triglycerides 71 0 - 149 mg/dL   HDL 56 >39 mg/dL   VLDL Cholesterol Cal 14 5 - 40 mg/dL   LDL Chol Calc (NIH) 92 0 - 99 mg/dL  VITAMIN D 25 Hydroxy (Vit-D Deficiency, Fractures)  Result Value Ref Range   Vit D, 25-Hydroxy 48.9 30.0 - 100.0 ng/mL      Assessment & Plan:   Problem List Items Addressed This Visit    None    Visit Diagnoses    Middle ear effusion, right    -  Primary   No infection. Will  treat again with prednisone. Call if not getting better or getting worse.    Thrush       Will treat with magic mouth wash. Call with any concerns or if not getting better.   Relevant Medications   magic  mouthwash w/lidocaine SOLN   Flu vaccine need       Flu shot given today.    Relevant Orders   Flu Vaccine QUAD 36+ mos IM       Follow up plan: Return if symptoms worsen or fail to improve.

## 2020-04-07 ENCOUNTER — Other Ambulatory Visit: Payer: Self-pay | Admitting: Family Medicine

## 2020-04-07 DIAGNOSIS — Z1231 Encounter for screening mammogram for malignant neoplasm of breast: Secondary | ICD-10-CM

## 2020-04-12 ENCOUNTER — Other Ambulatory Visit: Payer: Self-pay

## 2020-04-12 ENCOUNTER — Ambulatory Visit
Admission: EM | Admit: 2020-04-12 | Discharge: 2020-04-12 | Disposition: A | Discharge status: Home / Self Care | Payer: 59 | Attending: Family Medicine | Admitting: Family Medicine

## 2020-04-12 ENCOUNTER — Encounter: Payer: Self-pay | Admitting: Emergency Medicine

## 2020-04-12 ENCOUNTER — Encounter: Payer: Self-pay | Admitting: Family Medicine

## 2020-04-12 DIAGNOSIS — L739 Follicular disorder, unspecified: Secondary | ICD-10-CM

## 2020-04-12 MED ORDER — DOXYCYCLINE HYCLATE 100 MG PO CAPS: 100.0000 mg | ORAL_CAPSULE | Freq: Two times a day (BID) | ORAL | 0 refills | Status: DC

## 2020-04-12 MED ORDER — MUPIROCIN 2 % EX OINT: 1.0000 "application " | TOPICAL_OINTMENT | Freq: Three times a day (TID) | CUTANEOUS | 0 refills | Status: AC

## 2020-04-12 NOTE — Patient Instructions (Addendum)
Preventive Care 40-48 Years Old, Female Preventive care refers to visits with your health care provider and lifestyle choices that can promote health and wellness. This includes:  A yearly physical exam. This may also be called an annual well check.  Regular dental visits and eye exams.  Immunizations.  Screening for certain conditions.  Healthy lifestyle choices, such as eating a healthy diet, getting regular exercise, not using drugs or products that contain nicotine and tobacco, and limiting alcohol use. What can I expect for my preventive care visit? Physical exam Your health care provider will check your:  Height and weight. This may be used to calculate body mass index (BMI), which tells if you are at a healthy weight.  Heart rate and blood pressure.  Skin for abnormal spots. Counseling Your health care provider may ask you questions about your:  Alcohol, tobacco, and drug use.  Emotional well-being.  Home and relationship well-being.  Sexual activity.  Eating habits.  Work and work environment.  Method of birth control.  Menstrual cycle.  Pregnancy history. What immunizations do I need?  Influenza (flu) vaccine  This is recommended every year. Tetanus, diphtheria, and pertussis (Tdap) vaccine  You may need a Td booster every 10 years. Varicella (chickenpox) vaccine  You may need this if you have not been vaccinated. Zoster (shingles) vaccine  You may need this after age 60. Measles, mumps, and rubella (MMR) vaccine  You may need at least one dose of MMR if you were born in 1957 or later. You may also need a second dose. Pneumococcal conjugate (PCV13) vaccine  You may need this if you have certain conditions and were not previously vaccinated. Pneumococcal polysaccharide (PPSV23) vaccine  You may need one or two doses if you smoke cigarettes or if you have certain conditions. Meningococcal conjugate (MenACWY) vaccine  You may need this if you  have certain conditions. Hepatitis A vaccine  You may need this if you have certain conditions or if you travel or work in places where you may be exposed to hepatitis A. Hepatitis B vaccine  You may need this if you have certain conditions or if you travel or work in places where you may be exposed to hepatitis B. Haemophilus influenzae type b (Hib) vaccine  You may need this if you have certain conditions. Human papillomavirus (HPV) vaccine  If recommended by your health care provider, you may need three doses over 6 months. You may receive vaccines as individual doses or as more than one vaccine together in one shot (combination vaccines). Talk with your health care provider about the risks and benefits of combination vaccines. What tests do I need? Blood tests  Lipid and cholesterol levels. These may be checked every 5 years, or more frequently if you are over 50 years old.  Hepatitis C test.  Hepatitis B test. Screening  Lung cancer screening. You may have this screening every year starting at age 55 if you have a 30-pack-year history of smoking and currently smoke or have quit within the past 15 years.  Colorectal cancer screening. All adults should have this screening starting at age 50 and continuing until age 75. Your health care provider may recommend screening at age 45 if you are at increased risk. You will have tests every 1-10 years, depending on your results and the type of screening test.  Diabetes screening. This is done by checking your blood sugar (glucose) after you have not eaten for a while (fasting). You may have this   done every 1-3 years.  Mammogram. This may be done every 1-2 years. Talk with your health care provider about when you should start having regular mammograms. This may depend on whether you have a family history of breast cancer.  BRCA-related cancer screening. This may be done if you have a family history of breast, ovarian, tubal, or peritoneal  cancers.  Pelvic exam and Pap test. This may be done every 3 years starting at age 70. Starting at age 87, this may be done every 5 years if you have a Pap test in combination with an HPV test. Other tests  Sexually transmitted disease (STD) testing.  Bone density scan. This is done to screen for osteoporosis. You may have this scan if you are at high risk for osteoporosis. Follow these instructions at home: Eating and drinking  Eat a diet that includes fresh fruits and vegetables, whole grains, lean protein, and low-fat dairy.  Take vitamin and mineral supplements as recommended by your health care provider.  Do not drink alcohol if: ? Your health care provider tells you not to drink. ? You are pregnant, may be pregnant, or are planning to become pregnant.  If you drink alcohol: ? Limit how much you have to 0-1 drink a day. ? Be aware of how much alcohol is in your drink. In the [place] one drink equals one 12 oz bottle of beer (355 mL), one 5 oz glass of wine (148 mL), or one 1 oz glass of hard liquor (44 mL). Lifestyle  Take daily care of your teeth and gums.  Stay active. Exercise for at least 30 minutes on 5 or more days each week.  Do not use any products that contain nicotine or tobacco, such as cigarettes, e-cigarettes, and chewing tobacco. If you need help quitting, ask your health care provider.  If you are sexually active, practice safe sex. Use a condom or other form of birth control (contraception) in order to prevent pregnancy and STIs (sexually transmitted infections).  If told by your health care provider, take low-dose aspirin daily starting at age 59. What's next?  Visit your health care provider once a year for a well check visit.  Ask your health care provider how often you should have your eyes and teeth checked.  Stay up to date on all vaccines. This information is not intended to replace advice given to you by your health care provider. Make sure you  discuss any questions you have with your health care provider. Document Revised: 12/27/2017 Document Reviewed: 12/27/2017 Elsevier Patient Education  2020 [employer] Breast self-awareness is knowing how your breasts look and feel. Doing breast self-awareness is important. It allows you to catch a breast problem early while it is still small and can be treated. All women should do breast self-awareness, including women who have had breast implants. Tell your doctor if you notice a change in your breasts. What you need:  A mirror.  A well-lit room. How to do a breast self-exam A breast self-exam is one way to learn what is normal for your breasts and to check for changes. To do a breast self-exam: Look for changes  1. Take off all the clothes above your waist. 2. Stand in front of a mirror in a room with good lighting. 3. Put your hands on your hips. 4. Push your hands down. 5. Look at your breasts and nipples in the mirror to see if one breast or nipple looks different  other. Check to see if: ? The shape of one breast is different. ? The size of one breast is different. ? There are wrinkles, dips, and bumps in one breast and not the other. 6. Look at each breast for changes in the skin, such as: ? Redness. ? Scaly areas. 7. Look for changes in your nipples, such as: ? Liquid around the nipples. ? Bleeding. ? Dimpling. ? Redness. ? A change in where the nipples are. Feel for changes  1. Lie on your back on the floor. 2. Feel each breast. To do this, follow these steps: ? Pick a breast to feel. ? Put the arm closest to that breast above your head. ? Use your other arm to feel the nipple area of your breast. Feel the area with the pads of your three middle fingers by making small circles with your fingers. For the first circle, press lightly. For the second circle, press harder. For the third circle, press even harder. ? Keep making circles with  your fingers at the different pressures as you move down your breast. Stop when you feel your ribs. ? Move your fingers a little toward the center of your body. ? Start making circles with your fingers again, this time going up until you reach your collarbone. ? Keep making up-and-down circles until you reach your armpit. Remember to keep using the three pressures. ? Feel the other breast in the same way. 3. Sit or stand in the tub or shower. 4. With soapy water on your skin, feel each breast the same way you did in step 2 when you were lying on the floor. Write down what you find Writing down what you find can help you remember what to tell your doctor. Write down:  What is normal for each breast.  Any changes you find in each breast, including: ? The kind of changes you find. ? Whether you have pain. ? Size and location of any lumps.  When you last had your menstrual period. General tips  Check your breasts every month.  If you are breastfeeding, the best time to check your breasts is after you feed your baby or after you use a breast pump.  If you get menstrual periods, the best time to check your breasts is 5-7 days after your menstrual period is over.  With time, you will become comfortable with the self-exam, and you will begin to know if there are changes in your breasts. Contact a doctor if you:  See a change in the shape or size of your breasts or nipples.  See a change in the skin of your breast or nipples, such as red or scaly skin.  Have fluid coming from your nipples that is not normal.  Find a lump or thick area that was not there before.  Have pain in your breasts.  Have any concerns about your breast health. Summary  Breast self-awareness includes looking for changes in your breasts, as well as feeling for changes within your breasts.  Breast self-awareness should be done in front of a mirror in a well-lit room.  You should check your breasts every month.  If you get menstrual periods, the best time to check your breasts is 5-7 days after your menstrual period is over.  Let your doctor know of any changes you see in your breasts, including changes in size, changes on the skin, pain or tenderness, or fluid from your nipples that is not normal. This information is not   is not intended to replace advice given to you by your health care provider. Make sure you discuss any questions you have with your health care provider. Document Revised: 12/04/2017 Document Reviewed: 12/04/2017 Elsevier Patient Education  [employer].

## 2020-04-12 NOTE — Progress Notes (Signed)
Annual Exam-Pt that she was doing well. Pt has a rash on her sides and were treated at urgent care for the rash.

## 2020-04-12 NOTE — ED Provider Notes (Signed)
[facility]    CSN: [id number] Arrival date & time: 04/12/20  1023  History   Chief Complaint Chief Complaint  Patient presents with  . Rash   HPI   49 year old female presents with rash.  Patient reports that her rash started abruptly this morning.  She states that she had some soreness of her back yesterday.  Woke up with rash which has been painful.  Located on both sides of the back/flank.  No itching.  No inciting event.  Rash is raised and red.  She has 1 on her right forearm and also has a few on her lower abdomen.  No relieving factors.  No medications or interventions tried.  No other associated symptoms.  No other complaints.  Past Medical History:  Diagnosis Date  . Allergy Seasonal  . Anemia    H/O WITH PREGNANCY  . Asthma   . Biliary dyskinesia   . Elevated glucose   . GERD (gastroesophageal reflux disease)   . Headache    MIGRAINES RARE  . Heart murmur    WAS TOLD THIS ONCE YEARS AGO-HAS NEVER BEEN TOLD SINCE  . High cholesterol   . History of uterine fibroid   . IFG (impaired fasting glucose)   . PONV (postoperative nausea and vomiting)    HAPPENED WITH HER 1ST EGD- HER 2ND EGD SHE WAS GIVEN ANTI-EMETIC IV AND HAD NO N/V  . Trapezius muscle strain   . Vitamin D deficiency     Patient Active Problem List   Diagnosis Date Noted  . Reactive airway disease 01/13/2019  . Scarring, keloid 06/20/2017  . Anemia   . Heart murmur   . Gastritis   . Overweight (BMI 25.0-29.9) 03/23/2015  . GERD (gastroesophageal reflux disease)   . High cholesterol   . Vitamin D deficiency   . IFG (impaired fasting glucose)     Past Surgical History:  Procedure Laterality Date  . CHOLECYSTECTOMY N/A 07/18/2016   Procedure: LAPAROSCOPIC CHOLECYSTECTOMY;  Surgeon: Jules Husbands, MD;  Location: [facility];  Service: General;  Laterality: N/A;  . CHOLECYSTECTOMY, LAPAROSCOPIC  07/18/2016  . ESOPHAGOGASTRODUODENOSCOPY    . ESOPHAGOGASTRODUODENOSCOPY (EGD) WITH  PROPOFOL N/A 07/26/2015   Procedure: ESOPHAGOGASTRODUODENOSCOPY (EGD) WITH PROPOFOL;  Surgeon: Lucilla Lame, MD;  Location: [place];  Service: Endoscopy;  Laterality: N/A;    OB History    Gravida  1   Para  1   Term  1   Preterm      AB      Living  1     SAB      IAB      Ectopic      Multiple      Live Births  1            Home Medications    Prior to Admission medications   Medication Sig Start Date End Date Taking? Authorizing Provider  albuterol (VENTOLIN HFA) 108 (90 Base) MCG/ACT inhaler TAKE 2 PUFFS BY MOUTH EVERY 6 HOURS AS NEEDED FOR WHEEZE OR SHORTNESS OF BREATH 01/15/20  Yes Johnson, Megan P, DO  cetirizine (ZYRTEC) 10 MG tablet Take 10 mg by mouth daily.   Yes [provider]  Cholecalciferol (VITAMIN D-400 PO) Take 800 mg by mouth.    Yes [provider]  fluticasone (FLONASE) 50 MCG/ACT nasal spray Place into the nose. 04/02/17  Yes [provider]  levonorgestrel (MIRENA) 20 MCG/24HR IUD by Intrauterine route once.  05/08/19  Yes [provider]  naproxen (NAPROSYN) 500 MG tablet Take 1 tablet (500 mg total) by mouth 2 (two) times daily with a meal. As needed. 01/15/20  Yes Johnson, Megan P, DO  pantoprazole (PROTONIX) 40 MG tablet Take 1 tablet (40 mg total) by mouth 2 (two) times daily. 01/15/20  Yes Johnson, Megan P, DO  pravastatin (PRAVACHOL) 20 MG tablet TAKE 1 TABLET BY MOUTH EVERYDAY AT BEDTIME 01/15/20  Yes Johnson, Megan P, DO  doxycycline (VIBRAMYCIN) 100 MG capsule Take 1 capsule (100 mg total) by mouth 2 (two) times daily. 04/12/20   Coral Spikes, DO  mupirocin ointment (BACTROBAN) 2 % Apply 1 application topically 3 (three) times daily for 7 days. 04/12/20 04/19/20  Coral Spikes, DO  predniSONE (DELTASONE) 50 MG tablet Take 1 tablet (50 mg total) by mouth daily with breakfast. 01/29/20   Valerie Roys, DO    Family History Family History  Problem Relation Age of Onset  . Diabetes Father    . Heart disease Father   . Hypertension Father   . Hyperlipidemia Father   . Thyroid disease Mother   . Cancer Maternal Grandmother        uterine  . Breast cancer Maternal Grandmother 67  . Thyroid disease Maternal Grandmother   . Lung cancer Maternal Uncle     Social History Social History   Tobacco Use  . Smoking status: Never Smoker  . Smokeless tobacco: Never Used  Vaping Use  . Vaping Use: Never used  Substance Use Topics  . Alcohol use: No  . Drug use: No     Allergies   Sulfa antibiotics   Review of Systems Review of Systems  Constitutional: Negative.   Skin: Positive for rash.   Physical Exam Triage Vital Signs ED Triage Vitals  Enc Vitals Group     BP 04/12/20 1059 133/85     Pulse Rate 04/12/20 1059 74     Resp 04/12/20 1059 18     Temp 04/12/20 1059 98.4 F (36.9 C)     Temp Source 04/12/20 1059 Oral     SpO2 04/12/20 1059 100 %     Weight 04/12/20 1057 158 lb (71.7 kg)     Height 04/12/20 1057 5' 3"  (1.6 m)     Head Circumference --      Peak Flow --      Pain Score 04/12/20 1057 3     Pain Loc --      Pain Edu? --      Excl. in [place]? --    Updated Vital Signs BP 133/85 (BP Location: Right Arm)   Pulse 74   Temp 98.4 F (36.9 C) (Oral)   Resp 18   Ht 5' 3"  (1.6 m)   Wt 71.7 kg   SpO2 100%   BMI 27.99 kg/m   Visual Acuity Right Eye Distance:   Left Eye Distance:   Bilateral Distance:    Right Eye Near:   Left Eye Near:    Bilateral Near:     Physical Exam Vitals and nursing note reviewed.  Constitutional:      General: She is not in acute distress.    Appearance: Normal appearance. She is not ill-appearing.  HENT:     Head: Normocephalic and atraumatic.  Pulmonary:     Effort: Pulmonary effort is normal.     Breath sounds: Normal breath sounds. No wheezing, rhonchi or rales.  Skin:         Comments: Raised erythematous pustules noted  at the labelled locations.  Neurological:     Mental Status: She is alert.   Psychiatric:        Mood and Affect: Mood normal.        Behavior: Behavior normal.    UC Treatments / Results  Labs (all labs ordered are listed, but only abnormal results are displayed) Labs Reviewed - No data to display  EKG   Radiology No results found.  Procedures Procedures (including critical care time)  Medications Ordered in UC Medications - No data to display  Initial Impression / Assessment and Plan / UC Course  I have reviewed the triage vital signs and the nursing notes.  Pertinent labs & imaging results that were available during my care of the patient were reviewed by me and considered in my medical decision making (see chart for details).    49 year old female presents with suspected folliculitis.  Treating with doxycycline and Bactroban ointment.  Final Clinical Impressions(s) / UC Diagnoses   Final diagnoses:  Folliculitis   Discharge Instructions   None    ED Prescriptions    Medication Sig Dispense Auth. Provider   doxycycline (VIBRAMYCIN) 100 MG capsule Take 1 capsule (100 mg total) by mouth 2 (two) times daily. 14 capsule Tayler Heiden G, DO   mupirocin ointment (BACTROBAN) 2 % Apply 1 application topically 3 (three) times daily for 7 days. 30 g Coral Spikes, DO     PDMP not reviewed this encounter.   Coral Spikes, DO 04/12/20 1139

## 2020-04-12 NOTE — ED Triage Notes (Signed)
Patient states she woke up this morning with a rash on her abdomen and side. She states her back has been sore. She reports the rash does not itch but it is painful.

## 2020-04-13 ENCOUNTER — Other Ambulatory Visit (HOSPITAL_COMMUNITY)
Admission: RE | Admit: 2020-04-13 | Discharge: 2020-04-13 | Disposition: A | Discharge status: Home / Self Care | Payer: 59 | Source: Ambulatory Visit | Attending: Obstetrics and Gynecology | Admitting: Obstetrics and Gynecology

## 2020-04-13 ENCOUNTER — Encounter: Payer: Self-pay | Admitting: Obstetrics and Gynecology

## 2020-04-13 ENCOUNTER — Ambulatory Visit (INDEPENDENT_AMBULATORY_CARE_PROVIDER_SITE_OTHER): Payer: 59 | Admitting: Obstetrics and Gynecology

## 2020-04-13 VITALS — BP 130/80 | HR 74 | Ht 63.0 in | Wt 157.6 lb

## 2020-04-13 DIAGNOSIS — Z1211 Encounter for screening for malignant neoplasm of colon: Secondary | ICD-10-CM | POA: Diagnosis not present

## 2020-04-13 DIAGNOSIS — Z01419 Encounter for gynecological examination (general) (routine) without abnormal findings: Secondary | ICD-10-CM | POA: Diagnosis not present

## 2020-04-13 DIAGNOSIS — Z975 Presence of (intrauterine) contraceptive device: Secondary | ICD-10-CM

## 2020-04-13 DIAGNOSIS — Z8742 Personal history of other diseases of the female genital tract: Secondary | ICD-10-CM

## 2020-04-13 DIAGNOSIS — E785 Hyperlipidemia, unspecified: Secondary | ICD-10-CM

## 2020-04-13 DIAGNOSIS — Z124 Encounter for screening for malignant neoplasm of cervix: Secondary | ICD-10-CM | POA: Diagnosis present

## 2020-04-13 DIAGNOSIS — R21 Rash and other nonspecific skin eruption: Secondary | ICD-10-CM

## 2020-04-13 NOTE — Progress Notes (Signed)
GYNECOLOGY ANNUAL PHYSICAL EXAM PROGRESS NOTE  Subjective:    Judy Munoz is a 49 y.o. G34P1001 married female with past history of fibroid uterus who presents for an annual exam.  The patient is sexually active.  The patient wears seatbelts: yes. The patient participates in regular exercise: occasional walking.     The patient has the following complaints today:  1.  She reports a rash that began to appear yesterday. Rash appeared on abdomen and wrapped around her backside. Also noting a few areas on her right arm. She states that she presented to Urgent Care after not being able to get in to see her PCP. Reports that they diagnosed her with folliculitis, and prescribed Bactroban ointment and Doxycyline tablets. Was ruled out for shingles.    Gynecologic History Menarche age: 65 Patient's last menstrual period was 03/27/2020.  Notes cycles have significantly decreased in flow, occasionally skips a month.  Contraception: Mirena IUD (inserted 05/08/2019) History of STI's: Denies Last Pap: 03/2017. Results were: normal.  Denies h/o abnormal pap smears. Last mammogram: 05/06/2017. Results were: Normal. BIRADS 1.    OB History  Gravida Para Term Preterm AB Living  1 1 1  0 0 1  SAB IAB Ectopic Multiple Live Births  0 0 0 0 1    # Outcome Date GA Lbr Len/2nd Weight Sex Delivery Anes PTL Lv  1 Term 2014 [redacted]w[redacted]d 5 lb 12 oz (2.608 kg) F Vag-Spont EPI N LIV     Name: LMendel Ryder    Past Medical History:  Diagnosis Date  . Allergy Seasonal  . Anemia    H/O WITH PREGNANCY  . Asthma   . Biliary dyskinesia   . Elevated glucose   . GERD (gastroesophageal reflux disease)   . Headache    MIGRAINES RARE  . Heart murmur    WAS TOLD THIS ONCE YEARS AGO-HAS NEVER BEEN TOLD SINCE  . High cholesterol   . History of uterine fibroid   . IFG (impaired fasting glucose)   . PONV (postoperative nausea and vomiting)    HAPPENED WITH HER 1ST EGD- HER 2ND EGD SHE WAS GIVEN ANTI-EMETIC IV AND HAD NO  N/V  . Trapezius muscle strain   . Vitamin D deficiency     Past Surgical History:  Procedure Laterality Date  . CHOLECYSTECTOMY N/A 07/18/2016   Procedure: LAPAROSCOPIC CHOLECYSTECTOMY;  Surgeon: DJules Husbands MD;  Location: [facility];  Service: General;  Laterality: N/A;  . CHOLECYSTECTOMY, LAPAROSCOPIC  07/18/2016  . ESOPHAGOGASTRODUODENOSCOPY    . ESOPHAGOGASTRODUODENOSCOPY (EGD) WITH PROPOFOL N/A 07/26/2015   Procedure: ESOPHAGOGASTRODUODENOSCOPY (EGD) WITH PROPOFOL;  Surgeon: DLucilla Lame MD;  [place]  Service: Endoscopy;  Laterality: N/A;    Family History  Problem Relation Age of Onset  . Diabetes Father   . Heart disease Father   . Hypertension Father   . Hyperlipidemia Father   . Thyroid disease Mother   . Cancer Maternal Grandmother        uterine  . Breast cancer Maternal Grandmother [id number] . Thyroid disease Maternal Grandmother   . Lung cancer Maternal Uncle     Social History   Socioeconomic History  . Marital status: Married    Spouse name: Not on file  . Number of children: Not on file  . Years of education: Not on file  . Highest education level: Not on file  Occupational History  . Not on file  Tobacco Use  . Smoking status: Never  Smoker  . Smokeless tobacco: Never Used  Vaping Use  . Vaping Use: Never used  Substance and Sexual Activity  . Alcohol use: No  . Drug use: No  . Sexual activity: Yes    Birth control/protection: I.U.D.  Other Topics Concern  . Not on file  Social History Narrative  . Not on file   Social Determinants of Health   Financial Resource Strain: Not on file  Food Insecurity: Not on file  Transportation Needs: Not on file  Physical Activity: Not on file  Stress: Not on file  Social Connections: Not on file  Intimate Partner Violence: Not on file    Current Outpatient Medications on File Prior to Visit  Medication Sig Dispense Refill  . albuterol (VENTOLIN HFA) 108 (90 Base) MCG/ACT inhaler TAKE  2 PUFFS BY MOUTH EVERY 6 HOURS AS NEEDED FOR WHEEZE OR SHORTNESS OF BREATH 18 g 6  . cetirizine (ZYRTEC) 10 MG tablet Take 10 mg by mouth daily.    . Cholecalciferol (VITAMIN D-400 PO) Take 800 mg by mouth.     . doxycycline (VIBRAMYCIN) 100 MG capsule Take 1 capsule (100 mg total) by mouth 2 (two) times daily. 14 capsule 0  . fluticasone (FLONASE) 50 MCG/ACT nasal spray Place into the nose.    . levonorgestrel (MIRENA) 20 MCG/24HR IUD by Intrauterine route once.     . mupirocin ointment (BACTROBAN) 2 % Apply 1 application topically 3 (three) times daily for 7 days. 30 g 0  . naproxen (NAPROSYN) 500 MG tablet Take 1 tablet (500 mg total) by mouth 2 (two) times daily with a meal. As needed. 180 tablet 1  . pantoprazole (PROTONIX) 40 MG tablet Take 1 tablet (40 mg total) by mouth 2 (two) times daily. 180 tablet 1  . pravastatin (PRAVACHOL) 20 MG tablet TAKE 1 TABLET BY MOUTH EVERYDAY AT BEDTIME 90 tablet 1   No current facility-administered medications on file prior to visit.    Allergies  Allergen Reactions  . Sulfa Antibiotics Hives    Review of Systems Constitutional: negative for chills, fevers and sweats.  Eyes: negative for irritation, redness and visual disturbance Ears, nose, mouth, throat, and face: negative for hearing loss, nasal congestion, snoring and tinnitus Respiratory: negative for asthma, cough, sputum Cardiovascular: negative for chest pain, dyspnea, exertional chest pressure/discomfort, irregular heart beat, palpitations and syncope Gastrointestinal: negative for abdominal pain, change in bowel habits, nausea and vomiting Genitourinary:  Negative for abnormal menstrual periods, genital lesions, sexual problems and vaginal discharge, dysuria and urinary incontinence Integument/breast: negative for breast lump, breast tenderness and nipple discharge Hematologic/lymphatic: negative for bleeding and easy bruising Musculoskeletal:  Negative for muscle weakness or back  pain Neurological: negative for dizziness, headaches, vertigo and weakness Endocrine: negative for diabetic symptoms including polydipsia, polyuria and skin dryness Allergic/Immunologic: negative for hay fever and urticaria Dermatological ROS: positive for rash. Negative for dry skin and pruritus     Objective:  Blood pressure 130/80, pulse 74, height 5' 3"  (1.6 m), weight 157 lb 9.6 oz (71.5 kg), last menstrual period 03/27/2020. Body mass index is 27.92 kg/m.  General Appearance:    Alert, cooperative, no distress, appears stated age, overweight  Head:    Normocephalic, without obvious abnormality, atraumatic  Eyes:    PERRL, conjunctiva/corneas clear, EOM's intact, both eyes  Ears:    Normal external ear canals, both ears  Nose:   Nares normal, septum midline, mucosa normal, no drainage or sinus tenderness  Throat:   Lips, mucosa, and  tongue normal; teeth and gums normal  Neck:   Supple, symmetrical, trachea midline, no adenopathy; thyroid: no enlargement/tenderness/nodules; no carotid bruit or JVD  Back:     Symmetric, no curvature, ROM normal, no CVA tenderness  Lungs:     Clear to auscultation bilaterally, respirations unlabored  Chest Wall:    No tenderness or deformity   Heart:    Regular rate and rhythm, S1 and S2 normal, no murmur, rub or gallop  Breast Exam:    No tenderness, masses, or nipple abnormality  Abdomen:     Soft, non-tender, bowel sounds active all four quadrants, no masses, no organomegaly.    Genitalia:    Pelvic:external genitalia normal, vagina without lesions, discharge, or tenderness, rectovaginal septum  normal. Cervix normal in appearance, no cervical motion tenderness, IUD threads visible, ~ 3 cm in length. No adnexal masses or tenderness.  Uterus normal size, shape, mobile, regular contours, nontender.  Rectal:    Normal external sphincter.  No hemorrhoids appreciated. Internal exam not done.   Extremities:   Extremities normal, atraumatic, no cyanosis or  edema  Pulses:   2+ and symmetric all extremities  Skin:   Skin color, texture, turgor normal. Raised erythematous pustules located on mid to lower trunk, wrapping towards the back, and on right arm.   Lymph nodes:   Cervical, supraclavicular, and axillary nodes normal  Neurologic:   CNII-XII intact, normal strength, sensation and reflexes throughout   .  Labs:  Lab Results  Component Value Date   WBC 7.8 01/15/2020   HGB 12.6 01/15/2020   HCT 38.8 01/15/2020   MCV 87 01/15/2020   PLT 253 01/15/2020    Lab Results  Component Value Date   CREATININE 0.89 01/15/2020   BUN 16 01/15/2020   NA 138 01/15/2020   K 4.7 01/15/2020   CL 102 01/15/2020   CO2 24 01/15/2020    Lab Results  Component Value Date   ALT 13 01/15/2020   AST 11 01/15/2020   ALKPHOS 53 01/15/2020   BILITOT 0.3 01/15/2020    Lab Results  Component Value Date   TSH 2.950 07/14/2019    Lab Results  Component Value Date   CHOL 162 01/15/2020   HDL 56 01/15/2020   LDLCALC 92 01/15/2020   TRIG 71 01/15/2020   CHOLHDL 3.9 03/02/2015    Lab Results  Component Value Date   HGBA1C 5.7 01/15/2020     Assessment:   Healthy female exam. History of menorrhagia IUD in place Fibroid uterus Dyslipidemia Skin rash  Plan:    - Blood tests: Up to date. Performed in September.  - Breast self exam technique reviewed and patient encouraged to perform self-exam monthly. - Contraception: IUD in place.  - Discussed healthy lifestyle modifications. - Mammogram scheduled for January. - Discussed colon cancer screening, new recommendations for screening beginning at age 48. Advised on options for screening including FOBT, Cologuard, or colonoscopy. Patient desires to do Cologuard for now. Will order.  - IUD in place for perimenopausal bleeding. Doing well, notes occasional cycle but much lighter.  - Flu vaccine up to date.  - COVID testing: up to date. For [employer] vaccine booster when eligible.  - Discussed   - Skin rash, unclear trigger. Advised to continue use of the Doxycyline, but would recommend use of a steroid cream. Patient reports her daughter has Triamcinolone cream used for eczema, advised that she could utilize this.  - FIbroid uterus likely stable, no increase in uterine size over  the past year.  - Menorrhagia currently managed with Mirena IUD.  - Follow up in 1 year.    Rubie Maid, MD [facility]

## 2020-04-14 ENCOUNTER — Encounter: Payer: Self-pay | Admitting: Obstetrics and Gynecology

## 2020-04-15 ENCOUNTER — Other Ambulatory Visit: Payer: Self-pay

## 2020-04-15 ENCOUNTER — Ambulatory Visit
Admission: EM | Admit: 2020-04-15 | Discharge: 2020-04-15 | Disposition: A | Discharge status: Home / Self Care | Payer: 59 | Attending: Family Medicine | Admitting: Family Medicine

## 2020-04-15 DIAGNOSIS — R21 Rash and other nonspecific skin eruption: Secondary | ICD-10-CM

## 2020-04-15 MED ORDER — VALACYCLOVIR HCL 1 G PO TABS: 1000.0000 mg | ORAL_TABLET | Freq: Three times a day (TID) | ORAL | 0 refills | Status: DC

## 2020-04-15 MED ORDER — HYDROCODONE-ACETAMINOPHEN 5-325 MG PO TABS: 1.0000 | ORAL_TABLET | Freq: Four times a day (QID) | ORAL | 0 refills | Status: DC | PRN

## 2020-04-15 MED ORDER — IBUPROFEN 800 MG PO TABS: 800.0000 mg | ORAL_TABLET | Freq: Three times a day (TID) | ORAL | 0 refills | Status: DC

## 2020-04-15 NOTE — Discharge Instructions (Signed)
Treated you for shingles.  Take the Valtrex as prescribed.  800 mg of ibuprofen every 8 hours along with hydrocodone as needed for more severe pain. Follow up as needed for continued or worsening symptoms

## 2020-04-15 NOTE — ED Provider Notes (Signed)
Judy Munoz    CSN: [id number] Arrival date & time: 04/15/20  1504      History   Chief Complaint Chief Complaint  Patient presents with  . Rash    Generalized rash, pain, chills x 3 days    HPI Judy Munoz is a 49 y.o. female.   Pt is a 49 year old female that presents with rash. Was seen 4 days ago for similar. The rash is worse. She has been taking doxycycline and using Bactroban with no improvement. She has had some associated fever and chills. Mild headache. Described the rash as burning and painful. No fever.      Past Medical History:  Diagnosis Date  . Allergy Seasonal  . Anemia    H/O WITH PREGNANCY  . Asthma   . Biliary dyskinesia   . Elevated glucose   . GERD (gastroesophageal reflux disease)   . Headache    MIGRAINES RARE  . Heart murmur    WAS TOLD THIS ONCE YEARS AGO-HAS NEVER BEEN TOLD SINCE  . High cholesterol   . History of uterine fibroid   . IFG (impaired fasting glucose)   . PONV (postoperative nausea and vomiting)    HAPPENED WITH HER 1ST EGD- HER 2ND EGD SHE WAS GIVEN ANTI-EMETIC IV AND HAD NO N/V  . Trapezius muscle strain   . Vitamin D deficiency     Patient Active Problem List   Diagnosis Date Noted  . Reactive airway disease 01/13/2019  . Scarring, keloid 06/20/2017  . Anemia   . Heart murmur   . Gastritis   . Overweight (BMI 25.0-29.9) 03/23/2015  . GERD (gastroesophageal reflux disease)   . High cholesterol   . Vitamin D deficiency   . IFG (impaired fasting glucose)     Past Surgical History:  Procedure Laterality Date  . CHOLECYSTECTOMY N/A 07/18/2016   Procedure: LAPAROSCOPIC CHOLECYSTECTOMY;  Surgeon: Jules Husbands, MD;  Location: [facility];  Service: General;  Laterality: N/A;  . CHOLECYSTECTOMY, LAPAROSCOPIC  07/18/2016  . ESOPHAGOGASTRODUODENOSCOPY    . ESOPHAGOGASTRODUODENOSCOPY (EGD) WITH PROPOFOL N/A 07/26/2015   Procedure: ESOPHAGOGASTRODUODENOSCOPY (EGD) WITH PROPOFOL;  Surgeon: Lucilla Lame, MD;   Location: [place];  Service: Endoscopy;  Laterality: N/A;    OB History    Gravida  1   Para  1   Term  1   Preterm      AB      Living  1     SAB      IAB      Ectopic      Multiple      Live Births  1            Home Medications    Prior to Admission medications   Medication Sig Start Date End Date Taking? Authorizing Provider  albuterol (VENTOLIN HFA) 108 (90 Base) MCG/ACT inhaler TAKE 2 PUFFS BY MOUTH EVERY 6 HOURS AS NEEDED FOR WHEEZE OR SHORTNESS OF BREATH 01/15/20   Johnson, Megan P, DO  cetirizine (ZYRTEC) 10 MG tablet Take 10 mg by mouth daily.    [provider]  Cholecalciferol (VITAMIN D-400 PO) Take 800 mg by mouth.     [provider]  doxycycline (VIBRAMYCIN) 100 MG capsule Take 1 capsule (100 mg total) by mouth 2 (two) times daily. 04/12/20   Coral Spikes, DO  fluticasone (FLONASE) 50 MCG/ACT nasal spray Place into the nose. 04/02/17   [provider]  HYDROcodone-acetaminophen (NORCO/VICODIN) 5-325 MG tablet  Take 1-2 tablets by mouth every 6 (six) hours as needed. 04/15/20   Loura Halt A, NP  ibuprofen (ADVIL) 800 MG tablet Take 1 tablet (800 mg total) by mouth 3 (three) times daily. 04/15/20   Orvan July, NP  levonorgestrel (MIRENA) 20 MCG/24HR IUD by Intrauterine route once.  05/08/19   [provider]  mupirocin ointment (BACTROBAN) 2 % Apply 1 application topically 3 (three) times daily for 7 days. 04/12/20 04/19/20  Coral Spikes, DO  pantoprazole (PROTONIX) 40 MG tablet Take 1 tablet (40 mg total) by mouth 2 (two) times daily. 01/15/20   Johnson, Megan P, DO  pravastatin (PRAVACHOL) 20 MG tablet TAKE 1 TABLET BY MOUTH EVERYDAY AT BEDTIME 01/15/20   Johnson, Megan P, DO  valACYclovir (VALTREX) 1000 MG tablet Take 1 tablet (1,000 mg total) by mouth 3 (three) times daily. 04/15/20   Orvan July, NP    Family History Family History  Problem Relation Age of Onset  . Diabetes Father   . Heart  disease Father   . Hypertension Father   . Hyperlipidemia Father   . Thyroid disease Mother   . Cancer Maternal Grandmother        uterine  . Breast cancer Maternal Grandmother 38  . Thyroid disease Maternal Grandmother   . Lung cancer Maternal Uncle     Social History Social History   Tobacco Use  . Smoking status: Never Smoker  . Smokeless tobacco: Never Used  Vaping Use  . Vaping Use: Never used  Substance Use Topics  . Alcohol use: No  . Drug use: No     Allergies   Sulfa antibiotics   Review of Systems Review of Systems   Physical Exam Triage Vital Signs ED Triage Vitals  Enc Vitals Group     BP 04/15/20 1517 137/81     Pulse Rate 04/15/20 1517 85     Resp 04/15/20 1517 16     Temp 04/15/20 1517 97.8 F (36.6 C)     Temp Source 04/15/20 1517 Temporal     SpO2 04/15/20 1517 98 %     Weight 04/15/20 1516 157 lb (71.2 kg)     Height --      Head Circumference --      Peak Flow --      Pain Score 04/15/20 1514 4     Pain Loc --      Pain Edu? --      Excl. in [place]? --    No data found.  Updated Vital Signs BP 137/81 (BP Location: Left Arm)   Pulse 85   Temp 97.8 F (36.6 C) (Temporal)   Resp 16   Wt 157 lb (71.2 kg)   LMP 03/27/2020   SpO2 98%   BMI 27.81 kg/m   Visual Acuity Right Eye Distance:   Left Eye Distance:   Bilateral Distance:    Right Eye Near:   Left Eye Near:    Bilateral Near:     Physical Exam Vitals and nursing note reviewed.  Constitutional:      General: She is not in acute distress.    Appearance: Normal appearance. She is not ill-appearing, toxic-appearing or diaphoretic.  HENT:     Head: Normocephalic.     Nose: Nose normal.  Eyes:     Conjunctiva/sclera: Conjunctivae normal.  Pulmonary:     Effort: Pulmonary effort is normal.  Musculoskeletal:        General: Normal range of motion.  Cervical back: Normal range of motion.  Skin:    General: Skin is warm and dry.     Findings: Rash present.      Comments: See picture for detail  Neurological:     Mental Status: She is alert.  Psychiatric:        Mood and Affect: Mood normal.        UC Treatments / Results  Labs (all labs ordered are listed, but only abnormal results are displayed) Labs Reviewed - No data to display  EKG   Radiology No results found.  Procedures Procedures (including critical care time)  Medications Ordered in UC Medications - No data to display  Initial Impression / Assessment and Plan / UC Course  I have reviewed the triage vital signs and the nursing notes.  Pertinent labs & imaging results that were available during my care of the patient were reviewed by me and considered in my medical decision making (see chart for details).     Rash Concern for shingles based on exam. Not consistent with typical shingles due to being bilateral on both flank areas. She has also had some chills, body aches. No fever here today.  Will cover with valtrex. 800 mg of ibuprofen of pain and hydrocodone for severe pain as needed.  Strict return precautions given.  Final Clinical Impressions(s) / UC Diagnoses   Final diagnoses:  Rash and nonspecific skin eruption     Discharge Instructions     Treated you for shingles.  Take the Valtrex as prescribed.  800 mg of ibuprofen every 8 hours along with hydrocodone as needed for more severe pain. Follow up as needed for continued or worsening symptoms     ED Prescriptions    Medication Sig Dispense Auth. Provider   valACYclovir (VALTREX) 1000 MG tablet Take 1 tablet (1,000 mg total) by mouth 3 (three) times daily. 21 tablet Galadriel Shroff A, NP   HYDROcodone-acetaminophen (NORCO/VICODIN) 5-325 MG tablet Take 1-2 tablets by mouth every 6 (six) hours as needed. 12 tablet Alleen Kehm A, NP   ibuprofen (ADVIL) 800 MG tablet Take 1 tablet (800 mg total) by mouth 3 (three) times daily. 21 tablet Klair Leising A, NP     I have reviewed the PDMP during this encounter.    Orvan July, NP 04/16/20 [phone]

## 2020-04-15 NOTE — ED Triage Notes (Signed)
Patient presents to Urgent Care for a follow-up for rash since Monday. Patient reports seen Monday at [place]. Rash has not resolved and medications prescribed for rash has not provided relief. She has noted the rash continue to spread.

## 2020-04-16 ENCOUNTER — Ambulatory Visit: Payer: 59 | Admitting: Nurse Practitioner

## 2020-04-16 LAB — CYTOLOGY - PAP
Comment: NEGATIVE
Diagnosis: NEGATIVE
High risk HPV: NEGATIVE

## 2020-04-27 ENCOUNTER — Encounter: Payer: Self-pay | Admitting: Family Medicine

## 2020-04-28 ENCOUNTER — Encounter: Payer: Self-pay | Admitting: Family Medicine

## 2020-04-28 ENCOUNTER — Other Ambulatory Visit: Payer: Self-pay

## 2020-04-28 ENCOUNTER — Ambulatory Visit (INDEPENDENT_AMBULATORY_CARE_PROVIDER_SITE_OTHER): Payer: 59 | Admitting: Family Medicine

## 2020-04-28 VITALS — BP 119/80 | HR 64 | Temp 98.0°F | Wt 158.2 lb

## 2020-04-28 DIAGNOSIS — B0229 Other postherpetic nervous system involvement: Secondary | ICD-10-CM | POA: Diagnosis not present

## 2020-04-28 DIAGNOSIS — G43009 Migraine without aura, not intractable, without status migrainosus: Secondary | ICD-10-CM

## 2020-04-28 MED ORDER — SUMATRIPTAN SUCCINATE 100 MG PO TABS: 100.0000 mg | ORAL_TABLET | ORAL | 0 refills | Status: DC | PRN

## 2020-04-28 MED ORDER — NORTRIPTYLINE HCL 25 MG PO CAPS: 25.0000 mg | ORAL_CAPSULE | Freq: Every day | ORAL | 1 refills | Status: DC

## 2020-04-28 MED ORDER — KETOROLAC TROMETHAMINE 60 MG/2ML IM SOLN
60.0000 mg | Freq: Once | INTRAMUSCULAR | Status: AC
  Administered 2020-04-28: 60 mg via INTRAMUSCULAR

## 2020-04-28 MED ORDER — VALACYCLOVIR HCL 1 G PO TABS
1000.0000 mg | ORAL_TABLET | Freq: Two times a day (BID) | ORAL | 0 refills | Status: DC
Start: 2020-04-28 — End: 2020-06-08

## 2020-04-28 NOTE — Progress Notes (Signed)
BP 119/80   Pulse 64   Temp 98 F (36.7 C)   Wt 158 lb 3.2 oz (71.8 kg)   SpO2 98%   BMI 28.02 kg/m    Subjective:    Patient ID: Judy Munoz, female    DOB: [birth year], 49 y.o.   MRN: [id number]  HPI: Judy Munoz is a 49 y.o. female  Chief Complaint  Patient presents with  . Headache    Pt states for the past two weeks off and on she has been having migraine headaches  . Herpes Zoster    Pt states she noticed new bumps on her abdomen where the shingles was    She had a rash that they thought was shingles. She has felt better- her rash is drying up, but she notes that she has had a couple of bumps which seem like they are getting worse again. She notes that she had a really bad headache that started when she started the valacyclovir. She was not sure if it caused her headache to be worse.   MIGRAINE Duration: 2 weeks Onset: sudden Severity: severe Quality: sharp and stabbing Frequency: constant Location: behind her L eye  Headache duration: hours Radiation: no Time of day headache occurs: at random Headache status at time of visit: current headache Treatments attempted: Treatments attempted: tylenol, ibuprofen    Aura: no Nausea:  yes Vomiting: no Photophobia:  yes Phonophobia:  no Effect on social functioning:  yes Confusion:  no Gait disturbance/ataxia:  no Behavioral changes:  no Fevers:  no  Relevant past medical, surgical, family and social history reviewed and updated as indicated. Interim medical history since our last visit reviewed. Allergies and medications reviewed and updated.  Review of Systems  Constitutional: Positive for chills and fatigue. Negative for activity change, appetite change, diaphoresis, fever and unexpected weight change.  HENT: Negative.   Respiratory: Negative.   Cardiovascular: Negative.   Gastrointestinal: Negative.   Genitourinary: Negative.   Musculoskeletal: Positive for myalgias. Negative for arthralgias, back  pain, gait problem, joint swelling, neck pain and neck stiffness.  Skin: Positive for rash. Negative for color change, pallor and wound.  Neurological: Positive for headaches. Negative for dizziness, tremors, seizures, syncope, facial asymmetry, speech difficulty, weakness, light-headedness and numbness.  Psychiatric/Behavioral: Negative.     Per HPI unless specifically indicated above     Objective:    BP 119/80   Pulse 64   Temp 98 F (36.7 C)   Wt 158 lb 3.2 oz (71.8 kg)   SpO2 98%   BMI 28.02 kg/m   Wt Readings from Last 3 Encounters:  04/28/20 158 lb 3.2 oz (71.8 kg)  04/15/20 157 lb (71.2 kg)  04/13/20 157 lb 9.6 oz (71.5 kg)    Physical Exam Vitals and nursing note reviewed.  Constitutional:      General: She is not in acute distress.    Appearance: Normal appearance. She is not ill-appearing, toxic-appearing or diaphoretic.  HENT:     Head: Normocephalic and atraumatic.     Right Ear: External ear normal.     Left Ear: External ear normal.     Nose: Nose normal.     Mouth/Throat:     Mouth: Mucous membranes are moist.     Pharynx: Oropharynx is clear.  Eyes:     General: No scleral icterus.       Right eye: No discharge.        Left eye: No discharge.  Extraocular Movements: Extraocular movements intact.     Conjunctiva/sclera: Conjunctivae normal.     Pupils: Pupils are equal, round, and reactive to light.  Cardiovascular:     Rate and Rhythm: Normal rate and regular rhythm.     Pulses: Normal pulses.     Heart sounds: Normal heart sounds. No murmur heard. No friction rub. No gallop.   Pulmonary:     Effort: Pulmonary effort is normal. No respiratory distress.     Breath sounds: Normal breath sounds. No stridor. No wheezing, rhonchi or rales.  Chest:     Chest wall: No tenderness.  Musculoskeletal:        General: Normal range of motion.     Cervical back: Normal range of motion and neck supple.  Skin:    General: Skin is warm and dry.      Capillary Refill: Capillary refill takes less than 2 seconds.     Coloration: Skin is not jaundiced or pale.     Findings: Rash (dry vesicular rash on R abdomen) present. No bruising, erythema or lesion.  Neurological:     General: No focal deficit present.     Mental Status: She is alert and oriented to person, place, and time. Mental status is at baseline.  Psychiatric:        Mood and Affect: Mood normal.        Behavior: Behavior normal.        Thought Content: Thought content normal.        Judgment: Judgment normal.     Results for orders placed or performed in visit on 04/28/20  CBC with Differential/Platelet  Result Value Ref Range   WBC 6.8 3.4 - 10.8 x10E3/uL   RBC 4.65 3.77 - 5.28 x10E6/uL   Hemoglobin 13.2 11.1 - 15.9 g/dL   Hematocrit 40.4 34.0 - 46.6 %   MCV 87 79 - 97 fL   MCH 28.4 26.6 - 33.0 pg   MCHC 32.7 31.5 - 35.7 g/dL   RDW 13.9 11.7 - 15.4 %   Platelets 306 150 - 450 x10E3/uL   Neutrophils 58 Not Estab. %   Lymphs 35 Not Estab. %   Monocytes 6 Not Estab. %   Eos 1 Not Estab. %   Basos 0 Not Estab. %   Neutrophils Absolute 3.9 1.4 - 7.0 x10E3/uL   Lymphocytes Absolute 2.4 0.7 - 3.1 x10E3/uL   Monocytes Absolute 0.4 0.1 - 0.9 x10E3/uL   EOS (ABSOLUTE) 0.1 0.0 - 0.4 x10E3/uL   Basophils Absolute 0.0 0.0 - 0.2 x10E3/uL   Immature Granulocytes 0 Not Estab. %   Immature Grans (Abs) 0.0 0.0 - 0.1 x10E3/uL  Comprehensive metabolic panel  Result Value Ref Range   Glucose 97 65 - 99 mg/dL   BUN 16 6 - 24 mg/dL   Creatinine, Ser 0.90 0.57 - 1.00 mg/dL   GFR calc non Af Amer 76 >59 mL/min/1.73   GFR calc Af Amer 87 >59 mL/min/1.73   BUN/Creatinine Ratio 18 9 - 23   Sodium 142 134 - 144 mmol/L   Potassium 4.3 3.5 - 5.2 mmol/L   Chloride 104 96 - 106 mmol/L   CO2 24 20 - 29 mmol/L   Calcium 10.0 8.7 - 10.2 mg/dL   Total Protein 6.8 6.0 - 8.5 g/dL   Albumin 4.9 (H) 3.8 - 4.8 g/dL   Globulin, Total 1.9 1.5 - 4.5 g/dL   Albumin/Globulin Ratio 2.6 (H) 1.2 -  2.2   Bilirubin Total 0.4 0.0 -  1.2 mg/dL   Alkaline Phosphatase 53 44 - 121 IU/L   AST 12 0 - 40 IU/L   ALT 12 0 - 32 IU/L      Assessment & Plan:   Problem List Items Addressed This Visit      Cardiovascular and Mediastinum   Migraine without aura and without status migrainosus, not intractable    In exacerbation with migraine for the past couple of days. Will treat with toradol and start nortriptyline. Imitrex given for bad headache. Continue to monitor closely if not 100% better within a week, we will obtain MRI given changes in her headache. Continue to monitor. Call with any concerns.       Relevant Medications   nortriptyline (PAMELOR) 25 MG capsule   SUMAtriptan (IMITREX) 100 MG tablet   Other Relevant Orders   CBC with Differential/Platelet (Completed)   Comprehensive metabolic panel (Completed)    Other Visit Diagnoses    Postherpetic neuralgia    -  Primary   Will treat with nortriptyline. Rx for valacyclovir given if rash seems like it's getting worse. Continue to monitor. Recheck couple weeks. Call with any concern   Relevant Orders   Comprehensive metabolic panel (Completed)       Follow up plan: Return couple of weeks.

## 2020-04-29 LAB — CBC WITH DIFFERENTIAL/PLATELET
Basophils Absolute: 0 10*3/uL (ref 0.0–0.2)
Basos: 0 %
EOS (ABSOLUTE): 0.1 10*3/uL (ref 0.0–0.4)
Eos: 1 %
Hematocrit: 40.4 % (ref 34.0–46.6)
Hemoglobin: 13.2 g/dL (ref 11.1–15.9)
Immature Grans (Abs): 0 10*3/uL (ref 0.0–0.1)
Immature Granulocytes: 0 %
Lymphocytes Absolute: 2.4 10*3/uL (ref 0.7–3.1)
Lymphs: 35 %
MCH: 28.4 pg (ref 26.6–33.0)
MCHC: 32.7 g/dL (ref 31.5–35.7)
MCV: 87 fL (ref 79–97)
Monocytes Absolute: 0.4 10*3/uL (ref 0.1–0.9)
Monocytes: 6 %
Neutrophils Absolute: 3.9 10*3/uL (ref 1.4–7.0)
Neutrophils: 58 %
Platelets: 306 10*3/uL (ref 150–450)
RBC: 4.65 x10E6/uL (ref 3.77–5.28)
RDW: 13.9 % (ref 11.7–15.4)
WBC: 6.8 10*3/uL (ref 3.4–10.8)

## 2020-04-29 LAB — COMPREHENSIVE METABOLIC PANEL
ALT: 12 IU/L (ref 0–32)
AST: 12 IU/L (ref 0–40)
Albumin/Globulin Ratio: 2.6 — ABNORMAL HIGH (ref 1.2–2.2)
Albumin: 4.9 g/dL — ABNORMAL HIGH (ref 3.8–4.8)
Alkaline Phosphatase: 53 IU/L (ref 44–121)
BUN/Creatinine Ratio: 18 (ref 9–23)
BUN: 16 mg/dL (ref 6–24)
Bilirubin Total: 0.4 mg/dL (ref 0.0–1.2)
CO2: 24 mmol/L (ref 20–29)
Calcium: 10 mg/dL (ref 8.7–10.2)
Chloride: 104 mmol/L (ref 96–106)
Creatinine, Ser: 0.9 mg/dL (ref 0.57–1.00)
GFR calc Af Amer: 87 mL/min/{1.73_m2} (ref >59)
GFR calc non Af Amer: 76 mL/min/{1.73_m2} (ref >59)
Globulin, Total: 1.9 g/dL (ref 1.5–4.5)
Glucose: 97 mg/dL (ref 65–99)
Potassium: 4.3 mmol/L (ref 3.5–5.2)
Sodium: 142 mmol/L (ref 134–144)
Total Protein: 6.8 g/dL (ref 6.0–8.5)

## 2020-05-03 ENCOUNTER — Encounter: Payer: Self-pay | Admitting: Family Medicine

## 2020-05-03 DIAGNOSIS — G43009 Migraine without aura, not intractable, without status migrainosus: Secondary | ICD-10-CM

## 2020-05-03 NOTE — Assessment & Plan Note (Signed)
In exacerbation with migraine for the past couple of days. Will treat with toradol and start nortriptyline. Imitrex given for bad headache. Continue to monitor closely if not 100% better within a week, we will obtain MRI given changes in her headache. Continue to monitor. Call with any concerns.

## 2020-05-13 ENCOUNTER — Ambulatory Visit
Admission: RE | Admit: 2020-05-13 | Discharge: 2020-05-13 | Disposition: A | Discharge status: Home / Self Care | Payer: 59 | Source: Ambulatory Visit | Attending: Family Medicine | Admitting: Family Medicine

## 2020-05-13 ENCOUNTER — Other Ambulatory Visit: Payer: Self-pay

## 2020-05-13 DIAGNOSIS — Z1231 Encounter for screening mammogram for malignant neoplasm of breast: Secondary | ICD-10-CM | POA: Insufficient documentation

## 2020-05-27 ENCOUNTER — Telehealth: Payer: Self-pay

## 2020-05-27 ENCOUNTER — Ambulatory Visit
Admission: EM | Admit: 2020-05-27 | Discharge: 2020-05-27 | Disposition: A | Discharge status: Home / Self Care | Payer: 59 | Attending: Sports Medicine | Admitting: Sports Medicine

## 2020-05-27 ENCOUNTER — Encounter: Payer: Self-pay | Admitting: Emergency Medicine

## 2020-05-27 ENCOUNTER — Other Ambulatory Visit: Payer: Self-pay

## 2020-05-27 DIAGNOSIS — R519 Headache, unspecified: Secondary | ICD-10-CM

## 2020-05-27 DIAGNOSIS — H9202 Otalgia, left ear: Secondary | ICD-10-CM | POA: Diagnosis present

## 2020-05-27 DIAGNOSIS — Z20822 Contact with and (suspected) exposure to covid-19: Secondary | ICD-10-CM | POA: Diagnosis not present

## 2020-05-27 DIAGNOSIS — G43009 Migraine without aura, not intractable, without status migrainosus: Secondary | ICD-10-CM

## 2020-05-27 LAB — SARS CORONAVIRUS 2 (TAT 6-24 HRS): SARS Coronavirus 2: NEGATIVE

## 2020-05-27 NOTE — Telephone Encounter (Signed)
Copied from [place] [phone]. Topic: General - Other >> May 27, 2020  9:31 AM Keene Breath wrote: Reason for CRM: Dr. Drema Dallas with [facility] called to speak with Dr. Wynetta Emery regarding a mutual patient.  Please call back asap at [phone]

## 2020-05-27 NOTE — ED Provider Notes (Signed)
[facility]    CSN: [id number] Arrival date & time: 05/27/20  3329      History   Chief Complaint Chief Complaint  Patient presents with  . Sinus Problem  . Otalgia  . Headache    HPI Judy Munoz is a 50 y.o. female.   Patient pleasant 50 year old female who presents for evaluation of worsening left-sided headache facial pain and ear pain.  She does suffer from migraines.  She has been followed chronically since September for this.  Her primary care physician is Dr. Park Liter at [facility] family [facility] in [place].  She reports that her symptoms worsen to the point where she almost went to the emergency room last night.  She did take half a hydrocodone that she had leftover from shingles that she contracted around Christmas time.  That eased the pain and caused her not to go to the ER.  The shingles was located over the abdominal area.  That has resolved.  She reports that she may have had some chills but she wonders whether or not it was secondary to pain.  No fevers.  No nausea vomiting or diarrhea.  She denies any vision changes.  No chest pain or shortness of breath or Covid symptoms.  She has been vaccinated x2 and she has received a flu shot.  When she was seen in September 2021 she was given antibiotics steroids and Flonase.  She says the Flonase helps her symptoms somewhat.  Reports a little fullness in her left ear as well.  She denies any painful vision blurry vision or double vision.  Of note, she does have a diagnosis of migraine headaches in her problem list.  She says this is different than her typical migraines.  She did call her primary care physician's office but they said they were unable to see her because her husband tested positive on January 15 for Covid.  Despite this, he has completed his quarantine per CDC guidelines and he is back to work and asymptomatic.     Past Medical History:  Diagnosis Date  . Allergy Seasonal  . Anemia    H/O WITH  PREGNANCY  . Asthma   . Biliary dyskinesia   . Elevated glucose   . GERD (gastroesophageal reflux disease)   . Headache    MIGRAINES RARE  . Heart murmur    WAS TOLD THIS ONCE YEARS AGO-HAS NEVER BEEN TOLD SINCE  . High cholesterol   . History of uterine fibroid   . IFG (impaired fasting glucose)   . PONV (postoperative nausea and vomiting)    HAPPENED WITH HER 1ST EGD- HER 2ND EGD SHE WAS GIVEN ANTI-EMETIC IV AND HAD NO N/V  . Trapezius muscle strain   . Vitamin D deficiency     Patient Active Problem List   Diagnosis Date Noted  . Migraine without aura and without status migrainosus, not intractable 04/28/2020  . Reactive airway disease 01/13/2019  . Scarring, keloid 06/20/2017  . Anemia   . Heart murmur   . Gastritis   . Overweight (BMI 25.0-29.9) 03/23/2015  . GERD (gastroesophageal reflux disease)   . High cholesterol   . Vitamin D deficiency   . IFG (impaired fasting glucose)     Past Surgical History:  Procedure Laterality Date  . CHOLECYSTECTOMY N/A 07/18/2016   Procedure: LAPAROSCOPIC CHOLECYSTECTOMY;  Surgeon: Jules Husbands, MD;  Location: [facility];  Service: General;  Laterality: N/A;  . CHOLECYSTECTOMY, LAPAROSCOPIC  07/18/2016  . ESOPHAGOGASTRODUODENOSCOPY    .  ESOPHAGOGASTRODUODENOSCOPY (EGD) WITH PROPOFOL N/A 07/26/2015   Procedure: ESOPHAGOGASTRODUODENOSCOPY (EGD) WITH PROPOFOL;  Surgeon: Lucilla Lame, MD;  Location: [facility];  Service: Endoscopy;  Laterality: N/A;    OB History    Gravida  1   Para  1   Term  1   Preterm      AB      Living  1     SAB      IAB      Ectopic      Multiple      Live Births  1            Home Medications    Prior to Admission medications   Medication Sig Start Date End Date Taking? Authorizing Provider  cetirizine (ZYRTEC) 10 MG tablet Take 10 mg by mouth daily.   Yes [provider]  Cholecalciferol (VITAMIN D-400 PO) Take 800 mg by mouth.    Yes [provider]   fluticasone (FLONASE) 50 MCG/ACT nasal spray Place into the nose. 04/02/17  Yes [provider]  levonorgestrel (MIRENA) 20 MCG/24HR IUD by Intrauterine route once.  05/08/19  Yes [provider]  pantoprazole (PROTONIX) 40 MG tablet Take 1 tablet (40 mg total) by mouth 2 (two) times daily. 01/15/20  Yes Johnson, Megan P, DO  pravastatin (PRAVACHOL) 20 MG tablet TAKE 1 TABLET BY MOUTH EVERYDAY AT BEDTIME 01/15/20  Yes Johnson, Megan P, DO  albuterol (VENTOLIN HFA) 108 (90 Base) MCG/ACT inhaler TAKE 2 PUFFS BY MOUTH EVERY 6 HOURS AS NEEDED FOR WHEEZE OR SHORTNESS OF BREATH 01/15/20   Johnson, Megan P, DO  nortriptyline (PAMELOR) 25 MG capsule Take 1 capsule (25 mg total) by mouth at bedtime. 04/28/20   Johnson, Megan P, DO  SUMAtriptan (IMITREX) 100 MG tablet Take 1 tablet (100 mg total) by mouth every 2 (two) hours as needed for migraine. May repeat in 2 hours if headache persists or recurs. 04/28/20   Johnson, Megan P, DO  valACYclovir (VALTREX) 1000 MG tablet Take 1 tablet (1,000 mg total) by mouth 2 (two) times daily. 04/28/20   Valerie Roys, DO    Family History Family History  Problem Relation Age of Onset  . Diabetes Father   . Heart disease Father   . Hypertension Father   . Hyperlipidemia Father   . Thyroid disease Mother   . Cancer Maternal Grandmother        uterine  . Breast cancer Maternal Grandmother 17  . Thyroid disease Maternal Grandmother   . Lung cancer Maternal Uncle     Social History Social History   Tobacco Use  . Smoking status: Never Smoker  . Smokeless tobacco: Never Used  Vaping Use  . Vaping Use: Never used  Substance Use Topics  . Alcohol use: No  . Drug use: No     Allergies   Sulfa antibiotics   Review of Systems Review of Systems  Constitutional: Negative for activity change, appetite change, chills, diaphoresis, fatigue and fever.  HENT: Negative.   Eyes: Negative for photophobia, pain, discharge, redness and itching.   Respiratory: Negative.   Cardiovascular: Negative.   Gastrointestinal: Negative.   Endocrine: Negative.   Genitourinary: Negative.   Musculoskeletal: Negative.   Skin: Negative for color change, pallor, rash and wound.  Neurological: Positive for headaches. Negative for dizziness, seizures, syncope, facial asymmetry, speech difficulty, weakness, light-headedness and numbness.  All other systems reviewed and are negative.    Physical Exam Triage Vital Signs ED Triage  Vitals  Enc Vitals Group     BP 05/27/20 0841 129/80     Pulse Rate 05/27/20 0841 73     Resp 05/27/20 0841 14     Temp 05/27/20 0841 98 F (36.7 C)     Temp Source 05/27/20 0841 Oral     SpO2 05/27/20 0841 100 %     Weight 05/27/20 0838 156 lb (70.8 kg)     Height 05/27/20 0838 5' 3"  (1.6 m)     Head Circumference --      Peak Flow --      Pain Score 05/27/20 0838 4     Pain Loc --      Pain Edu? --      Excl. in [place]? --    No data found.  Updated Vital Signs BP 129/80 (BP Location: Right Arm)   Pulse 73   Temp 98 F (36.7 C) (Oral)   Resp 14   Ht 5' 3"  (1.6 m)   Wt 70.8 kg   SpO2 100%   BMI 27.63 kg/m   Visual Acuity Right Eye Distance:   Left Eye Distance:   Bilateral Distance:    Right Eye Near:   Left Eye Near:    Bilateral Near:     Physical Exam Vitals and nursing note reviewed.  Constitutional:      General: She is not in acute distress.    Appearance: She is well-developed. She is not ill-appearing, toxic-appearing or diaphoretic.  HENT:     Head: Normocephalic and atraumatic.     Right Ear: Hearing and tympanic membrane normal.     Left Ear: Hearing normal. No foreign body. Tympanic membrane is not injected, scarred, perforated, erythematous or retracted.     Mouth/Throat:     Mouth: Mucous membranes are moist.     Pharynx: Oropharynx is clear.  Eyes:     General: Vision grossly intact. No allergic shiner, visual field deficit or scleral icterus.       Left eye: No foreign  body, discharge or hordeolum.     Extraocular Movements: Extraocular movements intact.     Right eye: Normal extraocular motion and no nystagmus.     Left eye: Normal extraocular motion and no nystagmus.     Conjunctiva/sclera: Conjunctivae normal.     Left eye: Left conjunctiva is not injected. No chemosis, exudate or hemorrhage.    Pupils: Pupils are equal, round, and reactive to light.     Funduscopic exam:    Right eye: Red reflex present.        Left eye: No hemorrhage, exudate, AV nicking, arteriolar narrowing or papilledema. Red reflex and venous pulsations present.    Visual Fields: Right eye visual fields normal and left eye visual fields normal.  Cardiovascular:     Rate and Rhythm: Normal rate and regular rhythm.     Heart sounds: Normal heart sounds. No murmur heard. No friction rub. No gallop.   Pulmonary:     Effort: Pulmonary effort is normal. No respiratory distress.     Breath sounds: Normal breath sounds. No stridor. No wheezing, rhonchi or rales.  Skin:    General: Skin is warm and dry.     Capillary Refill: Capillary refill takes less than 2 seconds.  Neurological:     Mental Status: She is alert.     GCS: GCS eye subscore is 4. GCS verbal subscore is 5. GCS motor subscore is 6.     Cranial Nerves: No cranial nerve  deficit.     Sensory: No sensory deficit.  Psychiatric:        Mood and Affect: Mood is anxious.        Behavior: Behavior normal.      UC Treatments / Results  Labs (all labs ordered are listed, but only abnormal results are displayed) Labs Reviewed  SARS CORONAVIRUS 2 (TAT 6-24 HRS)    EKG   Radiology No results found.  Procedures Procedures (including critical care time)  Medications Ordered in UC Medications - No data to display  Initial Impression / Assessment and Plan / UC Course  I have reviewed the triage vital signs and the nursing notes.  Pertinent labs & imaging results that were available during my care of the patient  were reviewed by me and considered in my medical decision making (see chart for details).   Clinical impression:  Acute onset of left-sided headache facial pain and left ear pain with a background history of a chronic issue with similar symptoms.  Complicating her situation is she had shingles a Christmas but it was confined to the abdomen.  Her exam is grossly nonfocal from a neurological standpoint and she has no vision involvement with a normal funduscopy examination.  Treatment plan: 1.  The findings and treatment plan were discussed in detail with the patient.  Patient was in agreement. 2.  I discussed her case with her primary care physician Dr. Wynetta Emery on the phone.  She agreed with no treatment at this time other than supportive care.  She indicated she would reach out to the patient later this afternoon to set up advanced imaging that her last clinic note indicated would be the next step.  She also indicated if the patient needed to be seen today to just message her and she would work her in either today or tomorrow. 3.  Just continue with supportive care saline flushes and her Flonase.  She can use intermittent hydrocodone that she has left over from her shingles if her headache is really bad. 4.  If the headache does present to the point that she cannot wait, I have instructed her to go straight to the ER or call 911 so that advanced imaging can be done in an emergency room setting.  She voiced verbal understanding. 5.  She does not need a work note. 6.  Follow-up here as needed.    Final Clinical Impressions(s) / UC Diagnoses   Final diagnoses:  Migraine without aura and without status migrainosus, not intractable  Left ear pain  Left facial pressure and pain     Discharge Instructions     I discussed her case with her primary care physician Dr. Wynetta Emery on the phone.  She agreed with no treatment at this time other than supportive care.  She indicated she would reach out to the  patient later this afternoon to set up advanced imaging that her last clinic note indicated would be the next step.  She also indicated if the patient needed to be seen today to just message her and she would work her in either today or tomorrow. Just continue with supportive care saline flushes and her Flonase.  She can use intermittent hydrocodone that she has left over from her shingles if her headache is really bad. If the headache does present to the point that she cannot wait, I have instructed her to go straight to the ER or call 911 so that advanced imaging can be done in an emergency room  setting.  She voiced verbal understanding. She does not need a work note. Follow-up here as needed.    ED Prescriptions    None     PDMP not reviewed this encounter.   Verda Cumins, MD 05/27/20 1048

## 2020-05-27 NOTE — Discharge Instructions (Signed)
I discussed her case with her primary care physician Dr. Wynetta Emery on the phone.  She agreed with no treatment at this time other than supportive care.  She indicated she would reach out to the patient later this afternoon to set up advanced imaging that her last clinic note indicated would be the next step.  She also indicated if the patient needed to be seen today to just message her and she would work her in either today or tomorrow. Just continue with supportive care saline flushes and her Flonase.  She can use intermittent hydrocodone that she has left over from her shingles if her headache is really bad. If the headache does present to the point that she cannot wait, I have instructed her to go straight to the ER or call 911 so that advanced imaging can be done in an emergency room setting.  She voiced verbal understanding. She does not need a work note. Follow-up here as needed.

## 2020-05-27 NOTE — ED Triage Notes (Signed)
Patient c/o sinus pain and pressure, headache, and left ear pain that started yesterday.  Patient has tried OTC medicine with no relief.  Patient denies fevers.

## 2020-05-28 ENCOUNTER — Encounter: Payer: Self-pay | Admitting: Family Medicine

## 2020-05-28 ENCOUNTER — Telehealth: Payer: Self-pay

## 2020-05-28 NOTE — Telephone Encounter (Signed)
Patient notified

## 2020-05-28 NOTE — Telephone Encounter (Signed)
Copied from [place] [phone]. Topic: General - Other >> May 27, 2020  9:31 AM Keene Breath wrote: Reason for CRM: Dr. Drema Dallas with [facility] called to speak with Dr. Wynetta Emery regarding a mutual patient.  Please call back asap at [phone] >> May 28, 2020  2:15 PM Greggory Keen D wrote: Pt called wanting to know if Dr.Malaia Buchta has seen the message from the Urgent care.  Pt states Urgent care said she needed to have a CT of her head but Dr. Wynetta Emery will have to order it for her insurance.  Pt does not want to do a virtual appt. She went to the urgent care yesterday for a headache and ear pain.     She states she just wants to get a CT scan or MRI.   Please call patient back at [phone].

## 2020-05-28 NOTE — Telephone Encounter (Signed)
Please let her know that I spoke to the urgent care doctor yesterday. I will get her her imaging ordered- but I'm seeing patients right now so it will not be until the end of the day at the earliest and they will call her to get it scheduled- it will have to be approved by her insurance and it will not be today

## 2020-05-31 MED ORDER — ALPRAZOLAM 1 MG PO TABS: ORAL_TABLET | ORAL | 0 refills | Status: DC

## 2020-05-31 NOTE — Telephone Encounter (Signed)
Spoke with patient after scheduling MRI. She states that she is very claustrophobic. She would like to know if something could be called in for patient. I also let patient know that if something is prescribed she would have to have a ride home. Thank you

## 2020-06-02 ENCOUNTER — Ambulatory Visit: Payer: Self-pay | Admitting: Family Medicine

## 2020-06-04 ENCOUNTER — Other Ambulatory Visit: Payer: Self-pay

## 2020-06-04 ENCOUNTER — Ambulatory Visit
Admission: RE | Admit: 2020-06-04 | Discharge: 2020-06-04 | Disposition: A | Discharge status: Home / Self Care | Payer: 59 | Source: Ambulatory Visit | Attending: Family Medicine | Admitting: Family Medicine

## 2020-06-04 DIAGNOSIS — G43009 Migraine without aura, not intractable, without status migrainosus: Secondary | ICD-10-CM | POA: Diagnosis present

## 2020-06-08 ENCOUNTER — Other Ambulatory Visit: Payer: Self-pay

## 2020-06-08 ENCOUNTER — Encounter: Payer: Self-pay | Admitting: Family Medicine

## 2020-06-08 ENCOUNTER — Ambulatory Visit (INDEPENDENT_AMBULATORY_CARE_PROVIDER_SITE_OTHER): Payer: 59 | Admitting: Family Medicine

## 2020-06-08 VITALS — BP 128/81 | HR 66 | Temp 98.2°F | Wt 158.6 lb

## 2020-06-08 DIAGNOSIS — G43009 Migraine without aura, not intractable, without status migrainosus: Secondary | ICD-10-CM

## 2020-06-08 MED ORDER — TOPIRAMATE 25 MG PO TABS: ORAL_TABLET | ORAL | 1 refills | Status: DC

## 2020-06-08 NOTE — Progress Notes (Signed)
BP 128/81   Pulse 66   Temp 98.2 F (36.8 C)   Wt 158 lb 9.6 oz (71.9 kg)   SpO2 100%   BMI 28.09 kg/m    Subjective:    Patient ID: Judy Munoz, female    DOB: 1970-09-30, 50 y.o.   MRN: [id number]  HPI: Judy Munoz is a 50 y.o. female  Chief Complaint  Patient presents with  . Headache    Patient had recent MRI done for headaches. Follow up   . Ear Pain    Patient states she was seen in urgent care for ear pain, states it feels like there is pressure. Was not given any medications for pain due to having an up coming MRI.    MIGRAINES Duration: a few months Onset: sudden Severity: severe Quality: sharp Frequency: a couple a week Location: L side behind her eye Headache duration: hours Radiation: into her ear and jaw Time of day headache occurs: at random Headache status at time of visit: asymptomatic Treatments attempted: Treatments attempted: nortriptyline, imitrex, OTC meds    Aura: no Nausea:  yes Vomiting: no Photophobia:  yes Phonophobia:  no Effect on social functioning:  yes Confusion:  no Gait disturbance/ataxia:  no Behavioral changes:  no Fevers:  no  Relevant past medical, surgical, family and social history reviewed and updated as indicated. Interim medical history since our last visit reviewed. Allergies and medications reviewed and updated.  Review of Systems  Constitutional: Negative.   HENT: Positive for ear pain. Negative for congestion, dental problem, drooling, ear discharge, facial swelling, hearing loss, mouth sores, nosebleeds, postnasal drip, rhinorrhea, sinus pressure, sinus pain, sneezing, sore throat, tinnitus, trouble swallowing and voice change.   Eyes: Positive for photophobia. Negative for pain, discharge, redness, itching and visual disturbance.  Respiratory: Negative.   Cardiovascular: Negative.   Neurological: Positive for headaches. Negative for dizziness, tremors, seizures, syncope, facial asymmetry, speech  difficulty, weakness, light-headedness and numbness.  Psychiatric/Behavioral: Negative.     Per HPI unless specifically indicated above     Objective:    BP 128/81   Pulse 66   Temp 98.2 F (36.8 C)   Wt 158 lb 9.6 oz (71.9 kg)   SpO2 100%   BMI 28.09 kg/m   Wt Readings from Last 3 Encounters:  06/08/20 158 lb 9.6 oz (71.9 kg)  05/27/20 156 lb (70.8 kg)  04/28/20 158 lb 3.2 oz (71.8 kg)    Physical Exam Vitals and nursing note reviewed.  Constitutional:      General: She is not in acute distress.    Appearance: Normal appearance. She is not ill-appearing, toxic-appearing or diaphoretic.  HENT:     Head: Normocephalic and atraumatic.     Right Ear: External ear normal.     Left Ear: External ear normal.     Nose: Nose normal.     Mouth/Throat:     Mouth: Mucous membranes are moist.     Pharynx: Oropharynx is clear.  Eyes:     General: No scleral icterus.       Right eye: No discharge.        Left eye: No discharge.     Extraocular Movements: Extraocular movements intact.     Conjunctiva/sclera: Conjunctivae normal.     Pupils: Pupils are equal, round, and reactive to light.  Cardiovascular:     Rate and Rhythm: Normal rate and regular rhythm.     Pulses: Normal pulses.     Heart sounds: Normal  heart sounds. No murmur heard. No friction rub. No gallop.   Pulmonary:     Effort: Pulmonary effort is normal. No respiratory distress.     Breath sounds: Normal breath sounds. No stridor. No wheezing, rhonchi or rales.  Chest:     Chest wall: No tenderness.  Musculoskeletal:        General: Normal range of motion.     Cervical back: Normal range of motion and neck supple.  Skin:    General: Skin is warm and dry.     Capillary Refill: Capillary refill takes less than 2 seconds.     Coloration: Skin is not jaundiced or pale.     Findings: No bruising, erythema, lesion or rash.  Neurological:     General: No focal deficit present.     Mental Status: She is alert and  oriented to person, place, and time. Mental status is at baseline.  Psychiatric:        Mood and Affect: Mood normal.        Behavior: Behavior normal.        Thought Content: Thought content normal.        Judgment: Judgment normal.     Results for orders placed or performed during the hospital encounter of 05/27/20  SARS CORONAVIRUS 2 (TAT 6-24 HRS) Nasopharyngeal Nasopharyngeal Swab   Specimen: Nasopharyngeal Swab  Result Value Ref Range   SARS Coronavirus 2 NEGATIVE NEGATIVE      Assessment & Plan:   Problem List Items Addressed This Visit      Cardiovascular and Mediastinum   Migraine without aura and without status migrainosus, not intractable - Primary    Had to stop the nortriptyline due to it making her feel off balance. Will start topamax to try to get migraine under better control. MRI normal. Call with any concerns.       Relevant Medications   topiramate (TOPAMAX) 25 MG tablet       Follow up plan: Return in about 4 weeks (around 07/06/2020).

## 2020-06-08 NOTE — Assessment & Plan Note (Signed)
Had to stop the nortriptyline due to it making her feel off balance. Will start topamax to try to get migraine under better control. MRI normal. Call with any concerns.

## 2020-06-11 ENCOUNTER — Encounter: Payer: Self-pay | Admitting: Family Medicine

## 2020-06-14 MED ORDER — IBUPROFEN 800 MG PO TABS: 800.0000 mg | ORAL_TABLET | Freq: Three times a day (TID) | ORAL | 3 refills | Status: DC | PRN

## 2020-06-20 ENCOUNTER — Encounter: Payer: Self-pay | Admitting: Family Medicine

## 2020-07-03 ENCOUNTER — Other Ambulatory Visit: Payer: Self-pay | Admitting: Family Medicine

## 2020-07-03 MED ORDER — TOPIRAMATE 25 MG PO TABS: 25.0000 mg | ORAL_TABLET | Freq: Two times a day (BID) | ORAL | 1 refills | Status: DC

## 2020-07-06 ENCOUNTER — Other Ambulatory Visit: Payer: Self-pay

## 2020-07-06 ENCOUNTER — Ambulatory Visit (INDEPENDENT_AMBULATORY_CARE_PROVIDER_SITE_OTHER): Payer: 59 | Admitting: Family Medicine

## 2020-07-06 ENCOUNTER — Encounter: Payer: Self-pay | Admitting: Family Medicine

## 2020-07-06 VITALS — BP 127/84 | HR 63 | Temp 97.6°F | Wt 156.4 lb

## 2020-07-06 DIAGNOSIS — Z23 Encounter for immunization: Secondary | ICD-10-CM

## 2020-07-06 DIAGNOSIS — R7301 Impaired fasting glucose: Secondary | ICD-10-CM

## 2020-07-06 DIAGNOSIS — E559 Vitamin D deficiency, unspecified: Secondary | ICD-10-CM

## 2020-07-06 DIAGNOSIS — E7800 Pure hypercholesterolemia, unspecified: Secondary | ICD-10-CM | POA: Diagnosis not present

## 2020-07-06 DIAGNOSIS — G43009 Migraine without aura, not intractable, without status migrainosus: Secondary | ICD-10-CM | POA: Diagnosis not present

## 2020-07-06 DIAGNOSIS — K219 Gastro-esophageal reflux disease without esophagitis: Secondary | ICD-10-CM | POA: Diagnosis not present

## 2020-07-06 LAB — BAYER DCA HB A1C WAIVED: HB A1C (BAYER DCA - WAIVED): 5.9 % (ref <7.0)

## 2020-07-06 MED ORDER — ALBUTEROL SULFATE HFA 108 (90 BASE) MCG/ACT IN AERS: INHALATION_SPRAY | RESPIRATORY_TRACT | 6 refills | Status: DC

## 2020-07-06 MED ORDER — PRAVASTATIN SODIUM 20 MG PO TABS: ORAL_TABLET | ORAL | 1 refills | Status: DC

## 2020-07-06 MED ORDER — TOPIRAMATE 25 MG PO TABS: 25.0000 mg | ORAL_TABLET | Freq: Two times a day (BID) | ORAL | 1 refills | Status: DC

## 2020-07-06 MED ORDER — PANTOPRAZOLE SODIUM 40 MG PO TBEC: 40.0000 mg | DELAYED_RELEASE_TABLET | Freq: Two times a day (BID) | ORAL | 1 refills | Status: DC

## 2020-07-06 NOTE — Progress Notes (Signed)
BP 127/84   Pulse 63   Temp 97.6 F (36.4 C)   Wt 156 lb 6 oz (70.9 kg)   BMI 27.70 kg/m    Subjective:    Patient ID: Judy Munoz, female    DOB: August 20, 1970, 50 y.o.   MRN: [id number]  HPI: Judy Munoz is a 50 y.o. female  Chief Complaint  Patient presents with  . Headache    Only side effects is tingling in her feet and night sweats   MIGRAINES- had some numbness and tingling in her feet. Starting to feeling a bit better Duration: a few months Onset: sudden Severity: severe Quality: sharp Frequency: 1 a week Location: L side behind her eye Headache duration: much shorter, a couple of hours Radiation: into her ear and jaw Time of day headache occurs: at random  Headache status at time of visit: asymptomatic Treatments attempted: Treatments attempted: rest, ice, heat, APAP, ibuprofen, aleve", excedrine, triptans, topamax and amitriptyline   Aura: no Nausea:  no Vomiting: no Photophobia:  yes Phonophobia:  no Effect on social functioning:  yes Confusion:  no Gait disturbance/ataxia:  no Behavioral changes:  no Fevers:  no  HYPERLIPIDEMIA Hyperlipidemia status: excellent compliance Satisfied with current treatment?  yes Side effects:  no Medication compliance: excellent compliance Past cholesterol meds: pravastatin Supplements: none Aspirin:  no The 10-year ASCVD risk score [name], et al., 2013) is: 0.7%   Values used to calculate the score:     Age: 47 years     Sex: Female     Is Non-Hispanic African American: No     Diabetic: No     Tobacco smoker: No     Systolic Blood Pressure: 354 mmHg     Is BP treated: No     HDL Cholesterol: 56 mg/dL     Total Cholesterol: 162 mg/dL Chest pain:  no  GERD GERD control status: controlled  Satisfied with current treatment? yes Heartburn frequency: rarely Medication side effects: no  Medication compliance: stable Dysphagia: no Odynophagia:  no Hematemesis: no Blood in stool: no EGD:  no  Impaired Fasting Glucose HbA1C:  Lab Results  Component Value Date   HGBA1C 5.7 01/15/2020   Duration of elevated blood sugar: chronic Polydipsia: no Polyuria: no Weight change: no Visual disturbance: no Glucose Monitoring: no Diabetic Education: Not Completed Family history of diabetes: yes   Relevant past medical, surgical, family and social history reviewed and updated as indicated. Interim medical history since our last visit reviewed. Allergies and medications reviewed and updated.  Review of Systems  Constitutional: Negative.   Respiratory: Negative.   Cardiovascular: Negative.   Gastrointestinal: Negative.   Musculoskeletal: Negative.   Neurological: Negative.   Psychiatric/Behavioral: Negative.     Per HPI unless specifically indicated above     Objective:    BP 127/84   Pulse 63   Temp 97.6 F (36.4 C)   Wt 156 lb 6 oz (70.9 kg)   BMI 27.70 kg/m   Wt Readings from Last 3 Encounters:  07/06/20 156 lb 6 oz (70.9 kg)  06/08/20 158 lb 9.6 oz (71.9 kg)  05/27/20 156 lb (70.8 kg)    Physical Exam Vitals and nursing note reviewed.  Constitutional:      General: She is not in acute distress.    Appearance: Normal appearance. She is not ill-appearing, toxic-appearing or diaphoretic.  HENT:     Head: Normocephalic and atraumatic.     Right Ear: External ear normal.  Left Ear: External ear normal.     Nose: Nose normal.     Mouth/Throat:     Mouth: Mucous membranes are moist.     Pharynx: Oropharynx is clear.  Eyes:     General: No scleral icterus.       Right eye: No discharge.        Left eye: No discharge.     Extraocular Movements: Extraocular movements intact.     Conjunctiva/sclera: Conjunctivae normal.     Pupils: Pupils are equal, round, and reactive to light.  Cardiovascular:     Rate and Rhythm: Normal rate and regular rhythm.     Pulses: Normal pulses.     Heart sounds: Normal heart sounds. No murmur heard. No friction rub. No  gallop.   Pulmonary:     Effort: Pulmonary effort is normal. No respiratory distress.     Breath sounds: Normal breath sounds. No stridor. No wheezing, rhonchi or rales.  Chest:     Chest wall: No tenderness.  Musculoskeletal:        General: Normal range of motion.     Cervical back: Normal range of motion and neck supple.  Skin:    General: Skin is warm and dry.     Capillary Refill: Capillary refill takes less than 2 seconds.     Coloration: Skin is not jaundiced or pale.     Findings: No bruising, erythema, lesion or rash.  Neurological:     General: No focal deficit present.     Mental Status: She is alert and oriented to person, place, and time. Mental status is at baseline.  Psychiatric:        Mood and Affect: Mood normal.        Behavior: Behavior normal.        Thought Content: Thought content normal.        Judgment: Judgment normal.     Results for orders placed or performed during the hospital encounter of 05/27/20  SARS CORONAVIRUS 2 (TAT 6-24 HRS) Nasopharyngeal Nasopharyngeal Swab   Specimen: Nasopharyngeal Swab  Result Value Ref Range   SARS Coronavirus 2 NEGATIVE NEGATIVE      Assessment & Plan:   Problem List Items Addressed This Visit      Cardiovascular and Mediastinum   Migraine without aura and without status migrainosus, not intractable - Primary    Doing much better. Continue current regimen. Continue to monitor. Call with any concerns.       Relevant Medications   topiramate (TOPAMAX) 25 MG tablet   pravastatin (PRAVACHOL) 20 MG tablet     Digestive   GERD (gastroesophageal reflux disease)    Under good control on current regimen. Continue current regimen. Continue to monitor. Call with any concerns. Refills given. Labs drawn today.        Relevant Medications   pantoprazole (PROTONIX) 40 MG tablet   Other Relevant Orders   Comprehensive metabolic panel   CBC with Differential/Platelet     Endocrine   IFG (impaired fasting glucose)     Rechecking labs today. Await results. Treat as needed.       Relevant Orders   Comprehensive metabolic panel   Bayer DCA Hb A1c Waived     Other   High cholesterol    Under good control on current regimen. Continue current regimen. Continue to monitor. Call with any concerns. Refills given. Labs drawn today.       Relevant Medications   pravastatin (PRAVACHOL) 20 MG tablet  Other Relevant Orders   Comprehensive metabolic panel   Lipid Panel w/o Chol/HDL Ratio   Vitamin D deficiency    Rechecking labs today. Await results. Treat as needed.       Relevant Orders   VITAMIN D 25 Hydroxy (Vit-D Deficiency, Fractures)    Other Visit Diagnoses    Immunization due       Tdap given today.   Relevant Orders   Tdap vaccine greater than or equal to 7yo IM (Completed)       Follow up plan: Return in about 6 months (around 01/06/2021) for physical.

## 2020-07-06 NOTE — Assessment & Plan Note (Signed)
Under good control on current regimen. Continue current regimen. Continue to monitor. Call with any concerns. Refills given. Labs drawn today.   

## 2020-07-06 NOTE — Assessment & Plan Note (Signed)
Doing much better. Continue current regimen. Continue to monitor. Call with any concerns.

## 2020-07-06 NOTE — Patient Instructions (Signed)
[name], black Judy Munoz

## 2020-07-06 NOTE — Assessment & Plan Note (Signed)
Rechecking labs today. Await results. Treat as needed.  °

## 2020-07-07 LAB — CBC WITH DIFFERENTIAL/PLATELET
Basophils Absolute: 0 10*3/uL (ref 0.0–0.2)
Basos: 1 %
EOS (ABSOLUTE): 0.1 10*3/uL (ref 0.0–0.4)
Eos: 1 %
Hematocrit: 38.1 % (ref 34.0–46.6)
Hemoglobin: 13 g/dL (ref 11.1–15.9)
Immature Grans (Abs): 0 10*3/uL (ref 0.0–0.1)
Immature Granulocytes: 0 %
Lymphocytes Absolute: 2.6 10*3/uL (ref 0.7–3.1)
Lymphs: 40 %
MCH: 29.3 pg (ref 26.6–33.0)
MCHC: 34.1 g/dL (ref 31.5–35.7)
MCV: 86 fL (ref 79–97)
Monocytes Absolute: 0.4 10*3/uL (ref 0.1–0.9)
Monocytes: 7 %
Neutrophils Absolute: 3.4 10*3/uL (ref 1.4–7.0)
Neutrophils: 51 %
Platelets: 295 10*3/uL (ref 150–450)
RBC: 4.43 x10E6/uL (ref 3.77–5.28)
RDW: 13.8 % (ref 11.7–15.4)
WBC: 6.6 10*3/uL (ref 3.4–10.8)

## 2020-07-07 LAB — COMPREHENSIVE METABOLIC PANEL
ALT: 15 IU/L (ref 0–32)
AST: 15 IU/L (ref 0–40)
Albumin/Globulin Ratio: 2.1 (ref 1.2–2.2)
Albumin: 4.8 g/dL (ref 3.8–4.8)
Alkaline Phosphatase: 62 IU/L (ref 44–121)
BUN/Creatinine Ratio: 14 (ref 9–23)
BUN: 13 mg/dL (ref 6–24)
Bilirubin Total: 0.3 mg/dL (ref 0.0–1.2)
CO2: 19 mmol/L — ABNORMAL LOW (ref 20–29)
Calcium: 9.6 mg/dL (ref 8.7–10.2)
Chloride: 109 mmol/L — ABNORMAL HIGH (ref 96–106)
Creatinine, Ser: 0.91 mg/dL (ref 0.57–1.00)
Globulin, Total: 2.3 g/dL (ref 1.5–4.5)
Glucose: 98 mg/dL (ref 65–99)
Potassium: 4.6 mmol/L (ref 3.5–5.2)
Sodium: 145 mmol/L — ABNORMAL HIGH (ref 134–144)
Total Protein: 7.1 g/dL (ref 6.0–8.5)
eGFR: 78 mL/min/{1.73_m2} (ref >59)

## 2020-07-07 LAB — LIPID PANEL W/O CHOL/HDL RATIO
Cholesterol, Total: 165 mg/dL (ref 100–199)
HDL: 59 mg/dL (ref >39)
LDL Chol Calc (NIH): 90 mg/dL (ref 0–99)
Triglycerides: 85 mg/dL (ref 0–149)
VLDL Cholesterol Cal: 16 mg/dL (ref 5–40)

## 2020-07-07 LAB — VITAMIN D 25 HYDROXY (VIT D DEFICIENCY, FRACTURES): Vit D, 25-Hydroxy: 43.3 ng/mL (ref 30.0–100.0)

## 2020-07-19 LAB — COLOGUARD: Cologuard: NEGATIVE

## 2020-08-19 ENCOUNTER — Telehealth: Payer: Self-pay

## 2020-08-19 NOTE — Telephone Encounter (Signed)
Called pt she is wanting to have 1 or 2 zofrans states that she has a migraine and forget hers at home she took 800 mg advil and is now nauseous. Please advise    Copied from [place] [phone]. Topic: General - Other >> Aug 19, 2020  1:53 PM Pawlus, Brayton Layman A wrote: Reason for CRM: Pt went out of town and is in [place], forget her medication, wanted to know if it would be possible to send in an small supply to [facility] in [place], [place].

## 2020-08-20 MED ORDER — PRAVASTATIN SODIUM 20 MG PO TABS: ORAL_TABLET | ORAL | 1 refills | Status: DC

## 2020-08-20 MED ORDER — PANTOPRAZOLE SODIUM 40 MG PO TBEC: 40.0000 mg | DELAYED_RELEASE_TABLET | Freq: Two times a day (BID) | ORAL | 1 refills | Status: DC

## 2020-08-20 MED ORDER — ONDANSETRON HCL 8 MG PO TABS
8.0000 mg | ORAL_TABLET | Freq: Three times a day (TID) | ORAL | 0 refills | Status: DC | PRN
Start: 2020-08-20 — End: 2023-02-07

## 2020-08-20 MED ORDER — IBUPROFEN 800 MG PO TABS: 800.0000 mg | ORAL_TABLET | Freq: Three times a day (TID) | ORAL | 3 refills | Status: DC | PRN

## 2020-08-20 MED ORDER — SUMATRIPTAN SUCCINATE 100 MG PO TABS: 100.0000 mg | ORAL_TABLET | ORAL | 0 refills | Status: DC | PRN

## 2020-08-20 MED ORDER — TOPIRAMATE 25 MG PO TABS: 25.0000 mg | ORAL_TABLET | Freq: Two times a day (BID) | ORAL | 1 refills | Status: DC

## 2020-11-15 ENCOUNTER — Encounter: Payer: Self-pay | Admitting: Family Medicine

## 2020-11-18 ENCOUNTER — Encounter: Payer: Self-pay | Admitting: Family Medicine

## 2020-11-18 ENCOUNTER — Telehealth (INDEPENDENT_AMBULATORY_CARE_PROVIDER_SITE_OTHER): Payer: 59 | Admitting: Family Medicine

## 2020-11-18 ENCOUNTER — Other Ambulatory Visit: Payer: Self-pay

## 2020-11-18 DIAGNOSIS — R300 Dysuria: Secondary | ICD-10-CM

## 2020-11-18 LAB — URINALYSIS, ROUTINE W REFLEX MICROSCOPIC
Bilirubin, UA: NEGATIVE
Glucose, UA: NEGATIVE
Ketones, UA: NEGATIVE
Leukocytes,UA: NEGATIVE
Nitrite, UA: NEGATIVE
Protein,UA: NEGATIVE
RBC, UA: NEGATIVE
Specific Gravity, UA: 1.02 (ref 1.005–1.030)
Urobilinogen, Ur: 0.2 mg/dL (ref 0.2–1.0)
pH, UA: 5 (ref 5.0–7.5)

## 2020-11-18 MED ORDER — CIPROFLOXACIN HCL 500 MG PO TABS: 500.0000 mg | ORAL_TABLET | Freq: Two times a day (BID) | ORAL | 0 refills | Status: DC

## 2020-11-18 NOTE — Progress Notes (Signed)
There were no vitals taken for this visit.   Subjective:    Patient ID: Judy Munoz, female    DOB: 1971/01/24, 50 y.o.   MRN: [id number]  HPI: Judy Munoz is a 50 y.o. female  Chief Complaint  Patient presents with   Urinary Tract Infection    Pt states she has been having pain in lower back and in R side. States she has also been having burning with urination and urinary frequency. States symptoms started on Monday.    URINARY SYMPTOMS Duration: 4 days Dysuria: yes Urinary frequency: yes Urgency: yes Small volume voids: yes Symptom severity:  moderate Urinary incontinence: no Foul odor: yes Hematuria: no Abdominal pain: yes Back pain: yes Suprapubic pain/pressure: yes Flank pain: yes Fever:  yes, no, subjective, and low grade Vomiting: no Relief with cranberry juice: no Relief with pyridium: no Status: worse Previous urinary tract infection: yes Recurrent urinary tract infection: no Sexual activity: monogomous History of sexually transmitted disease: no Vaginal discharge: no Treatments attempted: cranberry and increasing fluids    Relevant past medical, surgical, family and social history reviewed and updated as indicated. Interim medical history since our last visit reviewed. Allergies and medications reviewed and updated.  Review of Systems  Constitutional: Negative.   Respiratory: Negative.    Cardiovascular: Negative.   Gastrointestinal: Negative.   Genitourinary:  Positive for dysuria, frequency and urgency. Negative for decreased urine volume, difficulty urinating, dyspareunia, enuresis, flank pain, genital sores, hematuria, menstrual problem, pelvic pain, vaginal bleeding, vaginal discharge and vaginal pain.  Psychiatric/Behavioral: Negative.     Per HPI unless specifically indicated above     Objective:    There were no vitals taken for this visit.  Wt Readings from Last 3 Encounters:  07/06/20 156 lb 6 oz (70.9 kg)  06/08/20 158 lb  9.6 oz (71.9 kg)  05/27/20 156 lb (70.8 kg)    Physical Exam Vitals and nursing note reviewed.  Constitutional:      General: She is not in acute distress.    Appearance: Normal appearance. She is not ill-appearing, toxic-appearing or diaphoretic.  HENT:     Head: Normocephalic and atraumatic.     Right Ear: External ear normal.     Left Ear: External ear normal.     Nose: Nose normal.     Mouth/Throat:     Mouth: Mucous membranes are moist.     Pharynx: Oropharynx is clear.  Eyes:     General: No scleral icterus.       Right eye: No discharge.        Left eye: No discharge.     Conjunctiva/sclera: Conjunctivae normal.     Pupils: Pupils are equal, round, and reactive to light.  Pulmonary:     Effort: Pulmonary effort is normal. No respiratory distress.     Comments: Speaking in full sentences Musculoskeletal:        General: Normal range of motion.     Cervical back: Normal range of motion.  Skin:    Coloration: Skin is not jaundiced or pale.     Findings: No bruising, erythema, lesion or rash.  Neurological:     Mental Status: She is alert and oriented to person, place, and time. Mental status is at baseline.  Psychiatric:        Mood and Affect: Mood normal.        Behavior: Behavior normal.        Thought Content: Thought content normal.  Judgment: Judgment normal.    Results for orders placed or performed in visit on 07/20/20  Cologuard  Result Value Ref Range   Cologuard Negative Negative      Assessment & Plan:   Problem List Items Addressed This Visit   None Visit Diagnoses     Dysuria    -  Primary   Will treat even though UA clear- if not better tomorrow, consider imaging to r/o stone    Relevant Orders   Urinalysis, Routine w reflex microscopic   Urine Culture        Follow up plan: Return if symptoms worsen or fail to improve.   This visit was completed via video visit through MyChart due to the restrictions of the COVID-19 pandemic.  All issues as above were discussed and addressed. Physical exam was done as above through visual confirmation on video through MyChart. If it was felt that the patient should be evaluated in the office, they were directed there. The patient verbally consented to this visit. Location of the patient: home Location of the provider: work Those involved with this call:  Provider: Park Liter, DO CMA: Yvonna Alanis, Joshua Desk/Registration: Jill Side  Time spent on call:  15 minutes with patient face to face via video conference. More than 50% of this time was spent in counseling and coordination of care. 30 minutes total spent in review of patient's record and preparation of their chart.

## 2020-11-18 NOTE — Telephone Encounter (Signed)
Appt please, virtual with urine drop off ok- ok to double book me

## 2020-11-20 LAB — URINE CULTURE

## 2020-11-22 ENCOUNTER — Encounter: Payer: Self-pay | Admitting: Family Medicine

## 2020-11-22 DIAGNOSIS — R1032 Left lower quadrant pain: Secondary | ICD-10-CM

## 2020-11-23 ENCOUNTER — Other Ambulatory Visit: Payer: Self-pay | Admitting: Family Medicine

## 2020-11-23 ENCOUNTER — Other Ambulatory Visit: Payer: Self-pay

## 2020-11-23 ENCOUNTER — Ambulatory Visit
Admission: RE | Admit: 2020-11-23 | Discharge: 2020-11-23 | Disposition: A | Discharge status: Home / Self Care | Payer: 59 | Source: Ambulatory Visit | Attending: Family Medicine | Admitting: Family Medicine

## 2020-11-23 DIAGNOSIS — R1032 Left lower quadrant pain: Secondary | ICD-10-CM | POA: Insufficient documentation

## 2020-11-23 MED ORDER — HYDROCODONE-ACETAMINOPHEN 10-325 MG PO TABS: 1.0000 | ORAL_TABLET | Freq: Three times a day (TID) | ORAL | 0 refills | Status: AC | PRN

## 2020-11-23 MED ORDER — NORTRIPTYLINE HCL 10 MG PO CAPS: 10.0000 mg | ORAL_CAPSULE | Freq: Every day | ORAL | 1 refills | Status: DC

## 2020-12-08 ENCOUNTER — Other Ambulatory Visit: Payer: Self-pay | Admitting: Family Medicine

## 2020-12-08 NOTE — Telephone Encounter (Signed)
Requested medications are due for refill today.  yes  Requested medications are on the active medications list.  yes  Last refill. 08/20/2020  Future visit scheduled.   yes  Notes to clinic.  Medication not delegated.

## 2020-12-16 ENCOUNTER — Other Ambulatory Visit: Payer: Self-pay

## 2020-12-16 ENCOUNTER — Ambulatory Visit: Payer: 59 | Admitting: Family Medicine

## 2020-12-16 ENCOUNTER — Encounter: Payer: Self-pay | Admitting: Family Medicine

## 2020-12-16 VITALS — BP 124/87 | HR 82 | Temp 97.8°F | Ht 62.28 in | Wt 150.2 lb

## 2020-12-16 DIAGNOSIS — E559 Vitamin D deficiency, unspecified: Secondary | ICD-10-CM | POA: Diagnosis not present

## 2020-12-16 DIAGNOSIS — R7301 Impaired fasting glucose: Secondary | ICD-10-CM | POA: Diagnosis not present

## 2020-12-16 DIAGNOSIS — G43009 Migraine without aura, not intractable, without status migrainosus: Secondary | ICD-10-CM

## 2020-12-16 DIAGNOSIS — Z1231 Encounter for screening mammogram for malignant neoplasm of breast: Secondary | ICD-10-CM | POA: Diagnosis not present

## 2020-12-16 DIAGNOSIS — E7800 Pure hypercholesterolemia, unspecified: Secondary | ICD-10-CM

## 2020-12-16 DIAGNOSIS — Z0000 Encounter for general adult medical examination without abnormal findings: Secondary | ICD-10-CM | POA: Diagnosis not present

## 2020-12-16 DIAGNOSIS — B0229 Other postherpetic nervous system involvement: Secondary | ICD-10-CM | POA: Insufficient documentation

## 2020-12-16 LAB — URINALYSIS, ROUTINE W REFLEX MICROSCOPIC
Bilirubin, UA: NEGATIVE
Glucose, UA: NEGATIVE
Ketones, UA: NEGATIVE
Leukocytes,UA: NEGATIVE
Nitrite, UA: NEGATIVE
Protein,UA: NEGATIVE
RBC, UA: NEGATIVE
Specific Gravity, UA: 1.02 (ref 1.005–1.030)
Urobilinogen, Ur: 0.2 mg/dL (ref 0.2–1.0)
pH, UA: 7 (ref 5.0–7.5)

## 2020-12-16 LAB — BAYER DCA HB A1C WAIVED: HB A1C (BAYER DCA - WAIVED): 5.7 % (ref <7.0)

## 2020-12-16 MED ORDER — SUMATRIPTAN SUCCINATE 100 MG PO TABS: 100.0000 mg | ORAL_TABLET | ORAL | 12 refills | Status: DC | PRN

## 2020-12-16 MED ORDER — PRAVASTATIN SODIUM 20 MG PO TABS: ORAL_TABLET | ORAL | 1 refills | Status: DC

## 2020-12-16 MED ORDER — NORTRIPTYLINE HCL 10 MG PO CAPS: 10.0000 mg | ORAL_CAPSULE | Freq: Every day | ORAL | 1 refills | Status: DC

## 2020-12-16 MED ORDER — ALBUTEROL SULFATE HFA 108 (90 BASE) MCG/ACT IN AERS: INHALATION_SPRAY | RESPIRATORY_TRACT | 6 refills | Status: DC

## 2020-12-16 MED ORDER — TOPIRAMATE 25 MG PO TABS: 25.0000 mg | ORAL_TABLET | Freq: Two times a day (BID) | ORAL | 1 refills | Status: DC

## 2020-12-16 MED ORDER — PANTOPRAZOLE SODIUM 40 MG PO TBEC: 40.0000 mg | DELAYED_RELEASE_TABLET | Freq: Two times a day (BID) | ORAL | 1 refills | Status: DC

## 2020-12-16 NOTE — Assessment & Plan Note (Signed)
Labs drawn today. Await results. Treat as needed.

## 2020-12-16 NOTE — Assessment & Plan Note (Signed)
Under good control on current regimen. Continue current regimen. Continue to monitor. Call with any concerns. Refills given. Labs drawn today.   

## 2020-12-16 NOTE — Assessment & Plan Note (Signed)
Rechecking labs today. Await results. Treat as needed.  °

## 2020-12-16 NOTE — Assessment & Plan Note (Signed)
Under good control on current regimen. Continue current regimen. Continue to monitor. Call with any concerns. Refills given.   

## 2020-12-16 NOTE — Progress Notes (Signed)
BP 124/87   Pulse 82   Temp 97.8 F (36.6 C)   Ht 5' 2.28" (1.582 m)   Wt 150 lb 4 oz (68.2 kg)   SpO2 97%   BMI 27.23 kg/m    Subjective:    Patient ID: Judy Munoz, female    DOB: 12-16-1970, 50 y.o.   MRN: [id number]  HPI: Judy Munoz is a 50 y.o. female presenting on 12/16/2020 for comprehensive medical examination. Current medical complaints include:  Post-herpetic neuralgia- doing better with the nortriptyline. Feels like nerves are irritated and light touch with act her up.  Migraines are doing better. She feels stable. Tolerating medicine well.   HYPERLIPIDEMIA Hyperlipidemia status: excellent compliance Satisfied with current treatment?  yes Side effects:  no Medication compliance: excellent compliance Past cholesterol meds: pravastatin Supplements: none Aspirin:  no The 10-year ASCVD risk score [name], et al., 2013) is: 0.7%   Values used to calculate the score:     Age: 41 years     Sex: Female     Is Non-Hispanic African American: No     Diabetic: No     Tobacco smoker: No     Systolic Blood Pressure: 364 mmHg     Is BP treated: No     HDL Cholesterol: 59 mg/dL     Total Cholesterol: 165 mg/dL Chest pain:  no Coronary artery disease:  no  She currently lives with: husband and daughter Menopausal Symptoms: no  Depression Screen done today and results listed below:  Depression screen [facility] 2/9 07/14/2019 06/27/2018 06/27/2018 06/20/2017 06/14/2016  Decreased Interest 0 0 0 0 0  Down, Depressed, Hopeless 0 0 0 0 0  PHQ - 2 Score 0 0 0 0 0  Altered sleeping 0 0 0 0 -  Tired, decreased energy 0 0 0 1 -  Change in appetite 0 0 0 0 -  Feeling bad or failure about yourself  0 0 0 0 -  Trouble concentrating 0 0 0 0 -  Moving slowly or fidgety/restless 0 0 0 0 -  Suicidal thoughts 0 0 0 0 -  PHQ-9 Score 0 0 0 1 -  Difficult doing work/chores Not difficult at all Not difficult at all - Not difficult at all -    Past Medical History:  Past  Medical History:  Diagnosis Date   Allergy Seasonal   Anemia    H/O WITH PREGNANCY   Asthma    Biliary dyskinesia    Elevated glucose    GERD (gastroesophageal reflux disease)    Headache    MIGRAINES RARE   Heart murmur    WAS TOLD THIS ONCE YEARS AGO-HAS NEVER BEEN TOLD SINCE   High cholesterol    History of uterine fibroid    IFG (impaired fasting glucose)    PONV (postoperative nausea and vomiting)    HAPPENED WITH HER 1ST EGD- HER 2ND EGD SHE WAS GIVEN ANTI-EMETIC IV AND HAD NO N/V   Trapezius muscle strain    Vitamin D deficiency     Surgical History:  Past Surgical History:  Procedure Laterality Date   CHOLECYSTECTOMY N/A 07/18/2016   Procedure: LAPAROSCOPIC CHOLECYSTECTOMY;  Surgeon: Jules Husbands, MD;  Location: [facility];  Service: General;  Laterality: N/A;   CHOLECYSTECTOMY, LAPAROSCOPIC  07/18/2016   ESOPHAGOGASTRODUODENOSCOPY     ESOPHAGOGASTRODUODENOSCOPY (EGD) WITH PROPOFOL N/A 07/26/2015   Procedure: ESOPHAGOGASTRODUODENOSCOPY (EGD) WITH PROPOFOL;  Surgeon: Lucilla Lame, MD;  Location: [place];  Service: Endoscopy;  Laterality: N/A;    Medications:  Current Outpatient Medications on File Prior to Visit  Medication Sig   cetirizine (ZYRTEC) 10 MG tablet Take 10 mg by mouth daily.   Cholecalciferol (VITAMIN D-400 PO) Take 800 mg by mouth.    fluticasone (FLONASE) 50 MCG/ACT nasal spray Place into the nose.   ibuprofen (ADVIL) 800 MG tablet Take 1 tablet (800 mg total) by mouth every 8 (eight) hours as needed.   levonorgestrel (MIRENA) 20 MCG/24HR IUD by Intrauterine route once.    ondansetron (ZOFRAN) 8 MG tablet Take 1 tablet (8 mg total) by mouth every 8 (eight) hours as needed for nausea or vomiting.   No current facility-administered medications on file prior to visit.    Allergies:  Allergies  Allergen Reactions   Sulfa Antibiotics Hives    Social History:  Social History   Socioeconomic History   Marital status: Married    Spouse  name: Not on file   Number of children: Not on file   Years of education: Not on file   Highest education level: Not on file  Occupational History   Not on file  Tobacco Use   Smoking status: Never   Smokeless tobacco: Never  Vaping Use   Vaping Use: Never used  Substance and Sexual Activity   Alcohol use: No   Drug use: No   Sexual activity: Yes    Birth control/protection: I.U.D.  Other Topics Concern   Not on file  Social History Narrative   Not on file   Social Determinants of Health   Financial Resource Strain: Not on file  Food Insecurity: Not on file  Transportation Needs: Not on file  Physical Activity: Not on file  Stress: Not on file  Social Connections: Not on file  Intimate Partner Violence: Not on file   Social History   Tobacco Use  Smoking Status Never  Smokeless Tobacco Never   Social History   Substance and Sexual Activity  Alcohol Use No    Family History:  Family History  Problem Relation Age of Onset   Diabetes Father    Heart disease Father    Hypertension Father    Hyperlipidemia Father    Thyroid disease Mother    Cancer Maternal Grandmother        uterine   Breast cancer Maternal Grandmother 73   Thyroid disease Maternal Grandmother    Lung cancer Maternal Uncle     Past medical history, surgical history, medications, allergies, family history and social history reviewed with patient today and changes made to appropriate areas of the chart.   Review of Systems  Constitutional: Negative.   HENT: Negative.    Eyes: Negative.   Respiratory: Negative.    Cardiovascular: Negative.   Gastrointestinal:  Positive for abdominal pain and diarrhea. Negative for blood in stool, constipation, heartburn, melena, nausea and vomiting.  Musculoskeletal:  Positive for myalgias. Negative for back pain, falls, joint pain and neck pain.  Skin: Negative.   Neurological:  Positive for tingling and headaches. Negative for dizziness, tremors,  sensory change, speech change, focal weakness, seizures, loss of consciousness and weakness.  Psychiatric/Behavioral: Negative.    All other ROS negative except what is listed above and in the HPI.      Objective:    BP 124/87   Pulse 82   Temp 97.8 F (36.6 C)   Ht 5' 2.28" (1.582 m)   Wt 150 lb 4 oz (68.2 kg)   SpO2 97%  BMI 27.23 kg/m   Wt Readings from Last 3 Encounters:  12/16/20 150 lb 4 oz (68.2 kg)  07/06/20 156 lb 6 oz (70.9 kg)  06/08/20 158 lb 9.6 oz (71.9 kg)    Physical Exam Vitals and nursing note reviewed.  Constitutional:      General: She is not in acute distress.    Appearance: Normal appearance. She is well-developed. She is not ill-appearing, toxic-appearing or diaphoretic.  HENT:     Head: Normocephalic and atraumatic.     Right Ear: Tympanic membrane, ear canal and external ear normal. There is no impacted cerumen.     Left Ear: Tympanic membrane, ear canal and external ear normal. There is no impacted cerumen.     Nose: Nose normal. No congestion or rhinorrhea.     Mouth/Throat:     Mouth: Mucous membranes are moist.     Pharynx: Oropharynx is clear. No oropharyngeal exudate or posterior oropharyngeal erythema.  Eyes:     General: No scleral icterus.       Right eye: No discharge.        Left eye: No discharge.     Extraocular Movements: Extraocular movements intact.     Conjunctiva/sclera: Conjunctivae normal.     Pupils: Pupils are equal, round, and reactive to light.  Neck:     Vascular: No carotid bruit.  Cardiovascular:     Rate and Rhythm: Normal rate and regular rhythm.     Pulses: Normal pulses.     Heart sounds: No murmur heard.   No friction rub. No gallop.  Pulmonary:     Effort: Pulmonary effort is normal. No respiratory distress.     Breath sounds: Normal breath sounds. No stridor. No wheezing, rhonchi or rales.  Chest:     Chest wall: No tenderness.  Abdominal:     General: Abdomen is flat. Bowel sounds are normal. There is  no distension.     Palpations: Abdomen is soft. There is no mass.     Tenderness: There is no abdominal tenderness. There is no right CVA tenderness, left CVA tenderness, guarding or rebound.     Hernia: No hernia is present.  Genitourinary:    Comments: Breast and pelvic exams deferred with shared decision making Musculoskeletal:        General: No swelling, tenderness, deformity or signs of injury.     Cervical back: Normal range of motion and neck supple. No rigidity. No muscular tenderness.     Right lower leg: No edema.     Left lower leg: No edema.  Lymphadenopathy:     Cervical: No cervical adenopathy.  Skin:    General: Skin is warm and dry.     Capillary Refill: Capillary refill takes less than 2 seconds.     Coloration: Skin is not jaundiced or pale.     Findings: No bruising, erythema, lesion or rash.  Neurological:     General: No focal deficit present.     Mental Status: She is alert and oriented to person, place, and time. Mental status is at baseline.     Cranial Nerves: No cranial nerve deficit.     Sensory: No sensory deficit.     Motor: No weakness.     Coordination: Coordination normal.     Gait: Gait normal.     Deep Tendon Reflexes: Reflexes normal.  Psychiatric:        Mood and Affect: Mood normal.        Behavior: Behavior normal.  Thought Content: Thought content normal.        Judgment: Judgment normal.    Results for orders placed or performed in visit on 12/16/20  Urinalysis, Routine w reflex microscopic  Result Value Ref Range   Specific Gravity, UA 1.020 1.005 - 1.030   pH, UA 7.0 5.0 - 7.5   Color, UA Yellow Yellow   Appearance Ur Clear Clear   Leukocytes,UA Negative Negative   Protein,UA Negative Negative/Trace   Glucose, UA Negative Negative   Ketones, UA Negative Negative   RBC, UA Negative Negative   Bilirubin, UA Negative Negative   Urobilinogen, Ur 0.2 0.2 - 1.0 mg/dL   Nitrite, UA Negative Negative  Bayer DCA Hb A1c Waived   Result Value Ref Range   HB A1C (BAYER DCA - WAIVED) 5.7 <7.0 %      Assessment & Plan:   Problem List Items Addressed This Visit       Cardiovascular and Mediastinum   Migraine without aura and without status migrainosus, not intractable    Under good control on current regimen. Continue current regimen. Continue to monitor. Call with any concerns. Refills given.        Relevant Medications   nortriptyline (PAMELOR) 10 MG capsule   topiramate (TOPAMAX) 25 MG tablet   SUMAtriptan (IMITREX) 100 MG tablet   pravastatin (PRAVACHOL) 20 MG tablet     Endocrine   IFG (impaired fasting glucose)    Labs drawn today. Await results. Treat as needed.       Relevant Orders   Bayer DCA Hb A1c Waived (Completed)     Nervous and Auditory   Postherpetic neuralgia    Dong better with the nortriptyline. Will increase to 44m and see how she does. Call with any concerns. Refills given today.        Other   High cholesterol    Under good control on current regimen. Continue current regimen. Continue to monitor. Call with any concerns. Refills given. Labs drawn today.       Relevant Medications   pravastatin (PRAVACHOL) 20 MG tablet   Other Relevant Orders   Lipid Panel w/o Chol/HDL Ratio   Vitamin D deficiency    Rechecking labs today. Await results. Treat as needed.      Relevant Orders   VITAMIN D 25 Hydroxy (Vit-D Deficiency, Fractures)   Other Visit Diagnoses     Routine general medical examination at a health care facility    -  Primary   Vaccines up to date. Screening labs checked today. Pap through GYN. Mammo and cologuard up to date. Continue diet and exercise. Call with concerns.    Relevant Orders   CBC with Differential/Platelet   Comprehensive metabolic panel   Lipid Panel w/o Chol/HDL Ratio   Urinalysis, Routine w reflex microscopic (Completed)   TSH   Bayer DCA Hb A1c Waived (Completed)   Hepatitis C Antibody   Encounter for screening mammogram for  malignant neoplasm of breast       Mammogram odered.   Relevant Orders   MM DIAG BREAST TOMO BILATERAL        Follow up plan: Return in about 6 months (around 06/18/2021).   LABORATORY TESTING:  - Pap smear: done elsewhere  IMMUNIZATIONS:   - Tdap: Tetanus vaccination status reviewed: last tetanus booster within 10 years. - Influenza: Postponed to flu season - Pneumovax: Not applicable - Prevnar: Not applicable - COVID: Up to date  SCREENING: -Mammogram: Up to date  - Colonoscopy:  up to date  PATIENT COUNSELING:   Advised to take 1 mg of folate supplement per day if capable of pregnancy.   Sexuality: Discussed sexually transmitted diseases, partner selection, use of condoms, avoidance of unintended pregnancy  and contraceptive alternatives.   Advised to avoid cigarette smoking.  I discussed with the patient that most people either abstain from alcohol or drink within safe limits (<=14/week and <=4 drinks/occasion for males, <=7/weeks and <= 3 drinks/occasion for females) and that the risk for alcohol disorders and other health effects rises proportionally with the number of drinks per week and how often a drinker exceeds daily limits.  Discussed cessation/primary prevention of drug use and availability of treatment for abuse.   Diet: Encouraged to adjust caloric intake to maintain  or achieve ideal body weight, to reduce intake of dietary saturated fat and total fat, to limit sodium intake by avoiding high sodium foods and not adding table salt, and to maintain adequate dietary potassium and calcium preferably from fresh fruits, vegetables, and low-fat dairy products.    stressed the importance of regular exercise  Injury prevention: Discussed safety belts, safety helmets, smoke detector, smoking near bedding or upholstery.   Dental health: Discussed importance of regular tooth brushing, flossing, and dental visits.    NEXT PREVENTATIVE PHYSICAL DUE IN 1 YEAR. Return in  about 6 months (around 06/18/2021).

## 2020-12-16 NOTE — Assessment & Plan Note (Signed)
Dong better with the nortriptyline. Will increase to 58m and see how she does. Call with any concerns. Refills given today.

## 2020-12-17 LAB — COMPREHENSIVE METABOLIC PANEL
ALT: 15 IU/L (ref 0–32)
AST: 15 IU/L (ref 0–40)
Albumin/Globulin Ratio: 2.6 — ABNORMAL HIGH (ref 1.2–2.2)
Albumin: 5.1 g/dL — ABNORMAL HIGH (ref 3.8–4.8)
Alkaline Phosphatase: 67 IU/L (ref 44–121)
BUN/Creatinine Ratio: 14 (ref 9–23)
BUN: 13 mg/dL (ref 6–24)
Bilirubin Total: 0.3 mg/dL (ref 0.0–1.2)
CO2: 20 mmol/L (ref 20–29)
Calcium: 9.8 mg/dL (ref 8.7–10.2)
Chloride: 104 mmol/L (ref 96–106)
Creatinine, Ser: 0.9 mg/dL (ref 0.57–1.00)
Globulin, Total: 2 g/dL (ref 1.5–4.5)
Glucose: 99 mg/dL (ref 65–99)
Potassium: 4 mmol/L (ref 3.5–5.2)
Sodium: 142 mmol/L (ref 134–144)
Total Protein: 7.1 g/dL (ref 6.0–8.5)
eGFR: 78 mL/min/{1.73_m2} (ref >59)

## 2020-12-17 LAB — HEPATITIS C ANTIBODY: Hep C Virus Ab: <0.1 s/co ratio (ref 0.0–0.9)

## 2020-12-17 LAB — CBC WITH DIFFERENTIAL/PLATELET
Basophils Absolute: 0 10*3/uL (ref 0.0–0.2)
Basos: 1 %
EOS (ABSOLUTE): 0.1 10*3/uL (ref 0.0–0.4)
Eos: 1 %
Hematocrit: 42.3 % (ref 34.0–46.6)
Hemoglobin: 14 g/dL (ref 11.1–15.9)
Immature Grans (Abs): 0 10*3/uL (ref 0.0–0.1)
Immature Granulocytes: 0 %
Lymphocytes Absolute: 2.5 10*3/uL (ref 0.7–3.1)
Lymphs: 39 %
MCH: 28.6 pg (ref 26.6–33.0)
MCHC: 33.1 g/dL (ref 31.5–35.7)
MCV: 87 fL (ref 79–97)
Monocytes Absolute: 0.4 10*3/uL (ref 0.1–0.9)
Monocytes: 6 %
Neutrophils Absolute: 3.5 10*3/uL (ref 1.4–7.0)
Neutrophils: 53 %
Platelets: 327 10*3/uL (ref 150–450)
RBC: 4.89 x10E6/uL (ref 3.77–5.28)
RDW: 13.3 % (ref 11.7–15.4)
WBC: 6.4 10*3/uL (ref 3.4–10.8)

## 2020-12-17 LAB — LIPID PANEL W/O CHOL/HDL RATIO
Cholesterol, Total: 176 mg/dL (ref 100–199)
HDL: 69 mg/dL (ref >39)
LDL Chol Calc (NIH): 94 mg/dL (ref 0–99)
Triglycerides: 67 mg/dL (ref 0–149)
VLDL Cholesterol Cal: 13 mg/dL (ref 5–40)

## 2020-12-17 LAB — VITAMIN D 25 HYDROXY (VIT D DEFICIENCY, FRACTURES): Vit D, 25-Hydroxy: 47.4 ng/mL (ref 30.0–100.0)

## 2020-12-17 LAB — TSH: TSH: 2.43 u[IU]/mL (ref 0.450–4.500)

## 2021-01-06 ENCOUNTER — Ambulatory Visit: Payer: 59 | Admitting: Family Medicine

## 2021-01-10 ENCOUNTER — Encounter: Payer: 59 | Admitting: Family Medicine

## 2021-01-10 ENCOUNTER — Ambulatory Visit: Payer: 59 | Admitting: Family Medicine

## 2021-03-31 ENCOUNTER — Other Ambulatory Visit: Payer: Self-pay | Admitting: Family Medicine

## 2021-03-31 DIAGNOSIS — Z1231 Encounter for screening mammogram for malignant neoplasm of breast: Secondary | ICD-10-CM

## 2021-04-14 ENCOUNTER — Encounter: Payer: 59 | Admitting: Obstetrics and Gynecology

## 2021-04-15 ENCOUNTER — Other Ambulatory Visit: Payer: Self-pay

## 2021-04-15 ENCOUNTER — Encounter: Payer: Self-pay | Admitting: Obstetrics and Gynecology

## 2021-04-15 ENCOUNTER — Ambulatory Visit (INDEPENDENT_AMBULATORY_CARE_PROVIDER_SITE_OTHER): Payer: 59 | Admitting: Obstetrics and Gynecology

## 2021-04-15 VITALS — BP 128/77 | HR 78 | Resp 16 | Ht 63.0 in | Wt 145.8 lb

## 2021-04-15 DIAGNOSIS — Z975 Presence of (intrauterine) contraceptive device: Secondary | ICD-10-CM | POA: Diagnosis not present

## 2021-04-15 DIAGNOSIS — Z01419 Encounter for gynecological examination (general) (routine) without abnormal findings: Secondary | ICD-10-CM

## 2021-04-15 DIAGNOSIS — Z8742 Personal history of other diseases of the female genital tract: Secondary | ICD-10-CM | POA: Diagnosis not present

## 2021-04-15 DIAGNOSIS — E785 Hyperlipidemia, unspecified: Secondary | ICD-10-CM

## 2021-04-15 NOTE — Progress Notes (Signed)
GYNECOLOGY ANNUAL PHYSICAL EXAM PROGRESS NOTE  Subjective:    Judy Munoz is a 50 y.o. G62P1001 female who presents for an annual exam. The patient has no complaints today. The patient is sexually active. The patient participates in regular exercise: yes. [facility] when it is warm.) Has the patient ever been transfused or tattooed?: no. The patient reports that there is not domestic violence in her life.    Gynecological History Menarche age: 57 No LMP recorded. (Menstrual status: IUD). Contraception: IUD (Mirena inserted 05/08/2019) History of STI's: Denies Last Pap: 04/13/2020. Results were: normal. Denies h/o abnormal pap smears. Last mammogram: 05/13/2020. Results were: normal. Last colonoscopy: Has never had one. Had Cologuard done in March 2022.    OB History  Gravida Para Term Preterm AB Living  1 1 1  0 0 1  SAB IAB Ectopic Multiple Live Births  0 0 0 0 1    # Outcome Date GA Lbr Len/2nd Weight Sex Delivery Anes PTL Lv  1 Term 2014 [redacted]w[redacted]d 5 lb 12 oz (2.608 kg) F Vag-Spont EPI N LIV     Name: LMendel Ryder    Past Medical History:  Diagnosis Date   Allergy Seasonal   Anemia    H/O WITH PREGNANCY   Asthma    Biliary dyskinesia    Elevated glucose    GERD (gastroesophageal reflux disease)    Headache    MIGRAINES RARE   Heart murmur    WAS TOLD THIS ONCE YEARS AGO-HAS NEVER BEEN TOLD SINCE   High cholesterol    History of uterine fibroid    IFG (impaired fasting glucose)    PONV (postoperative nausea and vomiting)    HAPPENED WITH HER 1ST EGD- HER 2ND EGD SHE WAS GIVEN ANTI-EMETIC IV AND HAD NO N/V   Trapezius muscle strain    Vitamin D deficiency     Past Surgical History:  Procedure Laterality Date   CHOLECYSTECTOMY N/A 07/18/2016   Procedure: LAPAROSCOPIC CHOLECYSTECTOMY;  Surgeon: DJules Husbands MD;  Location: [facility];  Service: General;  Laterality: N/A;   CHOLECYSTECTOMY, LAPAROSCOPIC  07/18/2016   ESOPHAGOGASTRODUODENOSCOPY      ESOPHAGOGASTRODUODENOSCOPY (EGD) WITH PROPOFOL N/A 07/26/2015   Procedure: ESOPHAGOGASTRODUODENOSCOPY (EGD) WITH PROPOFOL;  Surgeon: DLucilla Lame MD;  [place]  Service: Endoscopy;  Laterality: N/A;    Family History  Problem Relation Age of Onset   Diabetes Father    Heart disease Father    Hypertension Father    Hyperlipidemia Father    Thyroid disease Mother    Cancer Maternal Grandmother        uterine   Breast cancer Maternal Grandmother 541  Thyroid disease Maternal Grandmother    Lung cancer Maternal Uncle     Social History   Socioeconomic History   Marital status: Married    Spouse name: Not on file   Number of children: Not on file   Years of education: Not on file   Highest education level: Not on file  Occupational History   Not on file  Tobacco Use   Smoking status: Never   Smokeless tobacco: Never  Vaping Use   Vaping Use: Never used  Substance and Sexual Activity   Alcohol use: No   Drug use: No   Sexual activity: Yes    Birth control/protection: I.U.D.  Other Topics Concern   Not on file  Social History Narrative   Not on file   Social Determinants of Health  Financial Resource Strain: Not on file  Food Insecurity: Not on file  Transportation Needs: Not on file  Physical Activity: Not on file  Stress: Not on file  Social Connections: Not on file  Intimate Partner Violence: Not on file    Current Outpatient Medications on File Prior to Visit  Medication Sig Dispense Refill   albuterol (VENTOLIN HFA) 108 (90 Base) MCG/ACT inhaler TAKE 2 PUFFS BY MOUTH EVERY 6 HOURS AS NEEDED FOR WHEEZE OR SHORTNESS OF BREATH 18 g 6   cetirizine (ZYRTEC) 10 MG tablet Take 10 mg by mouth daily.     Cholecalciferol (VITAMIN D-400 PO) Take 800 mg by mouth.      fluticasone (FLONASE) 50 MCG/ACT nasal spray Place into the nose.     ibuprofen (ADVIL) 800 MG tablet Take 1 tablet (800 mg total) by mouth every 8 (eight) hours as needed. 90 tablet 3    levonorgestrel (MIRENA) 20 MCG/24HR IUD by Intrauterine route once.      nortriptyline (PAMELOR) 10 MG capsule Take 1-2 capsules (10-20 mg total) by mouth at bedtime. 180 capsule 1   ondansetron (ZOFRAN) 8 MG tablet Take 1 tablet (8 mg total) by mouth every 8 (eight) hours as needed for nausea or vomiting. 20 tablet 0   pantoprazole (PROTONIX) 40 MG tablet Take 1 tablet (40 mg total) by mouth 2 (two) times daily. 180 tablet 1   pravastatin (PRAVACHOL) 20 MG tablet TAKE 1 TABLET BY MOUTH EVERYDAY AT BEDTIME 90 tablet 1   topiramate (TOPAMAX) 25 MG tablet Take 1-2 tablets (25-50 mg total) by mouth 2 (two) times daily. 360 tablet 1   No current facility-administered medications on file prior to visit.    Allergies  Allergen Reactions   Sulfa Antibiotics Hives     Review of Systems Constitutional: negative for chills, fatigue, fevers and sweats Eyes: negative for irritation, redness and visual disturbance Ears, nose, mouth, throat, and face: negative for hearing loss, nasal congestion, snoring and tinnitus Respiratory: negative for asthma, cough, sputum Cardiovascular: negative for chest pain, dyspnea, exertional chest pressure/discomfort, irregular heart beat, palpitations and syncope Gastrointestinal: negative for abdominal pain, change in bowel habits, nausea and vomiting Genitourinary: negative for abnormal menstrual periods, genital lesions, sexual problems and vaginal discharge, dysuria and urinary incontinence Integument/breast: negative for breast lump, breast tenderness and nipple discharge Hematologic/lymphatic: negative for bleeding and easy bruising Musculoskeletal:negative for back pain and muscle weakness Neurological: negative for dizziness, headaches, vertigo and weakness Endocrine: negative for diabetic symptoms including polydipsia, polyuria and skin dryness Allergic/Immunologic: negative for hay fever and urticaria      Objective:  Blood pressure 128/77, pulse 78,  resp. rate 16, height 5' 3"  (1.6 m), weight 145 lb 12.8 oz (66.1 kg).  Body mass index is 25.83 kg/m.  General Appearance:    Alert, cooperative, no distress, appears stated age  Head:    Normocephalic, without obvious abnormality, atraumatic  Eyes:    PERRL, conjunctiva/corneas clear, EOM's intact, both eyes  Ears:    Normal external ear canals, both ears  Nose:   Nares normal, septum midline, mucosa normal, no drainage or sinus tenderness  Throat:   Lips, mucosa, and tongue normal; teeth and gums normal  Neck:   Supple, symmetrical, trachea midline, no adenopathy; thyroid: no enlargement/tenderness/nodules; no carotid bruit or JVD  Back:     Symmetric, no curvature, ROM normal, no CVA tenderness  Lungs:     Clear to auscultation bilaterally, respirations unlabored  Chest Wall:    No  tenderness or deformity   Heart:    Regular rate and rhythm, S1 and S2 normal, no murmur, rub or gallop  Breast Exam:    No tenderness, masses, or nipple abnormality  Abdomen:     Soft, non-tender, bowel sounds active all four quadrants, no masses, no organomegaly.    Genitalia:    Pelvic:external genitalia normal, vagina without lesions, discharge, or tenderness, rectovaginal septum  normal. Cervix normal in appearance, no cervical motion tenderness, no adnexal masses or tenderness.  Uterus normal size, shape, mobile, regular contours, nontender.  Rectal:    Normal external sphincter.  No hemorrhoids appreciated. Internal exam not done.   Extremities:   Extremities normal, atraumatic, no cyanosis or edema  Pulses:   2+ and symmetric all extremities  Skin:   Skin color, texture, turgor normal, no rashes or lesions  Lymph nodes:   Cervical, supraclavicular, and axillary nodes normal  Neurologic:   CNII-XII intact, normal strength, sensation and reflexes throughout   .  Labs:  Lab Results  Component Value Date   WBC 6.4 12/16/2020   HGB 14.0 12/16/2020   HCT 42.3 12/16/2020   MCV 87 12/16/2020   PLT 327  12/16/2020    Lab Results  Component Value Date   CREATININE 0.90 12/16/2020   BUN 13 12/16/2020   NA 142 12/16/2020   K 4.0 12/16/2020   CL 104 12/16/2020   CO2 20 12/16/2020    Lab Results  Component Value Date   ALT 15 12/16/2020   AST 15 12/16/2020   ALKPHOS 67 12/16/2020   BILITOT 0.3 12/16/2020    Lab Results  Component Value Date   TSH 2.430 12/16/2020     Assessment:   1. Encounter for well woman exam with routine gynecological exam   2. Dyslipidemia   3. History of menorrhagia   4. IUD (intrauterine device) in place      Plan:  Blood tests: Done by PCP on Aug. 18, 2023. Nml labs Breast self exam technique reviewed and patient encouraged to perform self-exam monthly. Contraception: IUD. Due for removal in 7 years.   Discussed healthy lifestyle modifications. Mammogram  scheduled for next month.  Pap smear  UTD . Colon screening: UTD. Dyslipidemia managemd by PCP History of menorrhagia, managed with IUD.  COVID vaccination status:UTD Flu vaccine: UTD.  Follow up in 1 year for annual exam   Rubie Maid, MD [facility]

## 2021-04-15 NOTE — Patient Instructions (Addendum)

## 2021-05-17 ENCOUNTER — Ambulatory Visit
Admission: RE | Admit: 2021-05-17 | Discharge: 2021-05-17 | Disposition: A | Discharge status: Home / Self Care | Payer: 59 | Source: Ambulatory Visit | Attending: Family Medicine | Admitting: Family Medicine

## 2021-05-17 ENCOUNTER — Other Ambulatory Visit: Payer: Self-pay

## 2021-05-17 DIAGNOSIS — Z1231 Encounter for screening mammogram for malignant neoplasm of breast: Secondary | ICD-10-CM | POA: Diagnosis not present

## 2021-05-17 NOTE — Progress Notes (Signed)
Contacted via [place] afternoon [place], mammogram is normal.

## 2021-06-09 ENCOUNTER — Ambulatory Visit (INDEPENDENT_AMBULATORY_CARE_PROVIDER_SITE_OTHER): Payer: 59 | Admitting: Family Medicine

## 2021-06-09 ENCOUNTER — Other Ambulatory Visit: Payer: Self-pay

## 2021-06-09 ENCOUNTER — Encounter: Payer: Self-pay | Admitting: Family Medicine

## 2021-06-09 VITALS — BP 106/72 | HR 76 | Temp 98.3°F | Ht 63.0 in | Wt 141.0 lb

## 2021-06-09 DIAGNOSIS — R634 Abnormal weight loss: Secondary | ICD-10-CM

## 2021-06-09 DIAGNOSIS — E559 Vitamin D deficiency, unspecified: Secondary | ICD-10-CM

## 2021-06-09 DIAGNOSIS — G43009 Migraine without aura, not intractable, without status migrainosus: Secondary | ICD-10-CM | POA: Diagnosis not present

## 2021-06-09 DIAGNOSIS — E7800 Pure hypercholesterolemia, unspecified: Secondary | ICD-10-CM

## 2021-06-09 DIAGNOSIS — R7301 Impaired fasting glucose: Secondary | ICD-10-CM

## 2021-06-09 DIAGNOSIS — K219 Gastro-esophageal reflux disease without esophagitis: Secondary | ICD-10-CM

## 2021-06-09 DIAGNOSIS — J4520 Mild intermittent asthma, uncomplicated: Secondary | ICD-10-CM | POA: Diagnosis not present

## 2021-06-09 DIAGNOSIS — H6982 Other specified disorders of Eustachian tube, left ear: Secondary | ICD-10-CM

## 2021-06-09 LAB — BAYER DCA HB A1C WAIVED: HB A1C (BAYER DCA - WAIVED): 5.9 % — ABNORMAL HIGH (ref 4.8–5.6)

## 2021-06-09 MED ORDER — PRAVASTATIN SODIUM 20 MG PO TABS: ORAL_TABLET | ORAL | 6 refills | Status: DC

## 2021-06-09 MED ORDER — PANTOPRAZOLE SODIUM 40 MG PO TBEC: 40.0000 mg | DELAYED_RELEASE_TABLET | Freq: Two times a day (BID) | ORAL | 6 refills | Status: DC

## 2021-06-09 MED ORDER — FLUTICASONE PROPIONATE 50 MCG/ACT NA SUSP: 2.0000 | Freq: Every day | NASAL | 12 refills | Status: DC

## 2021-06-09 MED ORDER — ALBUTEROL SULFATE HFA 108 (90 BASE) MCG/ACT IN AERS: INHALATION_SPRAY | RESPIRATORY_TRACT | 6 refills | Status: DC

## 2021-06-09 MED ORDER — PREDNISONE 50 MG PO TABS: 50.0000 mg | ORAL_TABLET | Freq: Every day | ORAL | 0 refills | Status: DC

## 2021-06-09 MED ORDER — TOPIRAMATE 25 MG PO TABS: 25.0000 mg | ORAL_TABLET | Freq: Two times a day (BID) | ORAL | 6 refills | Status: DC

## 2021-06-09 MED ORDER — IBUPROFEN 800 MG PO TABS: 800.0000 mg | ORAL_TABLET | Freq: Three times a day (TID) | ORAL | 6 refills | Status: DC | PRN

## 2021-06-09 MED ORDER — NORTRIPTYLINE HCL 10 MG PO CAPS: 10.0000 mg | ORAL_CAPSULE | Freq: Every day | ORAL | 6 refills | Status: DC

## 2021-06-09 NOTE — Assessment & Plan Note (Signed)
Rechecking labs today. Await results. Treat as needed.  °

## 2021-06-09 NOTE — Assessment & Plan Note (Signed)
Under good control on current regimen. Continue current regimen. Continue to monitor. Call with any concerns. Refills given. Labs drawn today.   

## 2021-06-09 NOTE — Assessment & Plan Note (Signed)
Under good control on current regimen. Continue current regimen. Continue to monitor. Call with any concerns. Refills given.   

## 2021-06-09 NOTE — Progress Notes (Signed)
BP 106/72    Pulse 76    Temp 98.3 F (36.8 C) (Oral)    Ht _0  (1.6 m)    Wt 141 lb (64 kg)    SpO2 100%    BMI 24.98 kg/m    Subjective:    Patient ID: Judy Munoz, female    DOB: Nov 24, 1970, 51 y.o.   MRN: [id number]  HPI: Judy Munoz is a 51 y.o. female  Chief Complaint  Patient presents with   Migraine   Ear Fullness    Patient states she would like to have her left ear check as she is experiencing some fullness and it is causing her some off balance when she is laying down and getting up or when she stands up from laying down she has dizziness. Patient first noticed it about a couple of days ago. Patient states she trid Sudafed to release some sinus pressure and it helped a little bit.    Migraines have been doing OK. She had one last night, but that's only 2 in the last 6 months.   HYPERLIPIDEMIA Hyperlipidemia status: excellent compliance Satisfied with current treatment?  yes Side effects:  no Medication compliance: excellent compliance Past cholesterol meds: pravastatin Supplements: none Aspirin:  no The 10-year ASCVD risk score (Arnett DK, et al., 2019) is: 0.5%   Values used to calculate the score:     Age: 32 years     Sex: Female     Is Non-Hispanic African American: No     Diabetic: No     Tobacco smoker: No     Systolic Blood Pressure: 875 mmHg     Is BP treated: No     HDL Cholesterol: 69 mg/dL     Total Cholesterol: 176 mg/dL Chest pain:  no Coronary artery disease:  no  Impaired Fasting Glucose HbA1C:  Lab Results  Component Value Date   HGBA1C 5.7 12/16/2020   Duration of elevated blood sugar: chronic Polydipsia: no Polyuria: no Weight change: no Visual disturbance: no Glucose Monitoring: no Diabetic Education: Not Completed Family history of diabetes: yes  EAG CLOGGED Duration: 4 days Involved ear(s): yes left Sensation of feeling clogged/plugged: yes Decreased/muffled hearing:yes Ear pain: no Fever: no Otorrhea:  no Hearing loss: yes Upper respiratory infection symptoms: yes Using Q-Tips: no Status: worse History of cerumenosis: no Treatments attempted: [email or web address]  Relevant past medical, surgical, family and social history reviewed and updated as indicated. Interim medical history since our last visit reviewed. Allergies and medications reviewed and updated.  Review of Systems  Constitutional: Negative.   HENT:  Positive for congestion, ear pain, postnasal drip, rhinorrhea and voice change. Negative for dental problem, drooling, ear discharge, facial swelling, hearing loss, mouth sores, nosebleeds, sinus pressure, sinus pain, sneezing, sore throat, tinnitus and trouble swallowing.   Eyes: Negative.   Respiratory: Negative.    Cardiovascular: Negative.   Psychiatric/Behavioral: Negative.     Per HPI unless specifically indicated above     Objective:    BP 106/72    Pulse 76    Temp 98.3 F (36.8 C) (Oral)    Ht _1  (1.6 m)    Wt 141 lb (64 kg)    SpO2 100%    BMI 24.98 kg/m   Wt Readings from Last 3 Encounters:  06/09/21 141 lb (64 kg)  04/15/21 145 lb 12.8 oz (66.1 kg)  12/16/20 150 lb 4 oz (68.2 kg)    Physical Exam Vitals and nursing note reviewed.  Constitutional:      General: She is not in acute distress.    Appearance: Normal appearance. She is not ill-appearing, toxic-appearing or diaphoretic.  HENT:     Head: Normocephalic and atraumatic.     Right Ear: Tympanic membrane, ear canal and external ear normal.     Left Ear: Ear canal and external ear normal. A middle ear effusion is present. Tympanic membrane is bulging.     Nose: Nose normal.     Mouth/Throat:     Mouth: Mucous membranes are moist.     Pharynx: Oropharynx is clear.  Eyes:     General: No scleral icterus.       Right eye: No discharge.        Left eye: No discharge.     Extraocular Movements: Extraocular movements intact.     Conjunctiva/sclera: Conjunctivae normal.     Pupils: Pupils are equal, round,  and reactive to light.  Cardiovascular:     Rate and Rhythm: Normal rate and regular rhythm.     Pulses: Normal pulses.     Heart sounds: Normal heart sounds. No murmur heard.   No friction rub. No gallop.  Pulmonary:     Effort: Pulmonary effort is normal. No respiratory distress.     Breath sounds: Normal breath sounds. No stridor. No wheezing, rhonchi or rales.  Chest:     Chest wall: No tenderness.  Musculoskeletal:        General: Normal range of motion.     Cervical back: Normal range of motion and neck supple.  Skin:    General: Skin is warm and dry.     Capillary Refill: Capillary refill takes less than 2 seconds.     Coloration: Skin is not jaundiced or pale.     Findings: No bruising, erythema, lesion or rash.  Neurological:     General: No focal deficit present.     Mental Status: She is alert and oriented to person, place, and time. Mental status is at baseline.  Psychiatric:        Mood and Affect: Mood normal.        Behavior: Behavior normal.        Thought Content: Thought content normal.        Judgment: Judgment normal.    Results for orders placed or performed in visit on 12/16/20  CBC with Differential/Platelet  Result Value Ref Range   WBC 6.4 3.4 - 10.8 x10E3/uL   RBC 4.89 3.77 - 5.28 x10E6/uL   Hemoglobin 14.0 11.1 - 15.9 g/dL   Hematocrit 42.3 34.0 - 46.6 %   MCV 87 79 - 97 fL   MCH 28.6 26.6 - 33.0 pg   MCHC 33.1 31.5 - 35.7 g/dL   RDW 13.3 11.7 - 15.4 %   Platelets 327 150 - 450 x10E3/uL   Neutrophils 53 Not Estab. %   Lymphs 39 Not Estab. %   Monocytes 6 Not Estab. %   Eos 1 Not Estab. %   Basos 1 Not Estab. %   Neutrophils Absolute 3.5 1.4 - 7.0 x10E3/uL   Lymphocytes Absolute 2.5 0.7 - 3.1 x10E3/uL   Monocytes Absolute 0.4 0.1 - 0.9 x10E3/uL   EOS (ABSOLUTE) 0.1 0.0 - 0.4 x10E3/uL   Basophils Absolute 0.0 0.0 - 0.2 x10E3/uL   Immature Granulocytes 0 Not Estab. %   Immature Grans (Abs) 0.0 0.0 - 0.1 x10E3/uL  Comprehensive metabolic  panel  Result Value Ref Range   Glucose 99 65 -  99 mg/dL   BUN 13 6 - 24 mg/dL   Creatinine, Ser 0.90 0.57 - 1.00 mg/dL   eGFR 78 >59 mL/min/1.73   BUN/Creatinine Ratio 14 9 - 23   Sodium 142 134 - 144 mmol/L   Potassium 4.0 3.5 - 5.2 mmol/L   Chloride 104 96 - 106 mmol/L   CO2 20 20 - 29 mmol/L   Calcium 9.8 8.7 - 10.2 mg/dL   Total Protein 7.1 6.0 - 8.5 g/dL   Albumin 5.1 (H) 3.8 - 4.8 g/dL   Globulin, Total 2.0 1.5 - 4.5 g/dL   Albumin/Globulin Ratio 2.6 (H) 1.2 - 2.2   Bilirubin Total 0.3 0.0 - 1.2 mg/dL   Alkaline Phosphatase 67 44 - 121 IU/L   AST 15 0 - 40 IU/L   ALT 15 0 - 32 IU/L  Lipid Panel w/o Chol/HDL Ratio  Result Value Ref Range   Cholesterol, Total 176 100 - 199 mg/dL   Triglycerides 67 0 - 149 mg/dL   HDL 69 >39 mg/dL   VLDL Cholesterol Cal 13 5 - 40 mg/dL   LDL Chol Calc (NIH) 94 0 - 99 mg/dL  Urinalysis, Routine w reflex microscopic  Result Value Ref Range   Specific Gravity, UA 1.020 1.005 - 1.030   pH, UA 7.0 5.0 - 7.5   Color, UA Yellow Yellow   Appearance Ur Clear Clear   Leukocytes,UA Negative Negative   Protein,UA Negative Negative/Trace   Glucose, UA Negative Negative   Ketones, UA Negative Negative   RBC, UA Negative Negative   Bilirubin, UA Negative Negative   Urobilinogen, Ur 0.2 0.2 - 1.0 mg/dL   Nitrite, UA Negative Negative  TSH  Result Value Ref Range   TSH 2.430 0.450 - 4.500 uIU/mL  Bayer DCA Hb A1c Waived  Result Value Ref Range   HB A1C (BAYER DCA - WAIVED) 5.7 <7.0 %  VITAMIN D 25 Hydroxy (Vit-D Deficiency, Fractures)  Result Value Ref Range   Vit D, 25-Hydroxy 47.4 30.0 - 100.0 ng/mL  Hepatitis C Antibody  Result Value Ref Range   Hep C Virus Ab <0.1 0.0 - 0.9 s/co ratio      Assessment & Plan:   Problem List Items Addressed This Visit       Cardiovascular and Mediastinum   Migraine without aura and without status migrainosus, not intractable    Doing very well. Continue current regimen. Continue to monitor. Refills  given today.      Relevant Medications   topiramate (TOPAMAX) 25 MG tablet   pravastatin (PRAVACHOL) 20 MG tablet   nortriptyline (PAMELOR) 10 MG capsule   ibuprofen (ADVIL) 800 MG tablet   Other Relevant Orders   Comprehensive metabolic panel   CBC with Differential/Platelet     Respiratory   Reactive airway disease    Under good control on current regimen. Continue current regimen. Continue to monitor. Call with any concerns. Refills given.          Digestive   GERD (gastroesophageal reflux disease)    Under good control on current regimen. Continue current regimen. Continue to monitor. Call with any concerns. Refills given.        Relevant Medications   pantoprazole (PROTONIX) 40 MG tablet   Other Relevant Orders   Comprehensive metabolic panel   CBC with Differential/Platelet     Endocrine   IFG (impaired fasting glucose)    Rechecking labs today. Await results. Treat as needed.       Relevant Orders  Comprehensive metabolic panel   CBC with Differential/Platelet   Bayer DCA Hb A1c Waived     Other   High cholesterol    Under good control on current regimen. Continue current regimen. Continue to monitor. Call with any concerns. Refills given. Labs drawn today.       Relevant Medications   pravastatin (PRAVACHOL) 20 MG tablet   Other Relevant Orders   Comprehensive metabolic panel   Lipid Panel w/o Chol/HDL Ratio   CBC with Differential/Platelet   Vitamin D deficiency    Rechecking labs today. Await results. Treat as needed.       Relevant Orders   Comprehensive metabolic panel   CBC with Differential/Platelet   VITAMIN D 25 Hydroxy (Vit-D Deficiency, Fractures)   Other Visit Diagnoses     Dysfunction of left eustachian tube    -  Primary   Will treat with prednisone and strat Epley's manuver for vertigo. Call if not getting better or getting worse.    Weight loss       Likely due to topamax and stopping soda. Continue to monitor. Checking labs.  Call with any concerns.    Relevant Orders   TSH        Follow up plan: Return in about 6 months (around 12/07/2021) for physical.

## 2021-06-09 NOTE — Assessment & Plan Note (Signed)
Doing very well. Continue current regimen. Continue to monitor. Refills given today.

## 2021-06-10 LAB — COMPREHENSIVE METABOLIC PANEL
ALT: 12 IU/L (ref 0–32)
AST: 15 IU/L (ref 0–40)
Albumin/Globulin Ratio: 2.3 — ABNORMAL HIGH (ref 1.2–2.2)
Albumin: 4.5 g/dL (ref 3.8–4.8)
Alkaline Phosphatase: 63 IU/L (ref 44–121)
BUN/Creatinine Ratio: 11 (ref 9–23)
BUN: 9 mg/dL (ref 6–24)
Bilirubin Total: 0.3 mg/dL (ref 0.0–1.2)
CO2: 21 mmol/L (ref 20–29)
Calcium: 9.6 mg/dL (ref 8.7–10.2)
Chloride: 106 mmol/L (ref 96–106)
Creatinine, Ser: 0.84 mg/dL (ref 0.57–1.00)
Globulin, Total: 2 g/dL (ref 1.5–4.5)
Glucose: 94 mg/dL (ref 70–99)
Potassium: 4 mmol/L (ref 3.5–5.2)
Sodium: 142 mmol/L (ref 134–144)
Total Protein: 6.5 g/dL (ref 6.0–8.5)
eGFR: 85 mL/min/{1.73_m2} (ref >59)

## 2021-06-10 LAB — CBC WITH DIFFERENTIAL/PLATELET
Basophils Absolute: 0 10*3/uL (ref 0.0–0.2)
Basos: 1 %
EOS (ABSOLUTE): 0.1 10*3/uL (ref 0.0–0.4)
Eos: 1 %
Hematocrit: 38.9 % (ref 34.0–46.6)
Hemoglobin: 13 g/dL (ref 11.1–15.9)
Immature Grans (Abs): 0 10*3/uL (ref 0.0–0.1)
Immature Granulocytes: 0 %
Lymphocytes Absolute: 2.5 10*3/uL (ref 0.7–3.1)
Lymphs: 40 %
MCH: 29 pg (ref 26.6–33.0)
MCHC: 33.4 g/dL (ref 31.5–35.7)
MCV: 87 fL (ref 79–97)
Monocytes Absolute: 0.4 10*3/uL (ref 0.1–0.9)
Monocytes: 6 %
Neutrophils Absolute: 3.2 10*3/uL (ref 1.4–7.0)
Neutrophils: 52 %
Platelets: 334 10*3/uL (ref 150–450)
RBC: 4.48 x10E6/uL (ref 3.77–5.28)
RDW: 13.1 % (ref 11.7–15.4)
WBC: 6.2 10*3/uL (ref 3.4–10.8)

## 2021-06-10 LAB — LIPID PANEL W/O CHOL/HDL RATIO
Cholesterol, Total: 161 mg/dL (ref 100–199)
HDL: 66 mg/dL (ref >39)
LDL Chol Calc (NIH): 85 mg/dL (ref 0–99)
Triglycerides: 47 mg/dL (ref 0–149)
VLDL Cholesterol Cal: 10 mg/dL (ref 5–40)

## 2021-06-10 LAB — VITAMIN D 25 HYDROXY (VIT D DEFICIENCY, FRACTURES): Vit D, 25-Hydroxy: 43.1 ng/mL (ref 30.0–100.0)

## 2021-06-10 LAB — TSH: TSH: 1.78 u[IU]/mL (ref 0.450–4.500)

## 2021-06-23 ENCOUNTER — Ambulatory Visit: Payer: 59 | Admitting: Family Medicine

## 2021-12-07 ENCOUNTER — Encounter: Payer: 59 | Admitting: Family Medicine

## 2021-12-29 ENCOUNTER — Encounter: Payer: Self-pay | Admitting: Family Medicine

## 2021-12-29 ENCOUNTER — Ambulatory Visit (INDEPENDENT_AMBULATORY_CARE_PROVIDER_SITE_OTHER): Payer: 59 | Admitting: Family Medicine

## 2021-12-29 VITALS — BP 125/84 | HR 96 | Temp 98.0°F | Ht 63.0 in | Wt 141.2 lb

## 2021-12-29 DIAGNOSIS — K219 Gastro-esophageal reflux disease without esophagitis: Secondary | ICD-10-CM

## 2021-12-29 DIAGNOSIS — Z0000 Encounter for general adult medical examination without abnormal findings: Secondary | ICD-10-CM

## 2021-12-29 DIAGNOSIS — E7800 Pure hypercholesterolemia, unspecified: Secondary | ICD-10-CM | POA: Diagnosis not present

## 2021-12-29 DIAGNOSIS — E559 Vitamin D deficiency, unspecified: Secondary | ICD-10-CM | POA: Diagnosis not present

## 2021-12-29 DIAGNOSIS — J4520 Mild intermittent asthma, uncomplicated: Secondary | ICD-10-CM | POA: Diagnosis not present

## 2021-12-29 DIAGNOSIS — R7301 Impaired fasting glucose: Secondary | ICD-10-CM | POA: Diagnosis not present

## 2021-12-29 DIAGNOSIS — G43009 Migraine without aura, not intractable, without status migrainosus: Secondary | ICD-10-CM | POA: Diagnosis not present

## 2021-12-29 LAB — URINALYSIS, ROUTINE W REFLEX MICROSCOPIC
Bilirubin, UA: NEGATIVE
Glucose, UA: NEGATIVE
Ketones, UA: NEGATIVE
Nitrite, UA: NEGATIVE
Protein,UA: NEGATIVE
RBC, UA: NEGATIVE
Specific Gravity, UA: 1.015 (ref 1.005–1.030)
Urobilinogen, Ur: 0.2 mg/dL (ref 0.2–1.0)
pH, UA: 7 (ref 5.0–7.5)

## 2021-12-29 LAB — MICROSCOPIC EXAMINATION: RBC, Urine: NONE SEEN /hpf (ref 0–2)

## 2021-12-29 LAB — BAYER DCA HB A1C WAIVED: HB A1C (BAYER DCA - WAIVED): 5.9 % — ABNORMAL HIGH (ref 4.8–5.6)

## 2021-12-29 MED ORDER — IBUPROFEN 800 MG PO TABS
800.0000 mg | ORAL_TABLET | Freq: Three times a day (TID) | ORAL | 6 refills | Status: DC | PRN
Start: 2021-12-29 — End: 2023-02-07

## 2021-12-29 MED ORDER — ALBUTEROL SULFATE HFA 108 (90 BASE) MCG/ACT IN AERS: INHALATION_SPRAY | RESPIRATORY_TRACT | 6 refills | Status: DC

## 2021-12-29 MED ORDER — PANTOPRAZOLE SODIUM 40 MG PO TBEC: 40.0000 mg | DELAYED_RELEASE_TABLET | Freq: Two times a day (BID) | ORAL | 6 refills | Status: DC

## 2021-12-29 MED ORDER — PRAVASTATIN SODIUM 20 MG PO TABS
ORAL_TABLET | ORAL | 6 refills | Status: DC
Start: 2021-12-29 — End: 2022-06-29

## 2021-12-29 MED ORDER — TOPIRAMATE 25 MG PO TABS: 25.0000 mg | ORAL_TABLET | Freq: Two times a day (BID) | ORAL | 6 refills | Status: DC

## 2021-12-29 MED ORDER — NORTRIPTYLINE HCL 10 MG PO CAPS: 10.0000 mg | ORAL_CAPSULE | Freq: Every day | ORAL | 6 refills | Status: DC

## 2021-12-29 NOTE — Assessment & Plan Note (Signed)
Rechecking labs today. Await results. Treat as needed.  °

## 2021-12-29 NOTE — Assessment & Plan Note (Signed)
Acting up a bit. Will work on diet. Continue to monitor. Checking labs today. Refills given.

## 2021-12-29 NOTE — Assessment & Plan Note (Signed)
Under good control on current regimen. Continue current regimen. Continue to monitor. Call with any concerns. Refills given.   

## 2021-12-29 NOTE — Assessment & Plan Note (Signed)
Under good control on current regimen. Continue current regimen. Continue to monitor. Call with any concerns. Refills given. Labs drawn today.   

## 2021-12-29 NOTE — Progress Notes (Signed)
BP 125/84   Pulse 96   Temp 98 F (36.7 C)   Ht _0  (1.6 m)   Wt 141 lb 3.2 oz (64 kg)   SpO2 100%   BMI 25.01 kg/m    Subjective:    Patient ID: Judy Munoz, female    DOB: [birth year], 51 y.o.   MRN: [id number]  HPI: Judy Munoz is a 51 y.o. female presenting on 12/29/2021 for comprehensive medical examination. Current medical complaints include:  Impaired Fasting Glucose HbA1C:  Lab Results  Component Value Date   HGBA1C 5.9 (H) 06/09/2021   Duration of elevated blood sugar: chronic Polydipsia: no Polyuria: no Weight change: no Visual disturbance: no Glucose Monitoring: no    Accucheck frequency: Not Checking Diabetic Education: Not Completed Family history of diabetes: yes  GERD GERD control status:  fluctuating Satisfied with current treatment? unsure Heartburn frequency: occasionally Medication side effects: no  Medication compliance: excellent Dysphagia: no Odynophagia:  no Hematemesis: no Blood in stool: no EGD: no  Migraines have been doing ok. Working on figuring out triggers  HYPERLIPIDEMIA Hyperlipidemia status: excellent compliance Satisfied with current treatment?  yes Side effects:  no Medication compliance: excellent compliance Past cholesterol meds: pravastatin Supplements: none Aspirin:  no The 10-year ASCVD risk score (Arnett DK, et al., 2019) is: 0.7%   Values used to calculate the score:     Age: 19 years     Sex: Female     Is Non-Hispanic African American: No     Diabetic: No     Tobacco smoker: No     Systolic Blood Pressure: 892 mmHg     Is BP treated: No     HDL Cholesterol: 66 mg/dL     Total Cholesterol: 161 mg/dL Chest pain:  no Coronary artery disease:  no  She currently lives with: husband and daughter Menopausal Symptoms: yes  Depression Screen done today and results listed below:     06/09/2021   10:43 AM 04/15/2021   11:02 AM 07/14/2019    8:39 AM 06/27/2018    9:48 AM 06/27/2018    9:47 AM   Depression screen PHQ 2/9  Decreased Interest 0 0 0 0 0  Down, Depressed, Hopeless 0 0 0 0 0  PHQ - 2 Score 0 0 0 0 0  Altered sleeping 0  0 0 0  Tired, decreased energy 0  0 0 0  Change in appetite 0  0 0 0  Feeling bad or failure about yourself  0  0 0 0  Trouble concentrating 0  0 0 0  Moving slowly or fidgety/restless 0  0 0 0  Suicidal thoughts 0  0 0 0  PHQ-9 Score 0  0 0 0  Difficult doing work/chores   Not difficult at all Not difficult at all     Past Medical History:  Past Medical History:  Diagnosis Date   Allergy Seasonal   Anemia    H/O WITH PREGNANCY   Asthma    Biliary dyskinesia    Elevated glucose    GERD (gastroesophageal reflux disease)    Headache    MIGRAINES RARE   Heart murmur    WAS TOLD THIS ONCE YEARS AGO-HAS NEVER BEEN TOLD SINCE   High cholesterol    History of uterine fibroid    IFG (impaired fasting glucose)    PONV (postoperative nausea and vomiting)    HAPPENED WITH HER 1ST EGD- HER 2ND EGD SHE WAS GIVEN ANTI-EMETIC IV AND  HAD NO N/V   Trapezius muscle strain    Vitamin D deficiency     Surgical History:  Past Surgical History:  Procedure Laterality Date   CHOLECYSTECTOMY N/A 07/18/2016   Procedure: LAPAROSCOPIC CHOLECYSTECTOMY;  Surgeon: Jules Husbands, MD;  Location: [facility];  Service: General;  Laterality: N/A;   CHOLECYSTECTOMY, LAPAROSCOPIC  07/18/2016   ESOPHAGOGASTRODUODENOSCOPY     ESOPHAGOGASTRODUODENOSCOPY (EGD) WITH PROPOFOL N/A 07/26/2015   Procedure: ESOPHAGOGASTRODUODENOSCOPY (EGD) WITH PROPOFOL;  Surgeon: Lucilla Lame, MD;  Location: [place];  Service: Endoscopy;  Laterality: N/A;    Medications:  Current Outpatient Medications on File Prior to Visit  Medication Sig   cetirizine (ZYRTEC) 10 MG tablet Take 10 mg by mouth daily.   Cholecalciferol (VITAMIN D-400 PO) Take 800 mg by mouth.    fluticasone (FLONASE) 50 MCG/ACT nasal spray Place 2 sprays into both nostrils daily.   levonorgestrel (MIRENA) 20  MCG/24HR IUD by Intrauterine route once.    ondansetron (ZOFRAN) 8 MG tablet Take 1 tablet (8 mg total) by mouth every 8 (eight) hours as needed for nausea or vomiting.   No current facility-administered medications on file prior to visit.    Allergies:  Allergies  Allergen Reactions   Sulfa Antibiotics Hives    Social History:  Social History   Socioeconomic History   Marital status: Married    Spouse name: Not on file   Number of children: Not on file   Years of education: Not on file   Highest education level: Not on file  Occupational History   Not on file  Tobacco Use   Smoking status: Never   Smokeless tobacco: Never  Vaping Use   Vaping Use: Never used  Substance and Sexual Activity   Alcohol use: No   Drug use: No   Sexual activity: Yes    Birth control/protection: I.U.D.  Other Topics Concern   Not on file  Social History Narrative   Not on file   Social Determinants of Health   Financial Resource Strain: Not on file  Food Insecurity: Not on file  Transportation Needs: Not on file  Physical Activity: Sufficiently Active (03/13/2017)   Exercise Vital Sign    Days of Exercise per Week: 7 days    Minutes of Exercise per Session: 60 min  Stress: No Stress Concern Present (03/13/2017)   Sandwich    Feeling of Stress : Only a little  Social Connections: Unknown (03/13/2017)   Social Connection and Isolation Panel [NHANES]    Frequency of Communication with Friends and Family: Patient refused    Frequency of Social Gatherings with Friends and Family: Patient refused    Attends Religious Services: Patient refused    Active Member of Clubs or Organizations: Patient refused    Attends Archivist Meetings: Patient refused    Marital Status: Patient refused  Intimate Partner Violence: Unknown (03/13/2017)   Humiliation, Afraid, Rape, and Kick questionnaire    Fear of Current or  Ex-Partner: Patient refused    Emotionally Abused: Patient refused    Physically Abused: Patient refused    Sexually Abused: Patient refused   Social History   Tobacco Use  Smoking Status Never  Smokeless Tobacco Never   Social History   Substance and Sexual Activity  Alcohol Use No    Family History:  Family History  Problem Relation Age of Onset   Thyroid disease Mother    Diabetes Father  Heart disease Father    Hypertension Father    Hyperlipidemia Father    Lung cancer Maternal Uncle    Cancer Maternal Grandmother        uterine   Thyroid disease Maternal Grandmother    Esophageal cancer Maternal Grandmother    Breast cancer Neg Hx     Past medical history, surgical history, medications, allergies, family history and social history reviewed with patient today and changes made to appropriate areas of the chart.   Review of Systems  Constitutional: Negative.   HENT: Negative.    Eyes: Negative.   Respiratory:  Positive for cough and wheezing. Negative for hemoptysis, sputum production and shortness of breath.   Cardiovascular: Negative.   Gastrointestinal:  Positive for diarrhea and heartburn. Negative for abdominal pain, blood in stool, constipation, melena, nausea and vomiting.  Genitourinary: Negative.   Musculoskeletal: Negative.   Skin: Negative.   Neurological: Negative.   Endo/Heme/Allergies:  Positive for environmental allergies. Negative for polydipsia. Does not bruise/bleed easily.  Psychiatric/Behavioral: Negative.     All other ROS negative except what is listed above and in the HPI.      Objective:    BP 125/84   Pulse 96   Temp 98 F (36.7 C)   Ht $R'5\' 3"'eu$  (1.6 m)   Wt 141 lb 3.2 oz (64 kg)   SpO2 100%   BMI 25.01 kg/m   Wt Readings from Last 3 Encounters:  12/29/21 141 lb 3.2 oz (64 kg)  06/09/21 141 lb (64 kg)  04/15/21 145 lb 12.8 oz (66.1 kg)    Physical Exam Vitals and nursing note reviewed.  Constitutional:      General: She  is not in acute distress.    Appearance: Normal appearance. She is not ill-appearing, toxic-appearing or diaphoretic.  HENT:     Head: Normocephalic and atraumatic.     Right Ear: Tympanic membrane, ear canal and external ear normal. There is no impacted cerumen.     Left Ear: Tympanic membrane, ear canal and external ear normal. There is no impacted cerumen.     Nose: Nose normal. No congestion or rhinorrhea.     Mouth/Throat:     Mouth: Mucous membranes are moist.     Pharynx: Oropharynx is clear. No oropharyngeal exudate or posterior oropharyngeal erythema.  Eyes:     General: No scleral icterus.       Right eye: No discharge.        Left eye: No discharge.     Extraocular Movements: Extraocular movements intact.     Conjunctiva/sclera: Conjunctivae normal.     Pupils: Pupils are equal, round, and reactive to light.  Neck:     Vascular: No carotid bruit.  Cardiovascular:     Rate and Rhythm: Normal rate and regular rhythm.     Pulses: Normal pulses.     Heart sounds: No murmur heard.    No friction rub. No gallop.  Pulmonary:     Effort: Pulmonary effort is normal. No respiratory distress.     Breath sounds: Normal breath sounds. No stridor. No wheezing, rhonchi or rales.  Chest:     Chest wall: No tenderness.  Breasts:    Right: Normal.     Left: Normal.  Abdominal:     General: Abdomen is flat. Bowel sounds are normal. There is no distension.     Palpations: Abdomen is soft. There is no mass.     Tenderness: There is no abdominal tenderness. There is no  right CVA tenderness, left CVA tenderness, guarding or rebound.     Hernia: No hernia is present.  Genitourinary:    Comments: Pelvic exams deferred with shared decision making Musculoskeletal:        General: No swelling, tenderness, deformity or signs of injury.     Cervical back: Normal range of motion and neck supple. No rigidity. No muscular tenderness.     Right lower leg: No edema.     Left lower leg: No edema.   Lymphadenopathy:     Cervical: No cervical adenopathy.  Skin:    General: Skin is warm and dry.     Capillary Refill: Capillary refill takes less than 2 seconds.     Coloration: Skin is not jaundiced or pale.     Findings: No bruising, erythema, lesion or rash.  Neurological:     General: No focal deficit present.     Mental Status: She is alert and oriented to person, place, and time. Mental status is at baseline.     Cranial Nerves: No cranial nerve deficit.     Sensory: No sensory deficit.     Motor: No weakness.     Coordination: Coordination normal.     Gait: Gait normal.     Deep Tendon Reflexes: Reflexes normal.  Psychiatric:        Mood and Affect: Mood normal.        Behavior: Behavior normal.        Thought Content: Thought content normal.        Judgment: Judgment normal.     Results for orders placed or performed in visit on 06/09/21  Comprehensive metabolic panel  Result Value Ref Range   Glucose 94 70 - 99 mg/dL   BUN 9 6 - 24 mg/dL   Creatinine, Ser 0.84 0.57 - 1.00 mg/dL   eGFR 85 >59 mL/min/1.73   BUN/Creatinine Ratio 11 9 - 23   Sodium 142 134 - 144 mmol/L   Potassium 4.0 3.5 - 5.2 mmol/L   Chloride 106 96 - 106 mmol/L   CO2 21 20 - 29 mmol/L   Calcium 9.6 8.7 - 10.2 mg/dL   Total Protein 6.5 6.0 - 8.5 g/dL   Albumin 4.5 3.8 - 4.8 g/dL   Globulin, Total 2.0 1.5 - 4.5 g/dL   Albumin/Globulin Ratio 2.3 (H) 1.2 - 2.2   Bilirubin Total 0.3 0.0 - 1.2 mg/dL   Alkaline Phosphatase 63 44 - 121 IU/L   AST 15 0 - 40 IU/L   ALT 12 0 - 32 IU/L  Lipid Panel w/o Chol/HDL Ratio  Result Value Ref Range   Cholesterol, Total 161 100 - 199 mg/dL   Triglycerides 47 0 - 149 mg/dL   HDL 66 >39 mg/dL   VLDL Cholesterol Cal 10 5 - 40 mg/dL   LDL Chol Calc (NIH) 85 0 - 99 mg/dL  CBC with Differential/Platelet  Result Value Ref Range   WBC 6.2 3.4 - 10.8 x10E3/uL   RBC 4.48 3.77 - 5.28 x10E6/uL   Hemoglobin 13.0 11.1 - 15.9 g/dL   Hematocrit 38.9 34.0 - 46.6 %    MCV 87 79 - 97 fL   MCH 29.0 26.6 - 33.0 pg   MCHC 33.4 31.5 - 35.7 g/dL   RDW 13.1 11.7 - 15.4 %   Platelets 334 150 - 450 x10E3/uL   Neutrophils 52 Not Estab. %   Lymphs 40 Not Estab. %   Monocytes 6 Not Estab. %   Eos 1  Not Estab. %   Basos 1 Not Estab. %   Neutrophils Absolute 3.2 1.4 - 7.0 x10E3/uL   Lymphocytes Absolute 2.5 0.7 - 3.1 x10E3/uL   Monocytes Absolute 0.4 0.1 - 0.9 x10E3/uL   EOS (ABSOLUTE) 0.1 0.0 - 0.4 x10E3/uL   Basophils Absolute 0.0 0.0 - 0.2 x10E3/uL   Immature Granulocytes 0 Not Estab. %   Immature Grans (Abs) 0.0 0.0 - 0.1 x10E3/uL  VITAMIN D 25 Hydroxy (Vit-D Deficiency, Fractures)  Result Value Ref Range   Vit D, 25-Hydroxy 43.1 30.0 - 100.0 ng/mL  Bayer DCA Hb A1c Waived  Result Value Ref Range   HB A1C (BAYER DCA - WAIVED) 5.9 (H) 4.8 - 5.6 %  TSH  Result Value Ref Range   TSH 1.780 0.450 - 4.500 uIU/mL      Assessment & Plan:   Problem List Items Addressed This Visit       Cardiovascular and Mediastinum   Migraine without aura and without status migrainosus, not intractable    Under good control on current regimen. Continue current regimen. Continue to monitor. Call with any concerns. Refills given.        Relevant Medications   pravastatin (PRAVACHOL) 20 MG tablet   nortriptyline (PAMELOR) 10 MG capsule   topiramate (TOPAMAX) 25 MG tablet   ibuprofen (ADVIL) 800 MG tablet     Respiratory   Reactive airway disease    Under good control on current regimen. Continue current regimen. Continue to monitor. Call with any concerns. Refills given.        Relevant Orders   Comprehensive metabolic panel     Digestive   GERD (gastroesophageal reflux disease)    Acting up a bit. Will work on diet. Continue to monitor. Checking labs today. Refills given.       Relevant Medications   pantoprazole (PROTONIX) 40 MG tablet   Other Relevant Orders   CBC with Differential/Platelet   Comprehensive metabolic panel     Endocrine   IFG  (impaired fasting glucose)    Rechecking labs today. Await results. Treat as needed.       Relevant Orders   Comprehensive metabolic panel   Bayer DCA Hb A1c Waived     Other   High cholesterol    Under good control on current regimen. Continue current regimen. Continue to monitor. Call with any concerns. Refills given. Labs drawn today.       Relevant Medications   pravastatin (PRAVACHOL) 20 MG tablet   Other Relevant Orders   Comprehensive metabolic panel   Lipid Panel w/o Chol/HDL Ratio   Vitamin D deficiency    Rechecking labs today. Await results. Treat as needed.       Relevant Orders   Comprehensive metabolic panel   VITAMIN D 25 Hydroxy (Vit-D Deficiency, Fractures)   Other Visit Diagnoses     Routine general medical examination at a health care facility    -  Primary   Vaccines up to date/declined. Screening labs checked today. Pap, mammo and colonoscopy up to date. Continue diet and exercise. Call with any concerns.    Relevant Orders   CBC with Differential/Platelet   Comprehensive metabolic panel   Lipid Panel w/o Chol/HDL Ratio   Urinalysis, Routine w reflex microscopic   TSH   Bayer DCA Hb A1c Waived   VITAMIN D 25 Hydroxy (Vit-D Deficiency, Fractures)        Follow up plan: Return in about 6 months (around 06/29/2022).   LABORATORY TESTING:  -  Pap smear: up to date  IMMUNIZATIONS:   - Tdap: Tetanus vaccination status reviewed: last tetanus booster within 10 years. - Influenza: Postponed to flu season - Pneumovax: Not applicable - Prevnar: Not applicable - COVID: Refused - HPV: Not applicable - Shingrix vaccine: Not applicable  SCREENING: -Mammogram: Up to date  - Colonoscopy: Up to date   PATIENT COUNSELING:   Advised to take 1 mg of folate supplement per day if capable of pregnancy.   Sexuality: Discussed sexually transmitted diseases, partner selection, use of condoms, avoidance of unintended pregnancy  and contraceptive alternatives.    Advised to avoid cigarette smoking.  I discussed with the patient that most people either abstain from alcohol or drink within safe limits (<=14/week and <=4 drinks/occasion for males, <=7/weeks and <= 3 drinks/occasion for females) and that the risk for alcohol disorders and other health effects rises proportionally with the number of drinks per week and how often a drinker exceeds daily limits.  Discussed cessation/primary prevention of drug use and availability of treatment for abuse.   Diet: Encouraged to adjust caloric intake to maintain  or achieve ideal body weight, to reduce intake of dietary saturated fat and total fat, to limit sodium intake by avoiding high sodium foods and not adding table salt, and to maintain adequate dietary potassium and calcium preferably from fresh fruits, vegetables, and low-fat dairy products.    stressed the importance of regular exercise  Injury prevention: Discussed safety belts, safety helmets, smoke detector, smoking near bedding or upholstery.   Dental health: Discussed importance of regular tooth brushing, flossing, and dental visits.    NEXT PREVENTATIVE PHYSICAL DUE IN 1 YEAR. Return in about 6 months (around 06/29/2022).

## 2021-12-30 LAB — LIPID PANEL W/O CHOL/HDL RATIO
Cholesterol, Total: 167 mg/dL (ref 100–199)
HDL: 73 mg/dL (ref >39)
LDL Chol Calc (NIH): 82 mg/dL (ref 0–99)
Triglycerides: 61 mg/dL (ref 0–149)
VLDL Cholesterol Cal: 12 mg/dL (ref 5–40)

## 2021-12-30 LAB — COMPREHENSIVE METABOLIC PANEL
ALT: 14 IU/L (ref 0–32)
AST: 16 IU/L (ref 0–40)
Albumin/Globulin Ratio: 2.5 — ABNORMAL HIGH (ref 1.2–2.2)
Albumin: 4.9 g/dL (ref 3.9–4.9)
Alkaline Phosphatase: 64 IU/L (ref 44–121)
BUN/Creatinine Ratio: 15 (ref 9–23)
BUN: 14 mg/dL (ref 6–24)
Bilirubin Total: 0.4 mg/dL (ref 0.0–1.2)
CO2: 21 mmol/L (ref 20–29)
Calcium: 9.7 mg/dL (ref 8.7–10.2)
Chloride: 106 mmol/L (ref 96–106)
Creatinine, Ser: 0.94 mg/dL (ref 0.57–1.00)
Globulin, Total: 2 g/dL (ref 1.5–4.5)
Glucose: 105 mg/dL — ABNORMAL HIGH (ref 70–99)
Potassium: 3.9 mmol/L (ref 3.5–5.2)
Sodium: 141 mmol/L (ref 134–144)
Total Protein: 6.9 g/dL (ref 6.0–8.5)
eGFR: 74 mL/min/{1.73_m2} (ref >59)

## 2021-12-30 LAB — CBC WITH DIFFERENTIAL/PLATELET
Basophils Absolute: 0 10*3/uL (ref 0.0–0.2)
Basos: 1 %
EOS (ABSOLUTE): 0.1 10*3/uL (ref 0.0–0.4)
Eos: 1 %
Hematocrit: 39.9 % (ref 34.0–46.6)
Hemoglobin: 13.1 g/dL (ref 11.1–15.9)
Immature Grans (Abs): 0 10*3/uL (ref 0.0–0.1)
Immature Granulocytes: 0 %
Lymphocytes Absolute: 2.3 10*3/uL (ref 0.7–3.1)
Lymphs: 40 %
MCH: 28.8 pg (ref 26.6–33.0)
MCHC: 32.8 g/dL (ref 31.5–35.7)
MCV: 88 fL (ref 79–97)
Monocytes Absolute: 0.3 10*3/uL (ref 0.1–0.9)
Monocytes: 6 %
Neutrophils Absolute: 3 10*3/uL (ref 1.4–7.0)
Neutrophils: 52 %
Platelets: 317 10*3/uL (ref 150–450)
RBC: 4.55 x10E6/uL (ref 3.77–5.28)
RDW: 13 % (ref 11.7–15.4)
WBC: 5.8 10*3/uL (ref 3.4–10.8)

## 2021-12-30 LAB — VITAMIN D 25 HYDROXY (VIT D DEFICIENCY, FRACTURES): Vit D, 25-Hydroxy: 36.1 ng/mL (ref 30.0–100.0)

## 2021-12-30 LAB — TSH: TSH: 1.83 u[IU]/mL (ref 0.450–4.500)

## 2022-02-02 ENCOUNTER — Ambulatory Visit (INDEPENDENT_AMBULATORY_CARE_PROVIDER_SITE_OTHER): Payer: 59

## 2022-02-02 DIAGNOSIS — Z23 Encounter for immunization: Secondary | ICD-10-CM

## 2022-04-03 ENCOUNTER — Encounter: Payer: Self-pay | Admitting: Family Medicine

## 2022-04-06 ENCOUNTER — Other Ambulatory Visit: Payer: Self-pay | Admitting: Nurse Practitioner

## 2022-04-06 DIAGNOSIS — Z1231 Encounter for screening mammogram for malignant neoplasm of breast: Secondary | ICD-10-CM

## 2022-04-07 ENCOUNTER — Ambulatory Visit: Payer: 59 | Admitting: Family Medicine

## 2022-04-07 ENCOUNTER — Encounter: Payer: Self-pay | Admitting: Family Medicine

## 2022-04-07 VITALS — BP 118/72 | HR 72 | Temp 97.6°F | Wt 139.8 lb

## 2022-04-07 DIAGNOSIS — K219 Gastro-esophageal reflux disease without esophagitis: Secondary | ICD-10-CM | POA: Diagnosis not present

## 2022-04-07 DIAGNOSIS — R6889 Other general symptoms and signs: Secondary | ICD-10-CM

## 2022-04-07 DIAGNOSIS — R1319 Other dysphagia: Secondary | ICD-10-CM

## 2022-04-07 MED ORDER — ESOMEPRAZOLE MAGNESIUM 40 MG PO CPDR: 80.0000 mg | DELAYED_RELEASE_CAPSULE | Freq: Every day | ORAL | 3 refills | Status: DC

## 2022-04-07 NOTE — Progress Notes (Signed)
BP 118/72 (BP Location: Left Arm, Cuff Size: Normal)   Pulse 72   Temp 97.6 F (36.4 C) (Oral)   Wt 139 lb 12.8 oz (63.4 kg)   SpO2 98%   BMI 24.76 kg/m    Subjective:    Patient ID: Judy Munoz, female    DOB: November 09, 1970, 51 y.o.   MRN: [id number]  HPI: Judy Munoz is a 51 y.o. female  Chief Complaint  Patient presents with   Gastroesophageal Reflux    Pt states she has been having issues with acid reflux lately. States she feels like her pantoprazole is not working anymore. Also states she has been having trouble swallowing.    DYSPHAGIA Duration: couple of weeks Description of symptom: sore and food catching Onset: Several seconds after swallowing Location of dysphagia: throat Dysphagia to solids only: yes Dysphagia to solids & liquids: no  Frequency: everytime she eats   Progressively getting worse: staying the same Alleviatiating factors: nothing Provoking factors: certain foods Status: stable EGD: no Weight loss: no Sensation of lump in throat: yes Heartburn: yes Odynophagia: no Nausea: no Vomiting: no Drooling/nasal regurgitation/food spillage: no Coughing/choking/dysphonia: no Dysarthria: no Hematemesis: no Regurgitation of undigested food/halitosis: tes Chest pain: no   Relevant past medical, surgical, family and social history reviewed and updated as indicated. Interim medical history since our last visit reviewed. Allergies and medications reviewed and updated.  Review of Systems  Constitutional: Negative.   HENT:  Positive for ear pain and sore throat. Negative for congestion, dental problem, drooling, ear discharge, facial swelling, hearing loss, mouth sores, nosebleeds, postnasal drip, rhinorrhea, sinus pressure, sinus pain, sneezing, tinnitus, trouble swallowing and voice change.   Respiratory: Negative.    Cardiovascular: Negative.   Gastrointestinal:  Positive for abdominal pain. Negative for abdominal distention, anal bleeding,  blood in stool, constipation, diarrhea, nausea, rectal pain and vomiting.  Musculoskeletal: Negative.   Skin: Negative.   Psychiatric/Behavioral: Negative.      Per HPI unless specifically indicated above     Objective:    BP 118/72 (BP Location: Left Arm, Cuff Size: Normal)   Pulse 72   Temp 97.6 F (36.4 C) (Oral)   Wt 139 lb 12.8 oz (63.4 kg)   SpO2 98%   BMI 24.76 kg/m   Wt Readings from Last 3 Encounters:  04/07/22 139 lb 12.8 oz (63.4 kg)  12/29/21 141 lb 3.2 oz (64 kg)  06/09/21 141 lb (64 kg)    Physical Exam Vitals and nursing note reviewed.  Constitutional:      General: She is not in acute distress.    Appearance: Normal appearance. She is not ill-appearing, toxic-appearing or diaphoretic.  HENT:     Head: Normocephalic and atraumatic.     Right Ear: Tympanic membrane, ear canal and external ear normal.     Left Ear: Tympanic membrane, ear canal and external ear normal.     Ears:     Comments: Wax in ear- not impacted    Nose: Nose normal.     Mouth/Throat:     Mouth: Mucous membranes are moist.     Pharynx: Oropharynx is clear.     Comments: Lymph appearing tissue at the back of her throat and swollen tonsil on the L side Eyes:     General: No scleral icterus.       Right eye: No discharge.        Left eye: No discharge.     Extraocular Movements: Extraocular movements intact.  Conjunctiva/sclera: Conjunctivae normal.     Pupils: Pupils are equal, round, and reactive to light.  Cardiovascular:     Rate and Rhythm: Normal rate and regular rhythm.     Pulses: Normal pulses.     Heart sounds: Normal heart sounds. No murmur heard.    No friction rub. No gallop.  Pulmonary:     Effort: Pulmonary effort is normal. No respiratory distress.     Breath sounds: Normal breath sounds. No stridor. No wheezing, rhonchi or rales.  Chest:     Chest wall: No tenderness.  Musculoskeletal:        General: Normal range of motion.     Cervical back: Normal range  of motion and neck supple.  Skin:    General: Skin is warm and dry.     Capillary Refill: Capillary refill takes less than 2 seconds.     Coloration: Skin is not jaundiced or pale.     Findings: No bruising, erythema, lesion or rash.  Neurological:     General: No focal deficit present.     Mental Status: She is alert and oriented to person, place, and time. Mental status is at baseline.  Psychiatric:        Mood and Affect: Mood normal.        Behavior: Behavior normal.        Thought Content: Thought content normal.        Judgment: Judgment normal.     Results for orders placed or performed in visit on 12/29/21  Microscopic Examination   Urine  Result Value Ref Range   WBC, UA 0-5 0 - 5 /hpf   RBC, Urine None seen 0 - 2 /hpf   Epithelial Cells (non renal) 0-10 0 - 10 /hpf   Bacteria, UA Few (A) None seen/Few  CBC with Differential/Platelet  Result Value Ref Range   WBC 5.8 3.4 - 10.8 x10E3/uL   RBC 4.55 3.77 - 5.28 x10E6/uL   Hemoglobin 13.1 11.1 - 15.9 g/dL   Hematocrit 39.9 34.0 - 46.6 %   MCV 88 79 - 97 fL   MCH 28.8 26.6 - 33.0 pg   MCHC 32.8 31.5 - 35.7 g/dL   RDW 13.0 11.7 - 15.4 %   Platelets 317 150 - 450 x10E3/uL   Neutrophils 52 Not Estab. %   Lymphs 40 Not Estab. %   Monocytes 6 Not Estab. %   Eos 1 Not Estab. %   Basos 1 Not Estab. %   Neutrophils Absolute 3.0 1.4 - 7.0 x10E3/uL   Lymphocytes Absolute 2.3 0.7 - 3.1 x10E3/uL   Monocytes Absolute 0.3 0.1 - 0.9 x10E3/uL   EOS (ABSOLUTE) 0.1 0.0 - 0.4 x10E3/uL   Basophils Absolute 0.0 0.0 - 0.2 x10E3/uL   Immature Granulocytes 0 Not Estab. %   Immature Grans (Abs) 0.0 0.0 - 0.1 x10E3/uL  Comprehensive metabolic panel  Result Value Ref Range   Glucose 105 (H) 70 - 99 mg/dL   BUN 14 6 - 24 mg/dL   Creatinine, Ser 0.94 0.57 - 1.00 mg/dL   eGFR 74 >59 mL/min/1.73   BUN/Creatinine Ratio 15 9 - 23   Sodium 141 134 - 144 mmol/L   Potassium 3.9 3.5 - 5.2 mmol/L   Chloride 106 96 - 106 mmol/L   CO2 21 20 -  29 mmol/L   Calcium 9.7 8.7 - 10.2 mg/dL   Total Protein 6.9 6.0 - 8.5 g/dL   Albumin 4.9 3.9 - 4.9 g/dL  Globulin, Total 2.0 1.5 - 4.5 g/dL   Albumin/Globulin Ratio 2.5 (H) 1.2 - 2.2   Bilirubin Total 0.4 0.0 - 1.2 mg/dL   Alkaline Phosphatase 64 44 - 121 IU/L   AST 16 0 - 40 IU/L   ALT 14 0 - 32 IU/L  Lipid Panel w/o Chol/HDL Ratio  Result Value Ref Range   Cholesterol, Total 167 100 - 199 mg/dL   Triglycerides 61 0 - 149 mg/dL   HDL 73 >39 mg/dL   VLDL Cholesterol Cal 12 5 - 40 mg/dL   LDL Chol Calc (NIH) 82 0 - 99 mg/dL  Urinalysis, Routine w reflex microscopic  Result Value Ref Range   Specific Gravity, UA 1.015 1.005 - 1.030   pH, UA 7.0 5.0 - 7.5   Color, UA Yellow Yellow   Appearance Ur Clear Clear   Leukocytes,UA 1+ (A) Negative   Protein,UA Negative Negative/Trace   Glucose, UA Negative Negative   Ketones, UA Negative Negative   RBC, UA Negative Negative   Bilirubin, UA Negative Negative   Urobilinogen, Ur 0.2 0.2 - 1.0 mg/dL   Nitrite, UA Negative Negative   Microscopic Examination See below:   TSH  Result Value Ref Range   TSH 1.830 0.450 - 4.500 uIU/mL  Bayer DCA Hb A1c Waived  Result Value Ref Range   HB A1C (BAYER DCA - WAIVED) 5.9 (H) 4.8 - 5.6 %  VITAMIN D 25 Hydroxy (Vit-D Deficiency, Fractures)  Result Value Ref Range   Vit D, 25-Hydroxy 36.1 30.0 - 100.0 ng/mL      Assessment & Plan:   Problem List Items Addressed This Visit       Digestive   GERD (gastroesophageal reflux disease) - Primary    Not under good control. Will change to nexium and recheck 1 month. Call with any concerns or if getting worse.       Relevant Medications   esomeprazole (NEXIUM) 40 MG capsule   Other Relevant Orders   Ambulatory referral to Gastroenterology   Other Visit Diagnoses     Esophageal dysphagia       Will change PPI and refer to GI. Recheck 1 month.   Relevant Orders   Ambulatory referral to Gastroenterology   Abnormal findings on examination of  skull and head       ?residual adeniod swelling and swollen tonsil on exam due to irritation- if not better next visit, will refer to ENT.        Follow up plan: Return in about 4 weeks (around 05/05/2022).

## 2022-04-07 NOTE — Assessment & Plan Note (Signed)
Not under good control. Will change to nexium and recheck 1 month. Call with any concerns or if getting worse.

## 2022-04-25 ENCOUNTER — Encounter: Payer: Self-pay | Admitting: Family Medicine

## 2022-04-26 ENCOUNTER — Other Ambulatory Visit: Payer: Self-pay | Admitting: Family Medicine

## 2022-04-26 DIAGNOSIS — R6889 Other general symptoms and signs: Secondary | ICD-10-CM

## 2022-05-04 ENCOUNTER — Ambulatory Visit: Payer: 59 | Admitting: Family Medicine

## 2022-05-04 ENCOUNTER — Encounter: Payer: Self-pay | Admitting: Gastroenterology

## 2022-05-04 ENCOUNTER — Other Ambulatory Visit: Payer: Self-pay

## 2022-05-04 ENCOUNTER — Telehealth: Payer: 59 | Admitting: Gastroenterology

## 2022-05-04 ENCOUNTER — Encounter: Payer: Self-pay | Admitting: Family Medicine

## 2022-05-04 VITALS — BP 110/74 | HR 76 | Temp 98.4°F | Ht 63.0 in | Wt 138.5 lb

## 2022-05-04 DIAGNOSIS — Z1211 Encounter for screening for malignant neoplasm of colon: Secondary | ICD-10-CM

## 2022-05-04 DIAGNOSIS — R6889 Other general symptoms and signs: Secondary | ICD-10-CM

## 2022-05-04 DIAGNOSIS — K219 Gastro-esophageal reflux disease without esophagitis: Secondary | ICD-10-CM

## 2022-05-04 DIAGNOSIS — K31A0 Gastric intestinal metaplasia, unspecified: Secondary | ICD-10-CM

## 2022-05-04 MED ORDER — NA SULFATE-K SULFATE-MG SULF 17.5-3.13-1.6 GM/177ML PO SOLN: 354.0000 mL | Freq: Once | ORAL | 0 refills | Status: AC

## 2022-05-04 NOTE — Assessment & Plan Note (Signed)
Improved. Seeing GI today. Continue to monitor. Call with any concerns.

## 2022-05-04 NOTE — Progress Notes (Signed)
BP 110/74   Pulse 76   Temp 98.4 F (36.9 C) (Oral)   Ht _0  (1.6 m)   Wt 138 lb 8 oz (62.8 kg)   SpO2 98%   BMI 24.53 kg/m    Subjective:    Patient ID: Judy Munoz, female    DOB: [birth year], 52 y.o.   MRN: [id number]  HPI: Judy Munoz is a 52 y.o. female  Chief Complaint  Patient presents with   Gastroesophageal Reflux   GERD GERD control status: better Satisfied with current treatment? yes Heartburn frequency:  Medication side effects: no  Medication compliance: excellent Previous GERD medications: nexium Dysphagia: yes Odynophagia:  no Hematemesis: no Blood in stool: no EGD: no  Relevant past medical, surgical, family and social history reviewed and updated as indicated. Interim medical history since our last visit reviewed. Allergies and medications reviewed and updated.  Review of Systems  Constitutional: Negative.   HENT:  Positive for congestion and postnasal drip. Negative for dental problem, drooling, ear discharge, ear pain, facial swelling, hearing loss, mouth sores, nosebleeds, rhinorrhea, sinus pressure, sinus pain, sneezing, sore throat, tinnitus, trouble swallowing and voice change.   Eyes: Negative.   Respiratory: Negative.    Cardiovascular: Negative.   Gastrointestinal: Negative.   Psychiatric/Behavioral: Negative.      Per HPI unless specifically indicated above     Objective:    BP 110/74   Pulse 76   Temp 98.4 F (36.9 C) (Oral)   Ht _1  (1.6 m)   Wt 138 lb 8 oz (62.8 kg)   SpO2 98%   BMI 24.53 kg/m   Wt Readings from Last 3 Encounters:  05/04/22 138 lb 8 oz (62.8 kg)  04/07/22 139 lb 12.8 oz (63.4 kg)  12/29/21 141 lb 3.2 oz (64 kg)    Physical Exam Vitals and nursing note reviewed.  Constitutional:      General: She is not in acute distress.    Appearance: Normal appearance. She is not ill-appearing, toxic-appearing or diaphoretic.  HENT:     Head: Normocephalic and atraumatic.     Right Ear: External  ear normal.     Left Ear: External ear normal.     Nose: Nose normal.     Mouth/Throat:     Mouth: Mucous membranes are moist.     Pharynx: Oropharynx is clear.     Comments: Lymph appearing tissue at the back of her throat and swollen tonsil on the L side- smaller than last visit Eyes:     General: No scleral icterus.       Right eye: No discharge.        Left eye: No discharge.     Extraocular Movements: Extraocular movements intact.     Conjunctiva/sclera: Conjunctivae normal.     Pupils: Pupils are equal, round, and reactive to light.  Cardiovascular:     Rate and Rhythm: Normal rate and regular rhythm.     Pulses: Normal pulses.     Heart sounds: Normal heart sounds. No murmur heard.    No friction rub. No gallop.  Pulmonary:     Effort: Pulmonary effort is normal. No respiratory distress.     Breath sounds: Normal breath sounds. No stridor. No wheezing, rhonchi or rales.  Chest:     Chest wall: No tenderness.  Musculoskeletal:        General: Normal range of motion.     Cervical back: Normal range of motion and neck supple.  Skin:  General: Skin is warm and dry.     Capillary Refill: Capillary refill takes less than 2 seconds.     Coloration: Skin is not jaundiced or pale.     Findings: No bruising, erythema, lesion or rash.  Neurological:     General: No focal deficit present.     Mental Status: She is alert and oriented to person, place, and time. Mental status is at baseline.  Psychiatric:        Mood and Affect: Mood normal.        Behavior: Behavior normal.        Thought Content: Thought content normal.        Judgment: Judgment normal.     Results for orders placed or performed in visit on 12/29/21  Microscopic Examination   Urine  Result Value Ref Range   WBC, UA 0-5 0 - 5 /hpf   RBC, Urine None seen 0 - 2 /hpf   Epithelial Cells (non renal) 0-10 0 - 10 /hpf   Bacteria, UA Few (A) None seen/Few  CBC with Differential/Platelet  Result Value Ref Range    WBC 5.8 3.4 - 10.8 x10E3/uL   RBC 4.55 3.77 - 5.28 x10E6/uL   Hemoglobin 13.1 11.1 - 15.9 g/dL   Hematocrit 39.9 34.0 - 46.6 %   MCV 88 79 - 97 fL   MCH 28.8 26.6 - 33.0 pg   MCHC 32.8 31.5 - 35.7 g/dL   RDW 13.0 11.7 - 15.4 %   Platelets 317 150 - 450 x10E3/uL   Neutrophils 52 Not Estab. %   Lymphs 40 Not Estab. %   Monocytes 6 Not Estab. %   Eos 1 Not Estab. %   Basos 1 Not Estab. %   Neutrophils Absolute 3.0 1.4 - 7.0 x10E3/uL   Lymphocytes Absolute 2.3 0.7 - 3.1 x10E3/uL   Monocytes Absolute 0.3 0.1 - 0.9 x10E3/uL   EOS (ABSOLUTE) 0.1 0.0 - 0.4 x10E3/uL   Basophils Absolute 0.0 0.0 - 0.2 x10E3/uL   Immature Granulocytes 0 Not Estab. %   Immature Grans (Abs) 0.0 0.0 - 0.1 x10E3/uL  Comprehensive metabolic panel  Result Value Ref Range   Glucose 105 (H) 70 - 99 mg/dL   BUN 14 6 - 24 mg/dL   Creatinine, Ser 0.94 0.57 - 1.00 mg/dL   eGFR 74 >59 mL/min/1.73   BUN/Creatinine Ratio 15 9 - 23   Sodium 141 134 - 144 mmol/L   Potassium 3.9 3.5 - 5.2 mmol/L   Chloride 106 96 - 106 mmol/L   CO2 21 20 - 29 mmol/L   Calcium 9.7 8.7 - 10.2 mg/dL   Total Protein 6.9 6.0 - 8.5 g/dL   Albumin 4.9 3.9 - 4.9 g/dL   Globulin, Total 2.0 1.5 - 4.5 g/dL   Albumin/Globulin Ratio 2.5 (H) 1.2 - 2.2   Bilirubin Total 0.4 0.0 - 1.2 mg/dL   Alkaline Phosphatase 64 44 - 121 IU/L   AST 16 0 - 40 IU/L   ALT 14 0 - 32 IU/L  Lipid Panel w/o Chol/HDL Ratio  Result Value Ref Range   Cholesterol, Total 167 100 - 199 mg/dL   Triglycerides 61 0 - 149 mg/dL   HDL 73 >39 mg/dL   VLDL Cholesterol Cal 12 5 - 40 mg/dL   LDL Chol Calc (NIH) 82 0 - 99 mg/dL  Urinalysis, Routine w reflex microscopic  Result Value Ref Range   Specific Gravity, UA 1.015 1.005 - 1.030   pH, UA 7.0  5.0 - 7.5   Color, UA Yellow Yellow   Appearance Ur Clear Clear   Leukocytes,UA 1+ (A) Negative   Protein,UA Negative Negative/Trace   Glucose, UA Negative Negative   Ketones, UA Negative Negative   RBC, UA Negative Negative    Bilirubin, UA Negative Negative   Urobilinogen, Ur 0.2 0.2 - 1.0 mg/dL   Nitrite, UA Negative Negative   Microscopic Examination See below:   TSH  Result Value Ref Range   TSH 1.830 0.450 - 4.500 uIU/mL  Bayer DCA Hb A1c Waived  Result Value Ref Range   HB A1C (BAYER DCA - WAIVED) 5.9 (H) 4.8 - 5.6 %  VITAMIN D 25 Hydroxy (Vit-D Deficiency, Fractures)  Result Value Ref Range   Vit D, 25-Hydroxy 36.1 30.0 - 100.0 ng/mL      Assessment & Plan:   Problem List Items Addressed This Visit       Digestive   GERD (gastroesophageal reflux disease) - Primary    Improved. Seeing GI today. Continue to monitor. Call with any concerns.       Other Visit Diagnoses     Abnormal findings on examination of skull and head       Improved from prior, but still there. She is going to see ENT in about 3 weeks. Continue nexium. Call with any concerns.        Follow up plan: Return As scheduled.

## 2022-05-04 NOTE — Progress Notes (Signed)
Sherri Sear, MD [address]  [place]  [place], [place] [address]  Main: [phone]  Fax: [phone]    Gastroenterology Consultation Video Visit  Referring Provider:     Valerie Roys, DO Primary Care Physician:  Valerie Roys, DO Primary Gastroenterologist:  Dr. Cephas Darby Reason for Consultation:  Chronic GERD        HPI:   Judy Munoz is a 52 y.o. female referred by Dr. Wynetta Emery, Barb Merino, DO  for consultation & management of chronic GERD  Virtual Visit Video Note  I connected with [name] on 05/04/22 at  1:00 PM EST by video and verified that I am speaking with the correct person using two identifiers.   I discussed the limitations, risks, security and privacy concerns of performing an evaluation and management service by video and the availability of in person appointments. I also discussed with the patient that there may be a patient responsible charge related to this service. The patient expressed understanding and agreed to proceed.  Location of the Patient: Home  Location of the provider: Home office  Persons participating in the visit: Patient and provider only   History of Present Illness:  Ms. Brocks is a 52 year old female with history of chronic GERD, PPI dependent, history of cholecystectomy is seen in consultation for recent flareup of reflux.  She was evaluated by her PCP, Dr. Wynetta Emery due to flareup of acid reflux that started earlier in December associated with some difficulty swallowing.  She felt pantoprazole 40 mg twice daily was not helping.  Therefore, she was switched to Nexium 40 mg 2 pills a day.  Patient reports that this has significantly improved her symptoms.  She is no longer experiencing difficulty swallowing.  She admits to drinking 2 cups of coffee daily.  She has completely illuminated sweet drinks, carbonated beverages as well as greasy foods.  Her weight has been stable.  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  She does not smoke or drink alcohol.  She underwent upper endoscopy in 06/2015 which revealed gastric intestinal metaplasia on random gastric biopsies with no evidence of H. pylori or atrophic gastritis.  NSAIDs: None  Antiplts/Anticoagulants/Anti thrombotics: None  GI Procedures: EGD 06/2015 by Dr. Allen Norris - Normal esophagus. - Gastritis. Biopsied. - Normal examined duodenum. Chronic inactive gastritis with gastric intestinal metaplasia  Past Medical History:  Diagnosis Date   Allergy Seasonal   Anemia    H/O WITH PREGNANCY   Asthma    Biliary dyskinesia    Elevated glucose    GERD (gastroesophageal reflux disease)    Headache    MIGRAINES RARE   Heart murmur    WAS TOLD THIS ONCE YEARS AGO-HAS NEVER BEEN TOLD SINCE   High cholesterol    History of uterine fibroid    IFG (impaired fasting glucose)    PONV (postoperative nausea and vomiting)    HAPPENED WITH HER 1ST EGD- HER 2ND EGD SHE WAS GIVEN ANTI-EMETIC IV AND HAD NO N/V   Trapezius muscle strain    Vitamin D deficiency     Past Surgical History:  Procedure Laterality Date   CHOLECYSTECTOMY N/A 07/18/2016   Procedure: LAPAROSCOPIC CHOLECYSTECTOMY;  Surgeon: Jules Husbands, MD;  Location: [facility];  Service: General;  Laterality: N/A;   CHOLECYSTECTOMY, LAPAROSCOPIC  07/18/2016   ESOPHAGOGASTRODUODENOSCOPY     ESOPHAGOGASTRODUODENOSCOPY (EGD) WITH PROPOFOL N/A 07/26/2015   Procedure: ESOPHAGOGASTRODUODENOSCOPY (EGD) WITH PROPOFOL;  Surgeon: Lucilla Lame, MD;  Location: [place] [facility];  Service: Endoscopy;  Laterality: N/A;     Current Outpatient Medications:    Na Sulfate-K Sulfate-Mg Sulf 17.5-3.13-1.6 GM/177ML SOLN, Take 354 mLs by mouth once for 1 dose., Disp: 354 mL, Rfl: 0   albuterol (VENTOLIN HFA) 108 (90 Base) MCG/ACT inhaler, TAKE 2 PUFFS BY MOUTH EVERY 6 HOURS AS NEEDED FOR WHEEZE OR SHORTNESS OF BREATH, Disp: 18 g, Rfl: 6   cetirizine (ZYRTEC) 10 MG tablet, Take 10 mg by mouth daily., Disp: ,  Rfl:    Cholecalciferol (VITAMIN D-400 PO), Take 800 mg by mouth. , Disp: , Rfl:    esomeprazole (NEXIUM) 40 MG capsule, Take 2 capsules (80 mg total) by mouth daily., Disp: 60 capsule, Rfl: 3   fluticasone (FLONASE) 50 MCG/ACT nasal spray, Place 2 sprays into both nostrils daily., Disp: 16 g, Rfl: 12   ibuprofen (ADVIL) 800 MG tablet, Take 1 tablet (800 mg total) by mouth every 8 (eight) hours as needed., Disp: 90 tablet, Rfl: 6   levonorgestrel (MIRENA) 20 MCG/24HR IUD, by Intrauterine route once. , Disp: , Rfl:    nortriptyline (PAMELOR) 10 MG capsule, Take 1-2 capsules (10-20 mg total) by mouth at bedtime., Disp: 60 capsule, Rfl: 6   ondansetron (ZOFRAN) 8 MG tablet, Take 1 tablet (8 mg total) by mouth every 8 (eight) hours as needed for nausea or vomiting., Disp: 20 tablet, Rfl: 0   pravastatin (PRAVACHOL) 20 MG tablet, TAKE 1 TABLET BY MOUTH EVERYDAY AT BEDTIME, Disp: 30 tablet, Rfl: 6   topiramate (TOPAMAX) 25 MG tablet, Take 1 tablet (25 mg total) by mouth 2 (two) times daily., Disp: 60 tablet, Rfl: 6   Family History  Problem Relation Age of Onset   Thyroid disease Mother    Diabetes Father    Heart disease Father    Hypertension Father    Hyperlipidemia Father    Lung cancer Maternal Uncle    Cancer Maternal Grandmother        uterine   Thyroid disease Maternal Grandmother    Esophageal cancer Maternal Grandmother    Breast cancer Neg Hx      Social History   Tobacco Use   Smoking status: Never   Smokeless tobacco: Never  Vaping Use   Vaping Use: Never used  Substance Use Topics   Alcohol use: No   Drug use: No    Allergies as of 05/04/2022 - Review Complete 05/04/2022  Allergen Reaction Noted   Sulfa antibiotics Hives 03/02/2015     Imaging Studies: Reviewed  Assessment and Plan:   ALIYANA DLUGOSZ is a 52 y.o. pleasant Caucasian female with history of chronic GERD, s/p cholecystectomy, PPI dependent is seen in consultation for recent flareup of acid  reflux which is currently relieved after switching PPI  Chronic GERD, PPI dependent Currently on Nexium 40 mg 2 pills a day Advised patient to decrease to 1 pill a day since her symptoms are under control Advised to cut back on coffee to 1 cup daily Discussed about antireflux lifestyle, information provided via MyChart Recommend EGD with proximal and distal esophageal biopsies If EGD is unremarkable, will try to wean her down to the lowest dose possible.  If she is dependent on high-dose PPI, recommend evaluation for antireflux surgery Vitamin D levels have been normal  History of gastric intestinal metaplasia Recommend EGD with gastric mapping  Colon cancer screening Discussed about colonoscopy and patient is agreeable  I have discussed alternative options, risks & benefits,  which include,  but are not limited to, bleeding, infection, perforation,respiratory complication & drug reaction.  The patient agrees with this plan & written consent will be obtained.      Follow Up Instructions:   I discussed the assessment and treatment plan with the patient. The patient was provided an opportunity to ask questions and all were answered. The patient agreed with the plan and demonstrated an understanding of the instructions.   The patient was advised to call back or seek an in-person evaluation if the symptoms worsen or if the condition fails to improve as anticipated.  I provided 20 minutes of face-to-face time during this encounter.   Follow up based on the above workup   Cephas Darby, MD

## 2022-05-08 ENCOUNTER — Ambulatory Visit: Payer: 59 | Admitting: Family Medicine

## 2022-05-09 ENCOUNTER — Encounter: Payer: Self-pay | Admitting: Gastroenterology

## 2022-05-18 ENCOUNTER — Ambulatory Visit
Admission: RE | Admit: 2022-05-18 | Discharge: 2022-05-18 | Disposition: A | Discharge status: Home / Self Care | Payer: 59 | Attending: Gastroenterology | Admitting: Gastroenterology

## 2022-05-18 ENCOUNTER — Encounter: Payer: Self-pay | Admitting: Gastroenterology

## 2022-05-18 ENCOUNTER — Encounter
Admission: RE | Disposition: A | Discharge status: Home / Self Care | Payer: Self-pay | Source: Home / Self Care | Attending: Gastroenterology

## 2022-05-18 ENCOUNTER — Ambulatory Visit: Payer: 59 | Admitting: Anesthesiology

## 2022-05-18 ENCOUNTER — Other Ambulatory Visit: Payer: Self-pay

## 2022-05-18 DIAGNOSIS — K635 Polyp of colon: Secondary | ICD-10-CM | POA: Diagnosis not present

## 2022-05-18 DIAGNOSIS — J45909 Unspecified asthma, uncomplicated: Secondary | ICD-10-CM | POA: Insufficient documentation

## 2022-05-18 DIAGNOSIS — E7800 Pure hypercholesterolemia, unspecified: Secondary | ICD-10-CM | POA: Diagnosis not present

## 2022-05-18 DIAGNOSIS — K3189 Other diseases of stomach and duodenum: Secondary | ICD-10-CM | POA: Insufficient documentation

## 2022-05-18 DIAGNOSIS — K317 Polyp of stomach and duodenum: Secondary | ICD-10-CM | POA: Insufficient documentation

## 2022-05-18 DIAGNOSIS — G43909 Migraine, unspecified, not intractable, without status migrainosus: Secondary | ICD-10-CM | POA: Insufficient documentation

## 2022-05-18 DIAGNOSIS — K219 Gastro-esophageal reflux disease without esophagitis: Secondary | ICD-10-CM | POA: Diagnosis not present

## 2022-05-18 DIAGNOSIS — K644 Residual hemorrhoidal skin tags: Secondary | ICD-10-CM | POA: Insufficient documentation

## 2022-05-18 DIAGNOSIS — K31A0 Gastric intestinal metaplasia, unspecified: Secondary | ICD-10-CM

## 2022-05-18 DIAGNOSIS — Z1211 Encounter for screening for malignant neoplasm of colon: Secondary | ICD-10-CM

## 2022-05-18 DIAGNOSIS — K2950 Unspecified chronic gastritis without bleeding: Secondary | ICD-10-CM | POA: Diagnosis not present

## 2022-05-18 DIAGNOSIS — D120 Benign neoplasm of cecum: Secondary | ICD-10-CM | POA: Insufficient documentation

## 2022-05-18 HISTORY — PX: COLONOSCOPY WITH PROPOFOL: SHX5780

## 2022-05-18 HISTORY — PX: POLYPECTOMY: SHX5525

## 2022-05-18 HISTORY — DX: Encounter for screening for malignant neoplasm of colon: Z12.11

## 2022-05-18 HISTORY — PX: ESOPHAGOGASTRODUODENOSCOPY (EGD) WITH PROPOFOL: SHX5813

## 2022-05-18 SURGERY — COLONOSCOPY WITH PROPOFOL
Anesthesia: General | Site: Rectum

## 2022-05-18 MED ORDER — PROPOFOL 10 MG/ML IV BOLUS
INTRAVENOUS | Status: DC | PRN
  Administered 2022-05-18: 100 mg via INTRAVENOUS
  Administered 2022-05-18: 20 mg via INTRAVENOUS
  Administered 2022-05-18: 30 mg via INTRAVENOUS
  Administered 2022-05-18 (×2): 40 mg via INTRAVENOUS
  Administered 2022-05-18: 50 mg via INTRAVENOUS
  Administered 2022-05-18: 40 mg via INTRAVENOUS

## 2022-05-18 MED ORDER — LIDOCAINE HCL (CARDIAC) PF 100 MG/5ML IV SOSY
PREFILLED_SYRINGE | INTRAVENOUS | Status: DC | PRN
  Administered 2022-05-18: 40 mg via INTRAVENOUS

## 2022-05-18 MED ORDER — SODIUM CHLORIDE 0.9 % IV SOLN: INTRAVENOUS | Status: DC

## 2022-05-18 MED ORDER — STERILE WATER FOR IRRIGATION IR SOLN
Status: DC | PRN
  Administered 2022-05-18: 150 mL

## 2022-05-18 MED ORDER — GLYCOPYRROLATE 0.2 MG/ML IJ SOLN
INTRAMUSCULAR | Status: DC | PRN
  Administered 2022-05-18: .1 mg via INTRAVENOUS

## 2022-05-18 MED ORDER — LACTATED RINGERS IV SOLN: INTRAVENOUS | Status: DC

## 2022-05-18 MED ORDER — ONDANSETRON HCL 4 MG/2ML IJ SOLN
INTRAMUSCULAR | Status: DC | PRN
  Administered 2022-05-18: 4 mg via INTRAVENOUS

## 2022-05-18 SURGICAL SUPPLY — 36 items
BALLN DILATOR 12-15 8 (BALLOONS)
BALLN DILATOR 15-18 8 (BALLOONS)
BALLN DILATOR CRE 0-12 8 (BALLOONS)
BALLN DILATOR ESOPH 8 10 CRE (MISCELLANEOUS) IMPLANT
BALLOON DILATOR 12-15 8 (BALLOONS) IMPLANT
BALLOON DILATOR 15-18 8 (BALLOONS) IMPLANT
BALLOON DILATOR CRE 0-12 8 (BALLOONS) IMPLANT
BLOCK BITE 60FR ADLT L/F GRN (MISCELLANEOUS) ×3 IMPLANT
CLIP HMST 235XBRD CATH ROT (MISCELLANEOUS) IMPLANT
CLIP RESOLUTION 360 11X235 (MISCELLANEOUS)
ELECT REM PT RETURN 9FT ADLT (ELECTROSURGICAL)
ELECTRODE REM PT RTRN 9FT ADLT (ELECTROSURGICAL) IMPLANT
FCP ESCP3.2XJMB 240X2.8X (MISCELLANEOUS) ×3
FORCEPS BIOP RAD 4 LRG CAP 4 (CUTTING FORCEPS) IMPLANT
FORCEPS BIOP RJ4 240 W/NDL (MISCELLANEOUS) ×3
FORCEPS ESCP3.2XJMB 240X2.8X (MISCELLANEOUS) IMPLANT
GOWN CVR UNV OPN BCK APRN NK (MISCELLANEOUS) ×6 IMPLANT
GOWN ISOL THUMB LOOP REG UNIV (MISCELLANEOUS) ×6
INJECTOR VARIJECT VIN23 (MISCELLANEOUS) IMPLANT
KIT DEFENDO VALVE AND CONN (KITS) IMPLANT
KIT PRC NS LF DISP ENDO (KITS) ×3 IMPLANT
KIT PROCEDURE OLYMPUS (KITS) ×3
MANIFOLD NEPTUNE II (INSTRUMENTS) ×3 IMPLANT
MARKER SPOT ENDO TATTOO 5ML (MISCELLANEOUS) IMPLANT
PROBE APC STR FIRE (PROBE) IMPLANT
RETRIEVER NET PLAT FOOD (MISCELLANEOUS) IMPLANT
RETRIEVER NET ROTH 2.5X230 LF (MISCELLANEOUS) IMPLANT
SNARE COLD EXACTO (MISCELLANEOUS) IMPLANT
SNARE SHORT THROW 13M SML OVAL (MISCELLANEOUS) IMPLANT
SNARE SHORT THROW 30M LRG OVAL (MISCELLANEOUS) IMPLANT
SNARE SNG USE RND 15MM (INSTRUMENTS) IMPLANT
SYR INFLATION 60ML (SYRINGE) IMPLANT
TRAP ETRAP POLY (MISCELLANEOUS) IMPLANT
VARIJECT INJECTOR VIN23 (MISCELLANEOUS)
WATER STERILE IRR 250ML POUR (IV SOLUTION) ×3 IMPLANT
WIRE CRE 18-20MM 8CM F G (MISCELLANEOUS) IMPLANT

## 2022-05-18 NOTE — Transfer of Care (Signed)
Immediate Anesthesia Transfer of Care Note  Patient: Judy Munoz  Procedure(s) Performed: COLONOSCOPY WITH PROPOFOL (Rectum) ESOPHAGOGASTRODUODENOSCOPY (EGD) WITH PROPOFOL (Mouth) POLYPECTOMY (Rectum)  Patient Location: PACU  Anesthesia Type: General  Level of Consciousness: awake, alert  and patient cooperative  Airway and Oxygen Therapy: Patient Spontanous Breathing and Patient connected to supplemental oxygen  Post-op Assessment: Post-op Vital signs reviewed, Patient's Cardiovascular Status Stable, Respiratory Function Stable, Patent Airway and No signs of Nausea or vomiting  Post-op Vital Signs: Reviewed and stable  Complications: No notable events documented.

## 2022-05-18 NOTE — Anesthesia Postprocedure Evaluation (Signed)
Anesthesia Post Note  Patient: Judy Munoz  Procedure(s) Performed: COLONOSCOPY WITH PROPOFOL (Rectum) ESOPHAGOGASTRODUODENOSCOPY (EGD) WITH PROPOFOL (Mouth) POLYPECTOMY (Rectum)  Patient location during evaluation: Endoscopy Anesthesia Type: General Level of consciousness: awake and alert Pain management: pain level controlled Vital Signs Assessment: post-procedure vital signs reviewed and stable Respiratory status: spontaneous breathing, nonlabored ventilation, respiratory function stable and patient connected to nasal cannula oxygen Cardiovascular status: blood pressure returned to baseline and stable Postop Assessment: no apparent nausea or vomiting Anesthetic complications: no   No notable events documented.   Last Vitals:  Vitals:   05/18/22 0849 05/18/22 1031  BP: 107/70 111/79  Pulse: 99 95  Resp: 12 18  Temp: 36.6 C (!) 36.4 C  SpO2: 100% 100%    Last Pain:  Vitals:   05/18/22 1031  PainSc: Asleep                 Arita Miss

## 2022-05-18 NOTE — Op Note (Signed)
[facility] Gastroenterology Patient Name: Judy Munoz Procedure Date: 05/18/2022 9:42 AM MRN: [id number] Account #: [id number] Date of Birth: 11-06-1970 Admit Type: Outpatient Age: 52 Room: [facility] OR ROOM 01 Gender: Female Note Status: Finalized Instrument Name: [id number] Procedure:             Colonoscopy Indications:           Screening for colorectal malignant neoplasm Providers:             Lin Landsman MD, MD Referring MD:          Valerie Roys (Referring MD) Medicines:             General Anesthesia Complications:         No immediate complications. Estimated blood loss: None. Procedure:             Pre-Anesthesia Assessment:                        - Prior to the procedure, a History and Physical was                         performed, and patient medications and allergies were                         reviewed. The patient is competent. The risks and                         benefits of the procedure and the sedation options and                         risks were discussed with the patient. All questions                         were answered and informed consent was obtained.                         Patient identification and proposed procedure were                         verified by the physician, the nurse, the                         anesthesiologist, the anesthetist and the technician                         in the pre-procedure area in the procedure room in the                         endoscopy suite. Mental Status Examination: alert and                         oriented. Airway Examination: normal oropharyngeal                         airway and neck mobility. Respiratory Examination:                         clear to auscultation. CV Examination: normal.  Prophylactic Antibiotics: The patient does not require                         prophylactic antibiotics. Prior Anticoagulants: The                         patient has  taken no anticoagulant or antiplatelet                         agents. ASA Grade Assessment: II - A patient with mild                         systemic disease. After reviewing the risks and                         benefits, the patient was deemed in satisfactory                         condition to undergo the procedure. The anesthesia                         plan was to use general anesthesia. Immediately prior                         to administration of medications, the patient was                         re-assessed for adequacy to receive sedatives. The                         heart rate, respiratory rate, oxygen saturations,                         blood pressure, adequacy of pulmonary ventilation, and                         response to care were monitored throughout the                         procedure. The physical status of the patient was                         re-assessed after the procedure.                        After obtaining informed consent, the colonoscope was                         passed under direct vision. Throughout the procedure,                         the patient's blood pressure, pulse, and oxygen                         saturations were monitored continuously. The                         Colonoscope was introduced through the anus and  advanced to the the cecum, identified by appendiceal                         orifice and ileocecal valve. The colonoscopy was                         performed without difficulty. The patient tolerated                         the procedure well. The quality of the bowel                         preparation was evaluated using the BBPS [facility] Bowel                         Preparation Scale) with scores of: Right Colon = 3,                         Transverse Colon = 3 and Left Colon = 3 (entire mucosa                         seen well with no residual staining, small fragments                         of  stool or opaque liquid). The total BBPS score                         equals 9. The ileocecal valve, appendiceal orifice,                         and rectum were photographed. Findings:      The perianal and digital rectal examinations were normal. Pertinent       negatives include normal sphincter tone and no palpable rectal lesions.      A small polyp was found in the cecum. The polyp was sessile. The polyp       was removed with a jumbo cold forceps. Resection and retrieval were       complete. Estimated blood loss: none.      The exam was otherwise without abnormality.      The retroflexed view of the distal rectum and anal verge was normal and       showed no anal or rectal abnormalities.      Skin tags were found on perianal exam. Impression:            - One small polyp in the cecum, removed with a jumbo                         cold forceps. Resected and retrieved.                        - The examination was otherwise normal.                        - The distal rectum and anal verge are normal on                         retroflexion view.                        -  Perianal skin tags found on perianal exam. Recommendation:        - Discharge patient to home (with escort).                        - Resume previous diet today.                        - Continue present medications.                        - Await pathology results.                        - Repeat colonoscopy in 7-10 years for surveillance                         based on pathology results. Procedure Code(s):     --- Professional ---                        [phone], Colonoscopy, flexible; with biopsy, single or                         multiple Diagnosis Code(s):     --- Professional ---                        Z12.11, Encounter for screening for malignant neoplasm                         of colon                        D12.0, Benign neoplasm of cecum                        K64.4, Residual hemorrhoidal skin tags CPT  copyright 2022 American Medical Association. All rights reserved. The codes documented in this report are preliminary and upon coder review may  be revised to meet current compliance requirements. Dr. Ulyess Mort Lin Landsman MD, MD 05/18/2022 10:30:29 AM This report has been signed electronically. Number of Addenda: 0 Note Initiated On: 05/18/2022 9:42 AM Scope Withdrawal Time: 0 hours 7 minutes 51 seconds  Total Procedure Duration: 0 hours 10 minutes 37 seconds  Estimated Blood Loss:  Estimated blood loss: none.      [facility]

## 2022-05-18 NOTE — H&P (Signed)
Cephas Darby, MD [address]  [place]  [place], [place] [address]  Main: [phone]  Fax: [phone] Pager: [phone]  Primary Care Physician:  Valerie Roys, DO Primary Gastroenterologist:  Dr. Cephas Darby  Pre-Procedure History & Physical: HPI:  Judy Munoz is a 52 y.o. female is here for an endoscopy and colonoscopy.   Past Medical History:  Diagnosis Date   Allergy Seasonal   Anemia    H/O WITH PREGNANCY   Asthma    Biliary dyskinesia    Elevated glucose    GERD (gastroesophageal reflux disease)    Headache    MIGRAINES RARE   Heart murmur    WAS TOLD THIS ONCE YEARS AGO-HAS NEVER BEEN TOLD SINCE   High cholesterol    History of uterine fibroid    IFG (impaired fasting glucose)    PONV (postoperative nausea and vomiting)    HAPPENED WITH HER 1ST EGD- HER 2ND EGD SHE WAS GIVEN ANTI-EMETIC IV AND HAD NO N/V   Trapezius muscle strain    Vitamin D deficiency     Past Surgical History:  Procedure Laterality Date   CHOLECYSTECTOMY N/A 07/18/2016   Procedure: LAPAROSCOPIC CHOLECYSTECTOMY;  Surgeon: Jules Husbands, MD;  Location: [facility] ORS;  Service: General;  Laterality: N/A;   CHOLECYSTECTOMY, LAPAROSCOPIC  07/18/2016   ESOPHAGOGASTRODUODENOSCOPY     ESOPHAGOGASTRODUODENOSCOPY (EGD) WITH PROPOFOL N/A 07/26/2015   Procedure: ESOPHAGOGASTRODUODENOSCOPY (EGD) WITH PROPOFOL;  Surgeon: Lucilla Lame, MD;  Location: [place];  Service: Endoscopy;  Laterality: N/A;    Prior to Admission medications   Medication Sig Start Date End Date Taking? Authorizing Provider  albuterol (VENTOLIN HFA) 108 (90 Base) MCG/ACT inhaler TAKE 2 PUFFS BY MOUTH EVERY 6 HOURS AS NEEDED FOR WHEEZE OR SHORTNESS OF BREATH 12/29/21  Yes Johnson, Megan P, DO  cetirizine (ZYRTEC) 10 MG tablet Take 10 mg by mouth daily.   Yes [provider]  Cholecalciferol (VITAMIN D-400 PO) Take 800 mg by mouth.    Yes [provider]  esomeprazole (NEXIUM) 40 MG  capsule Take 2 capsules (80 mg total) by mouth daily. 04/07/22  Yes Johnson, Megan P, DO  fluticasone (FLONASE) 50 MCG/ACT nasal spray Place 2 sprays into both nostrils daily. 06/09/21  Yes Johnson, Megan P, DO  ibuprofen (ADVIL) 800 MG tablet Take 1 tablet (800 mg total) by mouth every 8 (eight) hours as needed. 12/29/21  Yes Johnson, Megan P, DO  levonorgestrel (MIRENA) 20 MCG/24HR IUD by Intrauterine route once.  05/08/19  Yes [provider]  nortriptyline (PAMELOR) 10 MG capsule Take 1-2 capsules (10-20 mg total) by mouth at bedtime. 12/29/21  Yes Johnson, Megan P, DO  ondansetron (ZOFRAN) 8 MG tablet Take 1 tablet (8 mg total) by mouth every 8 (eight) hours as needed for nausea or vomiting. 08/20/20  Yes Johnson, Megan P, DO  pravastatin (PRAVACHOL) 20 MG tablet TAKE 1 TABLET BY MOUTH EVERYDAY AT BEDTIME 12/29/21  Yes Johnson, Megan P, DO  topiramate (TOPAMAX) 25 MG tablet Take 1 tablet (25 mg total) by mouth 2 (two) times daily. 12/29/21  Yes Johnson, Megan P, DO    Allergies as of 05/04/2022 - Review Complete 05/04/2022  Allergen Reaction Noted   Sulfa antibiotics Hives 03/02/2015    Family History  Problem Relation Age of Onset   Thyroid disease Mother    Diabetes Father    Heart disease Father    Hypertension Father    Hyperlipidemia Father    Lung cancer  Maternal Uncle    Cancer Maternal Grandmother        uterine   Thyroid disease Maternal Grandmother    Esophageal cancer Maternal Grandmother    Breast cancer Neg Hx     Social History   Socioeconomic History   Marital status: Married    Spouse name: Not on file   Number of children: Not on file   Years of education: Not on file   Highest education level: Not on file  Occupational History   Not on file  Tobacco Use   Smoking status: Never   Smokeless tobacco: Never  Vaping Use   Vaping Use: Never used  Substance and Sexual Activity   Alcohol use: No   Drug use: No   Sexual activity: Yes    Birth  control/protection: I.U.D.  Other Topics Concern   Not on file  Social History Narrative   Not on file   Social Determinants of Health   Financial Resource Strain: Not on file  Food Insecurity: Not on file  Transportation Needs: Not on file  Physical Activity: Sufficiently Active (03/13/2017)   Exercise Vital Sign    Days of Exercise per Week: 7 days    Minutes of Exercise per Session: 60 min  Stress: No Stress Concern Present (03/13/2017)   [place]    Feeling of Stress : Only a little  Social Connections: Unknown (03/13/2017)   Social Connection and Isolation Panel [NHANES]    Frequency of Communication with Friends and Family: Patient refused    Frequency of Social Gatherings with Friends and Family: Patient refused    Attends Religious Services: Patient refused    Active Member of Clubs or Organizations: Patient refused    Attends Archivist Meetings: Patient refused    Marital Status: Patient refused  Intimate Partner Violence: Unknown (03/13/2017)   Humiliation, Afraid, Rape, and Kick questionnaire    Fear of Current or Ex-Partner: Patient refused    Emotionally Abused: Patient refused    Physically Abused: Patient refused    Sexually Abused: Patient refused    Review of Systems: See HPI, otherwise negative ROS  Physical Exam: BP 107/70   Pulse 99   Temp 97.8 F (36.6 C)   Resp 12   Ht '5\' 3"'$  (1.6 m)   Wt 61.2 kg   SpO2 100%   BMI 23.91 kg/m  General:   Alert,  pleasant and cooperative in NAD Head:  Normocephalic and atraumatic. Neck:  Supple; no masses or thyromegaly. Lungs:  Clear throughout to auscultation.    Heart:  Regular rate and rhythm. Abdomen:  Soft, nontender and nondistended. Normal bowel sounds, without guarding, and without rebound.   Neurologic:  Alert and  oriented x4;  grossly normal neurologically.  Impression/Plan: Judy Munoz is here for an endoscopy  and colonoscopy to be performed for chronic GERD, History of gastric intestinal metaplasia , colon cancer screening  Risks, benefits, limitations, and alternatives regarding  endoscopy and colonoscopy have been reviewed with the patient.  Questions have been answered.  All parties agreeable.   Sherri Sear, MD  05/18/2022, 9:08 AM

## 2022-05-18 NOTE — Op Note (Signed)
[facility] Gastroenterology Patient Name: Judy Munoz Procedure Date: 05/18/2022 9:48 AM MRN: [id number] Account #: [id number] Date of Birth: [birth year] Admit Type: Outpatient Age: 52 Room: [facility] OR ROOM 01 Gender: Female Note Status: Finalized Instrument Name: [id number] Procedure:             Upper GI endoscopy Indications:           Follow-up of gastro-esophageal reflux disease,                         Follow-up of intestinal metaplasia Providers:             Lin Landsman MD, MD Referring MD:          Valerie Roys (Referring MD) Medicines:             General Anesthesia Complications:         No immediate complications. Estimated blood loss: None. Procedure:             Pre-Anesthesia Assessment:                        - Prior to the procedure, a History and Physical was                         performed, and patient medications and allergies were                         reviewed. The patient is competent. The risks and                         benefits of the procedure and the sedation options and                         risks were discussed with the patient. All questions                         were answered and informed consent was obtained.                         Patient identification and proposed procedure were                         verified by the physician, the nurse, the                         anesthesiologist, the anesthetist and the technician                         in the pre-procedure area in the procedure room in the                         endoscopy suite. Mental Status Examination: alert and                         oriented. Airway Examination: normal oropharyngeal                         airway and neck mobility. Respiratory Examination:  clear to auscultation. CV Examination: normal.                         Prophylactic Antibiotics: The patient does not require                         prophylactic  antibiotics. Prior Anticoagulants: The                         patient has taken no anticoagulant or antiplatelet                         agents. ASA Grade Assessment: II - A patient with mild                         systemic disease. After reviewing the risks and                         benefits, the patient was deemed in satisfactory                         condition to undergo the procedure. The anesthesia                         plan was to use general anesthesia. Immediately prior                         to administration of medications, the patient was                         re-assessed for adequacy to receive sedatives. The                         heart rate, respiratory rate, oxygen saturations,                         blood pressure, adequacy of pulmonary ventilation, and                         response to care were monitored throughout the                         procedure. The physical status of the patient was                         re-assessed after the procedure.                        After obtaining informed consent, the endoscope was                         passed under direct vision. Throughout the procedure,                         the patient's blood pressure, pulse, and oxygen                         saturations were monitored continuously. The Endoscope  was introduced through the mouth, and advanced to the                         second part of duodenum. The upper GI endoscopy was                         accomplished without difficulty. The patient tolerated                         the procedure well. Findings:      The gastroesophageal junction and examined esophagus were normal.       Biopsies were taken with a cold forceps for histology.      Esophagogastric landmarks were identified: the gastroesophageal junction       was found at 36 cm from the incisors.      Gastric mapping was performed for intestinal metaplasia and NBI was        performed      Striped mildly erythematous mucosa without bleeding was found in the       gastric antrum. Biopsies were taken with a cold forceps for histology.      A few small sessile fundic gland polyps with no bleeding and no stigmata       of recent bleeding were found in the gastric fundus.      The gastric body and incisura were normal. Biopsies were taken with a       cold forceps for histology.      The cardia and gastric fundus were normal on retroflexion.      The duodenal bulb and second portion of the duodenum were normal. Impression:            - Normal gastroesophageal junction and esophagus.                         Biopsied.                        - Esophagogastric landmarks identified.                        - Erythematous mucosa in the antrum. Biopsied.                        - A few gastric polyps.                        - Normal gastric body and incisura. Biopsied.                        - Normal duodenal bulb and second portion of the                         duodenum. Recommendation:        - Await pathology results.                        - Proceed with colonoscopy as scheduled                        See colonoscopy report                        -  Follow an antireflux regimen. Procedure Code(s):     --- Professional ---                        [phone], Esophagogastroduodenoscopy, flexible,                         transoral; with biopsy, single or multiple Diagnosis Code(s):     --- Professional ---                        K31.89, Other diseases of stomach and duodenum                        K31.7, Polyp of stomach and duodenum                        K21.9, Gastro-esophageal reflux disease without                         esophagitis                        K31.A0, Gastric intestinal metaplasia, unspecified CPT copyright 2022 American Medical Association. All rights reserved. The codes documented in this report are preliminary and upon coder review may  be revised to  meet current compliance requirements. Dr. Ulyess Mort Lin Landsman MD, MD 05/18/2022 10:15:58 AM This report has been signed electronically. Number of Addenda: 0 Note Initiated On: 05/18/2022 9:48 AM Total Procedure Duration: 0 hours 12 minutes 26 seconds  Estimated Blood Loss:  Estimated blood loss: none. Estimated blood loss: none.      [facility]

## 2022-05-18 NOTE — Anesthesia Preprocedure Evaluation (Signed)
Anesthesia Evaluation  Patient identified by MRN, date of birth, ID band Patient awake    Reviewed: Allergy & Precautions, NPO status , Patient's Chart, lab work & pertinent test results  History of Anesthesia Complications (+) PONV and history of anesthetic complications  Airway Mallampati: II  TM Distance: >3 FB Neck ROM: Full    Dental no notable dental hx. (+) Teeth Intact   Pulmonary asthma , neg sleep apnea, neg COPD, Patient abstained from smoking.Not current smoker Asthma associated with seasonal allergies, rare inhaler use   Pulmonary exam normal breath sounds clear to auscultation       Cardiovascular Exercise Tolerance: Good METS(-) hypertension(-) CAD and (-) Past MI negative cardio ROS (-) dysrhythmias  Rhythm:Regular Rate:Normal - Systolic murmurs    Neuro/Psych  Headaches  negative psych ROS   GI/Hepatic ,GERD  Controlled and Medicated,,(+)     (-) substance abuse    Endo/Other  neg diabetes    Renal/GU negative Renal ROS     Musculoskeletal   Abdominal   Peds  Hematology   Anesthesia Other Findings Past Medical History: Seasonal: Allergy No date: Anemia     Comment:  H/O WITH PREGNANCY No date: Asthma No date: Biliary dyskinesia No date: Elevated glucose No date: GERD (gastroesophageal reflux disease) No date: Headache     Comment:  MIGRAINES RARE No date: Heart murmur     Comment:  WAS TOLD THIS ONCE YEARS AGO-HAS NEVER BEEN TOLD SINCE No date: High cholesterol No date: History of uterine fibroid No date: IFG (impaired fasting glucose) No date: PONV (postoperative nausea and vomiting)     Comment:  HAPPENED WITH HER 1ST EGD- HER 2ND EGD SHE WAS GIVEN               ANTI-EMETIC IV AND HAD NO N/V No date: Trapezius muscle strain No date: Vitamin D deficiency  Reproductive/Obstetrics                             Anesthesia Physical Anesthesia Plan  ASA:  2  Anesthesia Plan: General   Post-op Pain Management: Minimal or no pain anticipated   Induction: Intravenous  PONV Risk Score and Plan: 4 or greater and Propofol infusion, TIVA and Ondansetron  Airway Management Planned: Nasal Cannula  Additional Equipment: None  Intra-op Plan:   Post-operative Plan:   Informed Consent: I have reviewed the patients History and Physical, chart, labs and discussed the procedure including the risks, benefits and alternatives for the proposed anesthesia with the patient or authorized representative who has indicated his/her understanding and acceptance.     Dental advisory given  Plan Discussed with: CRNA and Surgeon  Anesthesia Plan Comments: (Discussed risks of anesthesia with patient, including possibility of difficulty with spontaneous ventilation under anesthesia necessitating airway intervention, PONV, and rare risks such as cardiac or respiratory or neurological events, and allergic reactions. Discussed the role of CRNA in patient's perioperative care. Patient understands.)       Anesthesia Quick Evaluation

## 2022-05-19 ENCOUNTER — Encounter: Payer: Self-pay | Admitting: Gastroenterology

## 2022-05-23 ENCOUNTER — Ambulatory Visit
Admission: RE | Admit: 2022-05-23 | Discharge: 2022-05-23 | Disposition: A | Discharge status: Home / Self Care | Payer: 59 | Source: Ambulatory Visit | Attending: Nurse Practitioner | Admitting: Nurse Practitioner

## 2022-05-23 DIAGNOSIS — Z1231 Encounter for screening mammogram for malignant neoplasm of breast: Secondary | ICD-10-CM | POA: Diagnosis not present

## 2022-05-23 LAB — SURGICAL PATHOLOGY

## 2022-05-24 DIAGNOSIS — J301 Allergic rhinitis due to pollen: Secondary | ICD-10-CM | POA: Diagnosis not present

## 2022-05-26 ENCOUNTER — Encounter: Payer: Self-pay | Admitting: Gastroenterology

## 2022-06-29 ENCOUNTER — Ambulatory Visit: Payer: 59 | Admitting: Family Medicine

## 2022-06-29 ENCOUNTER — Encounter: Payer: Self-pay | Admitting: Family Medicine

## 2022-06-29 VITALS — BP 104/71 | HR 71 | Ht 63.0 in | Wt 137.3 lb

## 2022-06-29 DIAGNOSIS — E559 Vitamin D deficiency, unspecified: Secondary | ICD-10-CM | POA: Diagnosis not present

## 2022-06-29 DIAGNOSIS — E7800 Pure hypercholesterolemia, unspecified: Secondary | ICD-10-CM

## 2022-06-29 DIAGNOSIS — K219 Gastro-esophageal reflux disease without esophagitis: Secondary | ICD-10-CM | POA: Diagnosis not present

## 2022-06-29 DIAGNOSIS — R7301 Impaired fasting glucose: Secondary | ICD-10-CM

## 2022-06-29 LAB — BAYER DCA HB A1C WAIVED: HB A1C (BAYER DCA - WAIVED): 6.1 % — ABNORMAL HIGH (ref 4.8–5.6)

## 2022-06-29 MED ORDER — PRAVASTATIN SODIUM 20 MG PO TABS: ORAL_TABLET | ORAL | 6 refills | Status: DC

## 2022-06-29 MED ORDER — NORTRIPTYLINE HCL 10 MG PO CAPS: 10.0000 mg | ORAL_CAPSULE | Freq: Every day | ORAL | 6 refills | Status: DC

## 2022-06-29 MED ORDER — TOPIRAMATE 25 MG PO TABS: 25.0000 mg | ORAL_TABLET | Freq: Two times a day (BID) | ORAL | 6 refills | Status: DC

## 2022-06-29 MED ORDER — ESOMEPRAZOLE MAGNESIUM 40 MG PO CPDR: 40.0000 mg | DELAYED_RELEASE_CAPSULE | Freq: Every day | ORAL | 6 refills | Status: DC

## 2022-06-29 MED ORDER — ALBUTEROL SULFATE HFA 108 (90 BASE) MCG/ACT IN AERS: INHALATION_SPRAY | RESPIRATORY_TRACT | 6 refills | Status: DC

## 2022-06-29 NOTE — Assessment & Plan Note (Signed)
Working on dietary changes. Continue to follow with GI. Refills given today. Call

## 2022-06-29 NOTE — Progress Notes (Signed)
BP 104/71   Pulse 71   Ht '5\' 3"'$  (1.6 m)   Wt 137 lb 4.8 oz (62.3 kg)   SpO2 99%   BMI 24.32 kg/m    Subjective:    Patient ID: Judy Munoz, female    DOB: Oct 15, 1970, 52 y.o.   MRN: [id number]  HPI: Judy Munoz is a 52 y.o. female  Chief Complaint  Patient presents with   Gastroesophageal Reflux   Hyperlipidemia   IFG   Impaired Fasting Glucose HbA1C:  Lab Results  Component Value Date   HGBA1C 6.1 (H) 06/29/2022   Duration of elevated blood sugar: chronic Polydipsia: no Polyuria: no Weight change: no Visual disturbance: no Glucose Monitoring: no Diabetic Education: Not Completed Family history of diabetes: yes  HYPERLIPIDEMIA Hyperlipidemia status: excellent compliance Satisfied with current treatment?  yes Side effects:  no Medication compliance: excellent compliance Past cholesterol meds: pravastatin Supplements: none Aspirin:  no The 10-year ASCVD risk score (Arnett DK, et al., 2019) is: 0.5%   Values used to calculate the score:     Age: 62 years     Sex: Female     Is Non-Hispanic African American: No     Diabetic: No     Tobacco smoker: No     Systolic Blood Pressure: 123456 mmHg     Is BP treated: No     HDL Cholesterol: 68 mg/dL     Total Cholesterol: 163 mg/dL Chest pain:  no Coronary artery disease:  no Family history CAD:  yes  GERD GERD control status: stable Satisfied with current treatment? yes Heartburn frequency: daily Medication side effects: no  Medication compliance: better Dysphagia: no Odynophagia:  no Hematemesis: no Blood in stool: no EGD: yes  Relevant past medical, surgical, family and social history reviewed and updated as indicated. Interim medical history since our last visit reviewed. Allergies and medications reviewed and updated.  Review of Systems  Constitutional: Negative.   Respiratory: Negative.    Cardiovascular: Negative.   Gastrointestinal: Negative.   Musculoskeletal: Negative.    Neurological: Negative.   Psychiatric/Behavioral: Negative.      Per HPI unless specifically indicated above     Objective:    BP 104/71   Pulse 71   Ht '5\' 3"'$  (1.6 m)   Wt 137 lb 4.8 oz (62.3 kg)   SpO2 99%   BMI 24.32 kg/m   Wt Readings from Last 3 Encounters:  06/29/22 137 lb 4.8 oz (62.3 kg)  05/18/22 135 lb (61.2 kg)  05/04/22 138 lb 8 oz (62.8 kg)    Physical Exam Vitals and nursing note reviewed.  Constitutional:      General: She is not in acute distress.    Appearance: Normal appearance. She is not ill-appearing, toxic-appearing or diaphoretic.  HENT:     Head: Normocephalic and atraumatic.     Right Ear: External ear normal.     Left Ear: External ear normal.     Nose: Nose normal.     Mouth/Throat:     Mouth: Mucous membranes are moist.     Pharynx: Oropharynx is clear.  Eyes:     General: No scleral icterus.       Right eye: No discharge.        Left eye: No discharge.     Extraocular Movements: Extraocular movements intact.     Conjunctiva/sclera: Conjunctivae normal.     Pupils: Pupils are equal, round, and reactive to light.  Cardiovascular:     Rate  and Rhythm: Normal rate and regular rhythm.     Pulses: Normal pulses.     Heart sounds: Normal heart sounds. No murmur heard.    No friction rub. No gallop.  Pulmonary:     Effort: Pulmonary effort is normal. No respiratory distress.     Breath sounds: Normal breath sounds. No stridor. No wheezing, rhonchi or rales.  Chest:     Chest wall: No tenderness.  Musculoskeletal:        General: Normal range of motion.     Cervical back: Normal range of motion and neck supple.  Skin:    General: Skin is warm and dry.     Capillary Refill: Capillary refill takes less than 2 seconds.     Coloration: Skin is not jaundiced or pale.     Findings: No bruising, erythema, lesion or rash.  Neurological:     General: No focal deficit present.     Mental Status: She is alert and oriented to person, place, and  time. Mental status is at baseline.  Psychiatric:        Mood and Affect: Mood normal.        Behavior: Behavior normal.        Thought Content: Thought content normal.        Judgment: Judgment normal.     Results for orders placed or performed in visit on 06/29/22  VITAMIN D 25 Hydroxy (Vit-D Deficiency, Fractures)  Result Value Ref Range   Vit D, 25-Hydroxy 35.7 30.0 - 100.0 ng/mL  Comprehensive metabolic panel  Result Value Ref Range   Glucose 97 70 - 99 mg/dL   BUN 12 6 - 24 mg/dL   Creatinine, Ser 0.80 0.57 - 1.00 mg/dL   eGFR 90 >59 mL/min/1.73   BUN/Creatinine Ratio 15 9 - 23   Sodium 145 (H) 134 - 144 mmol/L   Potassium 4.2 3.5 - 5.2 mmol/L   Chloride 109 (H) 96 - 106 mmol/L   CO2 23 20 - 29 mmol/L   Calcium 9.6 8.7 - 10.2 mg/dL   Total Protein 6.5 6.0 - 8.5 g/dL   Albumin 4.6 3.9 - 4.9 g/dL   Globulin, Total 1.9 1.5 - 4.5 g/dL   Albumin/Globulin Ratio 2.4 (H) 1.2 - 2.2   Bilirubin Total 0.2 0.0 - 1.2 mg/dL   Alkaline Phosphatase 67 44 - 121 IU/L   AST 14 0 - 40 IU/L   ALT 14 0 - 32 IU/L  CBC with Differential/Platelet  Result Value Ref Range   WBC 6.1 3.4 - 10.8 x10E3/uL   RBC 4.56 3.77 - 5.28 x10E6/uL   Hemoglobin 12.8 11.1 - 15.9 g/dL   Hematocrit 39.8 34.0 - 46.6 %   MCV 87 79 - 97 fL   MCH 28.1 26.6 - 33.0 pg   MCHC 32.2 31.5 - 35.7 g/dL   RDW 13.1 11.7 - 15.4 %   Platelets 340 150 - 450 x10E3/uL   Neutrophils 53 Not Estab. %   Lymphs 39 Not Estab. %   Monocytes 6 Not Estab. %   Eos 1 Not Estab. %   Basos 1 Not Estab. %   Neutrophils Absolute 3.3 1.4 - 7.0 x10E3/uL   Lymphocytes Absolute 2.4 0.7 - 3.1 x10E3/uL   Monocytes Absolute 0.4 0.1 - 0.9 x10E3/uL   EOS (ABSOLUTE) 0.1 0.0 - 0.4 x10E3/uL   Basophils Absolute 0.0 0.0 - 0.2 x10E3/uL   Immature Granulocytes 0 Not Estab. %   Immature Grans (Abs) 0.0 0.0 - 0.1  x10E3/uL  Lipid Panel w/o Chol/HDL Ratio  Result Value Ref Range   Cholesterol, Total 163 100 - 199 mg/dL   Triglycerides 53 0 - 149  mg/dL   HDL 68 >39 mg/dL   VLDL Cholesterol Cal 11 5 - 40 mg/dL   LDL Chol Calc (NIH) 84 0 - 99 mg/dL  Bayer DCA Hb A1c Waived  Result Value Ref Range   HB A1C (BAYER DCA - WAIVED) 6.1 (H) 4.8 - 5.6 %      Assessment & Plan:   Problem List Items Addressed This Visit       Digestive   GERD (gastroesophageal reflux disease)    Working on dietary changes. Continue to follow with GI. Refills given today. Call       Relevant Medications   esomeprazole (NEXIUM) 40 MG capsule   Other Relevant Orders   Comprehensive metabolic panel (Completed)   CBC with Differential/Platelet (Completed)     Endocrine   IFG (impaired fasting glucose) - Primary    Rechecking labs today. Await results. Treat as needed.       Relevant Orders   Comprehensive metabolic panel (Completed)   CBC with Differential/Platelet (Completed)   Bayer DCA Hb A1c Waived (Completed)     Other   High cholesterol    Under good control on current regimen. Continue current regimen. Continue to monitor. Call with any concerns. Refills given. Labs drawn today.        Relevant Medications   pravastatin (PRAVACHOL) 20 MG tablet   Other Relevant Orders   Comprehensive metabolic panel (Completed)   CBC with Differential/Platelet (Completed)   Lipid Panel w/o Chol/HDL Ratio (Completed)   Vitamin D deficiency    Rechecking labs today. Await results. Treat as needed.       Relevant Orders   VITAMIN D 25 Hydroxy (Vit-D Deficiency, Fractures) (Completed)   Comprehensive metabolic panel (Completed)   CBC with Differential/Platelet (Completed)     Follow up plan: Return in about 6 months (around 12/28/2022) for physical.

## 2022-06-30 LAB — VITAMIN D 25 HYDROXY (VIT D DEFICIENCY, FRACTURES): Vit D, 25-Hydroxy: 35.7 ng/mL (ref 30.0–100.0)

## 2022-06-30 LAB — COMPREHENSIVE METABOLIC PANEL
ALT: 14 IU/L (ref 0–32)
AST: 14 IU/L (ref 0–40)
Albumin/Globulin Ratio: 2.4 — ABNORMAL HIGH (ref 1.2–2.2)
Albumin: 4.6 g/dL (ref 3.9–4.9)
Alkaline Phosphatase: 67 IU/L (ref 44–121)
BUN/Creatinine Ratio: 15 (ref 9–23)
BUN: 12 mg/dL (ref 6–24)
Bilirubin Total: 0.2 mg/dL (ref 0.0–1.2)
CO2: 23 mmol/L (ref 20–29)
Calcium: 9.6 mg/dL (ref 8.7–10.2)
Chloride: 109 mmol/L — ABNORMAL HIGH (ref 96–106)
Creatinine, Ser: 0.8 mg/dL (ref 0.57–1.00)
Globulin, Total: 1.9 g/dL (ref 1.5–4.5)
Glucose: 97 mg/dL (ref 70–99)
Potassium: 4.2 mmol/L (ref 3.5–5.2)
Sodium: 145 mmol/L — ABNORMAL HIGH (ref 134–144)
Total Protein: 6.5 g/dL (ref 6.0–8.5)
eGFR: 90 mL/min/{1.73_m2} (ref >59)

## 2022-06-30 LAB — CBC WITH DIFFERENTIAL/PLATELET
Basophils Absolute: 0 10*3/uL (ref 0.0–0.2)
Basos: 1 %
EOS (ABSOLUTE): 0.1 10*3/uL (ref 0.0–0.4)
Eos: 1 %
Hematocrit: 39.8 % (ref 34.0–46.6)
Hemoglobin: 12.8 g/dL (ref 11.1–15.9)
Immature Grans (Abs): 0 10*3/uL (ref 0.0–0.1)
Immature Granulocytes: 0 %
Lymphocytes Absolute: 2.4 10*3/uL (ref 0.7–3.1)
Lymphs: 39 %
MCH: 28.1 pg (ref 26.6–33.0)
MCHC: 32.2 g/dL (ref 31.5–35.7)
MCV: 87 fL (ref 79–97)
Monocytes Absolute: 0.4 10*3/uL (ref 0.1–0.9)
Monocytes: 6 %
Neutrophils Absolute: 3.3 10*3/uL (ref 1.4–7.0)
Neutrophils: 53 %
Platelets: 340 10*3/uL (ref 150–450)
RBC: 4.56 x10E6/uL (ref 3.77–5.28)
RDW: 13.1 % (ref 11.7–15.4)
WBC: 6.1 10*3/uL (ref 3.4–10.8)

## 2022-06-30 LAB — LIPID PANEL W/O CHOL/HDL RATIO
Cholesterol, Total: 163 mg/dL (ref 100–199)
HDL: 68 mg/dL (ref >39)
LDL Chol Calc (NIH): 84 mg/dL (ref 0–99)
Triglycerides: 53 mg/dL (ref 0–149)
VLDL Cholesterol Cal: 11 mg/dL (ref 5–40)

## 2022-07-02 NOTE — Assessment & Plan Note (Signed)
Rechecking labs today. Await results. Treat as needed.  °

## 2022-07-02 NOTE — Assessment & Plan Note (Signed)
Under good control on current regimen. Continue current regimen. Continue to monitor. Call with any concerns. Refills given. Labs drawn today.   

## 2022-07-05 DIAGNOSIS — R0981 Nasal congestion: Secondary | ICD-10-CM | POA: Diagnosis not present

## 2022-07-05 DIAGNOSIS — H8112 Benign paroxysmal vertigo, left ear: Secondary | ICD-10-CM | POA: Diagnosis not present

## 2022-07-05 DIAGNOSIS — H8111 Benign paroxysmal vertigo, right ear: Secondary | ICD-10-CM | POA: Diagnosis not present

## 2022-07-05 DIAGNOSIS — H6123 Impacted cerumen, bilateral: Secondary | ICD-10-CM | POA: Diagnosis not present

## 2022-07-05 DIAGNOSIS — H6982 Other specified disorders of Eustachian tube, left ear: Secondary | ICD-10-CM | POA: Diagnosis not present

## 2022-07-12 DIAGNOSIS — J329 Chronic sinusitis, unspecified: Secondary | ICD-10-CM | POA: Diagnosis not present

## 2022-07-23 ENCOUNTER — Other Ambulatory Visit: Payer: Self-pay | Admitting: Family Medicine

## 2022-07-24 NOTE — Telephone Encounter (Signed)
Rx 06/29/22 #30 6RF- too soon Requested Prescriptions  Pending Prescriptions Disp Refills   esomeprazole (NEXIUM) 40 MG capsule [Pharmacy Med Name: Esomeprazole Magnesium 40 MG Oral Capsule Delayed Release] 60 capsule 0    Sig: Take 2 capsules by mouth once daily     Gastroenterology: Proton Pump Inhibitors 2 Passed - 07/23/2022  7:52 AM      Passed - ALT in normal range and within 360 days    ALT  Date Value Ref Range Status  06/29/2022 14 0 - 32 IU/L Final         Passed - AST in normal range and within 360 days    AST  Date Value Ref Range Status  06/29/2022 14 0 - 40 IU/L Final         Passed - Valid encounter within last 12 months    Recent Outpatient Visits           3 weeks ago IFG (impaired fasting glucose)   [facility], Megan P, DO   2 months ago Gastroesophageal reflux disease without esophagitis   [place] [facility], Megan P, DO   3 months ago Gastroesophageal reflux disease without esophagitis   [place], Megan P, DO   6 months ago Routine general medical examination at a health care facility   [place], [place] P, DO   1 year ago Dysfunction of left eustachian tube   [place], [place], DO       Future Appointments             In 5 months Wynetta Emery, Barb Merino, DO [place]

## 2022-10-20 ENCOUNTER — Ambulatory Visit: Payer: 59 | Admitting: Family Medicine

## 2022-10-20 VITALS — BP 116/73 | HR 79 | Temp 97.8°F | Ht 63.0 in | Wt 138.0 lb

## 2022-10-20 DIAGNOSIS — J0100 Acute maxillary sinusitis, unspecified: Secondary | ICD-10-CM

## 2022-10-20 MED ORDER — TRIAMCINOLONE ACETONIDE 40 MG/ML IJ SUSP
40.0000 mg | Freq: Once | INTRAMUSCULAR | Status: AC
Start: 2022-10-20 — End: 2022-10-23
  Administered 2022-10-23: 40 mg via INTRAMUSCULAR

## 2022-10-20 MED ORDER — AMOXICILLIN-POT CLAVULANATE 875-125 MG PO TABS: 1.0000 | ORAL_TABLET | Freq: Two times a day (BID) | ORAL | 0 refills | Status: DC

## 2022-10-20 MED ORDER — PREDNISONE 50 MG PO TABS: 50.0000 mg | ORAL_TABLET | Freq: Every day | ORAL | 0 refills | Status: DC

## 2022-10-20 NOTE — Progress Notes (Signed)
BP 116/73   Pulse 79   Temp 97.8 F (36.6 C) (Oral)   Ht 5\' 3"  (1.6 m)   Wt 138 lb (62.6 kg)   SpO2 100%   BMI 24.45 kg/m    Subjective:    Patient ID: Judy Munoz, female    DOB: 1970/09/05, 52 y.o.   MRN: [id number]  HPI: Judy Munoz is a 52 y.o. female  Chief Complaint  Patient presents with   Ear Problem    Patient says she has not felt well for the past three weeks off and on. Patient says she will feel better for a couple of days and then start feel bad again. Patient says it is starting to interfere with her sleeping and she has to propped up in a recliner in order to feel better. Patient says when she was laying down she felt as if everything was spinning in the office. Patient says she had an old prescription of Meclizine and it helped with the dizziness. She has an history of ear infections in past.   Sore Throat    Patient says she has had to use her inhaler a lot more recently as she has been coughing off and on for the last three weeks. Patient says she feels as if you would if you have a mouth ulcer. Patient denies having any fever or any other symptoms.    UPPER RESPIRATORY TRACT INFECTION Duration: 3 weeks Worst symptom:congestion, pain on the L side Fever: no Cough: yes Shortness of breath: no Wheezing: no Chest pain: no Chest tightness: no Chest congestion: no Nasal congestion: yes Runny nose: yes Post nasal drip: yes Sneezing: no Sore throat: yes Swollen glands: yes Sinus pressure: yes Headache: yes Face pain: yes Toothache: no Ear pain:  left Ear pressure: no  Eyes red/itching:no Eye drainage/crusting: no  Vomiting: no Rash: no Fatigue: yes Sick contacts: no Strep contacts: no  Context: stable Recurrent sinusitis: no Relief with OTC cold/cough medications: no  Treatments attempted: cold/sinus, mucinex, anti-histamine, pseudoephedrine, and cough syrup   Relevant past medical, surgical, family and social history reviewed and  updated as indicated. Interim medical history since our last visit reviewed. Allergies and medications reviewed and updated.  Review of Systems  Per HPI unless specifically indicated above     Objective:    BP 116/73   Pulse 79   Temp 97.8 F (36.6 C) (Oral)   Ht 5\' 3"  (1.6 m)   Wt 138 lb (62.6 kg)   SpO2 100%   BMI 24.45 kg/m   Wt Readings from Last 3 Encounters:  10/20/22 138 lb (62.6 kg)  06/29/22 137 lb 4.8 oz (62.3 kg)  05/18/22 135 lb (61.2 kg)    Physical Exam Vitals and nursing note reviewed.  Constitutional:      General: She is not in acute distress.    Appearance: Normal appearance. She is well-developed and normal weight. She is not ill-appearing, toxic-appearing or diaphoretic.  HENT:     Head: Normocephalic and atraumatic.     Right Ear: Tympanic membrane, ear canal and external ear normal. No drainage, swelling or tenderness. No middle ear effusion. Tympanic membrane is not erythematous.     Left Ear: Tympanic membrane, ear canal and external ear normal. No drainage, swelling or tenderness.  No middle ear effusion. Tympanic membrane is not erythematous.     Nose: Congestion and rhinorrhea present.     Mouth/Throat:     Mouth: Mucous membranes are moist. No oral  lesions.     Pharynx: Pharyngeal swelling present. No oropharyngeal exudate, posterior oropharyngeal erythema or uvula swelling.     Tonsils: Tonsillar exudate present.  Eyes:     General: No scleral icterus.       Right eye: No discharge.        Left eye: No discharge.     Extraocular Movements: Extraocular movements intact.     Conjunctiva/sclera: Conjunctivae normal.     Pupils: Pupils are equal, round, and reactive to light.  Cardiovascular:     Rate and Rhythm: Normal rate and regular rhythm.     Pulses: Normal pulses.     Heart sounds: Normal heart sounds. No murmur heard.    No friction rub. No gallop.  Pulmonary:     Effort: Pulmonary effort is normal. No respiratory distress.      Breath sounds: Normal breath sounds. No stridor. No wheezing, rhonchi or rales.  Chest:     Chest wall: No tenderness.  Musculoskeletal:        General: Normal range of motion.     Cervical back: Normal range of motion and neck supple.  Lymphadenopathy:     Cervical: Cervical adenopathy present.  Skin:    General: Skin is warm and dry.     Capillary Refill: Capillary refill takes less than 2 seconds.     Coloration: Skin is not jaundiced or pale.     Findings: No bruising, erythema, lesion or rash.  Neurological:     General: No focal deficit present.     Mental Status: She is alert and oriented to person, place, and time. Mental status is at baseline.  Psychiatric:        Mood and Affect: Mood normal.        Behavior: Behavior normal.        Thought Content: Thought content normal.        Judgment: Judgment normal.     Results for orders placed or performed in visit on 06/29/22  VITAMIN D 25 Hydroxy (Vit-D Deficiency, Fractures)  Result Value Ref Range   Vit D, 25-Hydroxy 35.7 30.0 - 100.0 ng/mL  Comprehensive metabolic panel  Result Value Ref Range   Glucose 97 70 - 99 mg/dL   BUN 12 6 - 24 mg/dL   Creatinine, Ser 4.09 0.57 - 1.00 mg/dL   eGFR 90 >81 XB/JYN/8.29   BUN/Creatinine Ratio 15 9 - 23   Sodium 145 (H) 134 - 144 mmol/L   Potassium 4.2 3.5 - 5.2 mmol/L   Chloride 109 (H) 96 - 106 mmol/L   CO2 23 20 - 29 mmol/L   Calcium 9.6 8.7 - 10.2 mg/dL   Total Protein 6.5 6.0 - 8.5 g/dL   Albumin 4.6 3.9 - 4.9 g/dL   Globulin, Total 1.9 1.5 - 4.5 g/dL   Albumin/Globulin Ratio 2.4 (H) 1.2 - 2.2   Bilirubin Total 0.2 0.0 - 1.2 mg/dL   Alkaline Phosphatase 67 44 - 121 IU/L   AST 14 0 - 40 IU/L   ALT 14 0 - 32 IU/L  CBC with Differential/Platelet  Result Value Ref Range   WBC 6.1 3.4 - 10.8 x10E3/uL   RBC 4.56 3.77 - 5.28 x10E6/uL   Hemoglobin 12.8 11.1 - 15.9 g/dL   Hematocrit 56.2 13.0 - 46.6 %   MCV 87 79 - 97 fL   MCH 28.1 26.6 - 33.0 pg   MCHC 32.2 31.5 - 35.7  g/dL   RDW 86.5 78.4 - 69.6 %  Platelets 340 150 - 450 x10E3/uL   Neutrophils 53 Not Estab. %   Lymphs 39 Not Estab. %   Monocytes 6 Not Estab. %   Eos 1 Not Estab. %   Basos 1 Not Estab. %   Neutrophils Absolute 3.3 1.4 - 7.0 x10E3/uL   Lymphocytes Absolute 2.4 0.7 - 3.1 x10E3/uL   Monocytes Absolute 0.4 0.1 - 0.9 x10E3/uL   EOS (ABSOLUTE) 0.1 0.0 - 0.4 x10E3/uL   Basophils Absolute 0.0 0.0 - 0.2 x10E3/uL   Immature Granulocytes 0 Not Estab. %   Immature Grans (Abs) 0.0 0.0 - 0.1 x10E3/uL  Lipid Panel w/o Chol/HDL Ratio  Result Value Ref Range   Cholesterol, Total 163 100 - 199 mg/dL   Triglycerides 53 0 - 149 mg/dL   HDL 68 >91 mg/dL   VLDL Cholesterol Cal 11 5 - 40 mg/dL   LDL Chol Calc (NIH) 84 0 - 99 mg/dL  Bayer DCA Hb Y7W Waived  Result Value Ref Range   HB A1C (BAYER DCA - WAIVED) 6.1 (H) 4.8 - 5.6 %      Assessment & Plan:   Problem List Items Addressed This Visit   None Visit Diagnoses     Acute non-recurrent maxillary sinusitis    -  Primary   Will treat with steroids and augmentin. Call with any concerns or if not getting better. Continue to monitor.   Relevant Medications   predniSONE (DELTASONE) 50 MG tablet   amoxicillin-clavulanate (AUGMENTIN) 875-125 MG tablet   triamcinolone acetonide (KENALOG-40) injection 40 mg (Start on 10/20/2022  2:15 PM)        Follow up plan: Return if symptoms worsen or fail to improve.

## 2022-10-23 DIAGNOSIS — J0100 Acute maxillary sinusitis, unspecified: Secondary | ICD-10-CM | POA: Diagnosis not present

## 2022-10-25 ENCOUNTER — Encounter: Payer: Self-pay | Admitting: Family Medicine

## 2022-10-26 MED ORDER — FLUCONAZOLE 150 MG PO TABS: 150.0000 mg | ORAL_TABLET | Freq: Once | ORAL | 0 refills | Status: AC

## 2022-10-31 ENCOUNTER — Ambulatory Visit: Payer: Self-pay | Admitting: *Deleted

## 2022-10-31 DIAGNOSIS — K2960 Other gastritis without bleeding: Secondary | ICD-10-CM | POA: Diagnosis not present

## 2022-10-31 DIAGNOSIS — T50995A Adverse effect of other drugs, medicaments and biological substances, initial encounter: Secondary | ICD-10-CM | POA: Diagnosis not present

## 2022-10-31 DIAGNOSIS — E876 Hypokalemia: Secondary | ICD-10-CM | POA: Diagnosis not present

## 2022-10-31 DIAGNOSIS — R1013 Epigastric pain: Secondary | ICD-10-CM | POA: Diagnosis not present

## 2022-10-31 DIAGNOSIS — Z882 Allergy status to sulfonamides status: Secondary | ICD-10-CM | POA: Diagnosis not present

## 2022-10-31 DIAGNOSIS — R109 Unspecified abdominal pain: Secondary | ICD-10-CM | POA: Diagnosis not present

## 2022-10-31 DIAGNOSIS — R197 Diarrhea, unspecified: Secondary | ICD-10-CM | POA: Diagnosis not present

## 2022-10-31 DIAGNOSIS — K769 Liver disease, unspecified: Secondary | ICD-10-CM | POA: Diagnosis not present

## 2022-10-31 DIAGNOSIS — E860 Dehydration: Secondary | ICD-10-CM | POA: Diagnosis not present

## 2022-10-31 DIAGNOSIS — K7689 Other specified diseases of liver: Secondary | ICD-10-CM | POA: Diagnosis not present

## 2022-10-31 DIAGNOSIS — R748 Abnormal levels of other serum enzymes: Secondary | ICD-10-CM | POA: Diagnosis not present

## 2022-10-31 DIAGNOSIS — E7800 Pure hypercholesterolemia, unspecified: Secondary | ICD-10-CM | POA: Diagnosis not present

## 2022-10-31 DIAGNOSIS — K859 Acute pancreatitis without necrosis or infection, unspecified: Secondary | ICD-10-CM | POA: Diagnosis not present

## 2022-10-31 DIAGNOSIS — R112 Nausea with vomiting, unspecified: Secondary | ICD-10-CM | POA: Diagnosis not present

## 2022-10-31 NOTE — Telephone Encounter (Signed)
She has been off the augmentin for several days. I'm not sure what caused the pancreatitis, but if she is not feeling better I do advise her to to go the ER

## 2022-10-31 NOTE — Telephone Encounter (Signed)
Summary: throwing up/can't keep food down   Patients husband Harrold Donath has called in stating him and patient (his wife) are traveling in [place] right now, and last night she was seen in the ED, for hurting right under bra area to back & they did CT scan and stated she has pancreatitis. ED gave narcotic and zofran, patient took zofran about 9am this morning but has not taken narcotic. She was also given 2 things of morphine in the ED. And also given liquid zofran but has not taken that. Patient is currently throwing up and can not keep food or drink down. Patient also stated she feels like a migraine is coming on. She is questioning if she can take ibuprofen. She had diarrhea before she went to the ED   Patients callback #: [phone] (nathan's number / husband)         Attempted to call contact number provided- no answer- left message to call office. Also called patient at cell number in chart- left message to call office

## 2022-10-31 NOTE — Telephone Encounter (Signed)
     Chief Complaint: Pt. In [place], seen in ED last night with "mild pancreatitis" per husband "from Augmentin she was on." Has taking Zofran under tongue this morning, has not helped vomiting. Will try liquid Zofran now. Instructed to return to ED if no relief obtained. Symptoms: Vomiting, voiding well per husband. Frequency: Last night Pertinent Negatives: Patient denies  Disposition: [x] ED /[] Urgent Care (no appt availability in office) / [] Appointment(In office/virtual)/ []  [facility]/ [] Home Care/ [] Refused Recommended Disposition /[] [facility]/ []  Follow-up with PCP Additional Notes: Will return to ED if no better. Wants PCP aware and her advice.  Reason for Disposition  [1] Caller has URGENT medicine question about med that PCP or specialist prescribed AND [2] triager unable to answer question  Answer Assessment - Initial Assessment Questions 1. NAME of MEDICINE: "What medicine(s) are you calling about?"     Zofran 2. QUESTION: "What is your question?" (e.g., double dose of medicine, side effect)     Can she take the liquid 3. PRESCRIBER: "Who prescribed the medicine?" Reason: if prescribed by specialist, call should be referred to that group.     ED 4. SYMPTOMS: "Do you have any symptoms?" If Yes, ask: "What symptoms are you having?"  "How bad are the symptoms (e.g., mild, moderate, severe)     Vomiting 5. PREGNANCY:  "Is there any chance that you are pregnant?" "When was your last menstrual period?"     No  Protocols used: Medication Question Call-A-AH

## 2022-11-01 ENCOUNTER — Encounter: Payer: Self-pay | Admitting: Family Medicine

## 2022-11-01 DIAGNOSIS — K859 Acute pancreatitis without necrosis or infection, unspecified: Secondary | ICD-10-CM | POA: Diagnosis not present

## 2022-11-01 DIAGNOSIS — E860 Dehydration: Secondary | ICD-10-CM | POA: Diagnosis not present

## 2022-11-01 DIAGNOSIS — E876 Hypokalemia: Secondary | ICD-10-CM | POA: Diagnosis not present

## 2022-11-01 DIAGNOSIS — K769 Liver disease, unspecified: Secondary | ICD-10-CM | POA: Diagnosis not present

## 2022-11-01 DIAGNOSIS — E7800 Pure hypercholesterolemia, unspecified: Secondary | ICD-10-CM | POA: Diagnosis not present

## 2022-11-01 DIAGNOSIS — T50995A Adverse effect of other drugs, medicaments and biological substances, initial encounter: Secondary | ICD-10-CM | POA: Diagnosis not present

## 2022-11-01 DIAGNOSIS — K2960 Other gastritis without bleeding: Secondary | ICD-10-CM | POA: Diagnosis not present

## 2022-11-01 NOTE — Telephone Encounter (Signed)
Called and spoke with pt. Pt states that she is currently admitted into the hospital, States that her liver enzymes are elevated.

## 2022-11-02 DIAGNOSIS — E7800 Pure hypercholesterolemia, unspecified: Secondary | ICD-10-CM | POA: Diagnosis not present

## 2022-11-02 DIAGNOSIS — T50995A Adverse effect of other drugs, medicaments and biological substances, initial encounter: Secondary | ICD-10-CM | POA: Diagnosis not present

## 2022-11-02 DIAGNOSIS — K2960 Other gastritis without bleeding: Secondary | ICD-10-CM | POA: Diagnosis not present

## 2022-11-02 DIAGNOSIS — R748 Abnormal levels of other serum enzymes: Secondary | ICD-10-CM | POA: Diagnosis not present

## 2022-11-02 DIAGNOSIS — E876 Hypokalemia: Secondary | ICD-10-CM | POA: Diagnosis not present

## 2022-11-02 DIAGNOSIS — R109 Unspecified abdominal pain: Secondary | ICD-10-CM | POA: Diagnosis not present

## 2022-11-02 DIAGNOSIS — E860 Dehydration: Secondary | ICD-10-CM | POA: Diagnosis not present

## 2022-11-02 DIAGNOSIS — K769 Liver disease, unspecified: Secondary | ICD-10-CM | POA: Diagnosis not present

## 2022-11-02 DIAGNOSIS — K859 Acute pancreatitis without necrosis or infection, unspecified: Secondary | ICD-10-CM | POA: Diagnosis not present

## 2022-11-06 ENCOUNTER — Telehealth: Payer: Self-pay | Admitting: Family Medicine

## 2022-11-06 NOTE — Telephone Encounter (Unsigned)
Copied from CRM [id number]. Topic: Appointment Scheduling - Scheduling Inquiry for Clinic >> Nov 06, 2022  8:10 AM Carrielelia G wrote: Reason for CRM:  Patient Judy Munoz is in need of a hospital follow up with pcp, first available is not until August PLease advise 

## 2022-11-06 NOTE — Telephone Encounter (Signed)
Called patient to schedule HFU stated that provider stated she wanted to see her.  Only feels comfortable seeing her provider, however with no openings wasn't where to put her on the schedule.  Please advise.

## 2022-11-06 NOTE — Telephone Encounter (Signed)
Copied from CRM [id number]. Topic: Appointment Scheduling - Scheduling Inquiry for Clinic >> Nov 06, 2022  8:10 AM Geoffry Paradise G wrote: Reason for CRM:  Patient Judy Munoz is in need of a hospital follow up with pcp, first available is not until August PLease advise

## 2022-11-07 NOTE — Telephone Encounter (Signed)
Called and scheduled patient with Prescott Gum on 11/09/2022 @ 3:20 pm.

## 2022-11-07 NOTE — Telephone Encounter (Signed)
She will need to see someone else, I will see her in August also, but needs to be seen before that

## 2022-11-09 ENCOUNTER — Ambulatory Visit: Payer: 59 | Admitting: Family Medicine

## 2022-11-09 ENCOUNTER — Encounter: Payer: Self-pay | Admitting: Family Medicine

## 2022-11-09 VITALS — BP 116/77 | HR 63 | Temp 97.9°F | Wt 130.4 lb

## 2022-11-09 DIAGNOSIS — R748 Abnormal levels of other serum enzymes: Secondary | ICD-10-CM | POA: Diagnosis not present

## 2022-11-09 NOTE — Patient Instructions (Addendum)
The BRAT (bananas, rice, apples, toast) diet is often recommended for patients suffering from diarrhea or the stomach flu. In addition to these foods, you may also consume other mild foods that ease the GI tract such as saltines, oatmeal, or boiled potatoes.   The BRAT diet focuses on abstaining from foods immediately following symptoms that are sugary, high in fat, spicy, or contain dairy. This includes citrus fruits, raw vegetables, fried foods, caffeinated beverages, and alcohol

## 2022-11-09 NOTE — Progress Notes (Signed)
BP 116/77   Pulse 63   Temp 97.9 F (36.6 C) (Oral)   Wt 130 lb 6.4 oz (59.1 kg)   SpO2 100%   BMI 23.10 kg/m    Subjective:    Patient ID: Judy Munoz, female    DOB: [birth year], 52 y.o.   MRN: [id number]  HPI: Judy Munoz is a 52 y.o. female  Chief Complaint  Patient presents with   Pancreatitis    Pt states the pain has got better but still unable to eat much.   She previously completed Augmentin and Prednisone (3 days/5). Developed diarrhea, right upper quadrant pain, and back pain where she was seen in the emergency dept on 10/31/22, and diagnosed with pancreatitis. At ED visit she was treated with zofran and fluids, pt discharged.  At home started vomiting non stop with green bile, zofran was not helping. She returned to the ED and was admitted and treated for Acute liver disease/ elevated liver enzymes. She was treated with IV fluids and recommended to hold Pravastatin until PCP follow up. According to hospital note Lipase 863, amylase 599, ALT 933, AST 772. MRI MRCP obtained with impression of no significant abnormality of the liver. No acute findings elsewhere in the abdomen.   ABDOMINAL PAIN  Not like before, every once in a while, after eating has to use the bathroom, only thing she is eating is grilled chicken and rice. She remains nauseous. Just started to eat two days ago. She has history of cholecystectomy 4-5 years ago.  Duration: 9 days Onset: sudden Severity: 2/10 Quality: stabbing Location:  RUQ  Episode duration:  Radiation: yes to back Frequency: intermittent at random times Alleviating factors: Heating pad Aggravating factors: None Status: better Treatments attempted:  None  Fever: no Nausea: yes Vomiting: no since discharged from hospital  Weight loss: yes Decreased appetite: yes but feels hungry Diarrhea: yes only today, orange color Constipation: no Blood in stool: no Heartburn: no Jaundice: no Rash: no Dysuria/urinary frequency:  no Hematuria: no Recurrent NSAID use: no   Relevant past medical, surgical, family and social history reviewed and updated as indicated. Interim medical history since our last visit reviewed. Allergies and medications reviewed and updated.  Review of Systems  Constitutional:  Negative for fever.  Respiratory: Negative.    Cardiovascular: Negative.   Gastrointestinal:  Positive for abdominal pain and diarrhea. Negative for blood in stool, constipation, nausea and vomiting.    Per HPI unless specifically indicated above     Objective:    BP 116/77   Pulse 63   Temp 97.9 F (36.6 C) (Oral)   Wt 130 lb 6.4 oz (59.1 kg)   SpO2 100%   BMI 23.10 kg/m   Wt Readings from Last 3 Encounters:  11/09/22 130 lb 6.4 oz (59.1 kg)  10/20/22 138 lb (62.6 kg)  06/29/22 137 lb 4.8 oz (62.3 kg)    Physical Exam Vitals and nursing note reviewed.  Constitutional:      General: She is awake. She is not in acute distress.    Appearance: Normal appearance. She is well-developed and well-groomed. She is not ill-appearing.  HENT:     Head: Normocephalic and atraumatic.     Right Ear: Hearing and external ear normal. No drainage.     Left Ear: Hearing and external ear normal. No drainage.     Nose: Nose normal.  Eyes:     General: Lids are normal.        Right eye:  No discharge.        Left eye: No discharge.     Conjunctiva/sclera: Conjunctivae normal.  Cardiovascular:     Rate and Rhythm: Normal rate and regular rhythm.     Heart sounds: Normal heart sounds, S1 normal and S2 normal. No murmur heard.    No gallop.  Pulmonary:     Effort: Pulmonary effort is normal. No accessory muscle usage or respiratory distress.     Breath sounds: Normal breath sounds.  Abdominal:     General: Bowel sounds are normal.     Tenderness: There is no abdominal tenderness.  Musculoskeletal:        General: Normal range of motion.     Cervical back: Full passive range of motion without pain and normal  range of motion.     Right lower leg: No edema.     Left lower leg: No edema.  Skin:    General: Skin is warm and dry.     Capillary Refill: Capillary refill takes less than 2 seconds.  Neurological:     Mental Status: She is alert and oriented to person, place, and time.  Psychiatric:        Attention and Perception: Attention normal.        Mood and Affect: Mood normal.        Speech: Speech normal.        Behavior: Behavior normal. Behavior is cooperative.        Thought Content: Thought content normal.     Results for orders placed or performed in visit on 11/09/22  Comp Met (CMET)  Result Value Ref Range   Glucose 109 (H) 70 - 99 mg/dL   BUN 9 6 - 24 mg/dL   Creatinine, Ser 7.06 0.57 - 1.00 mg/dL   eGFR 92 >23 JS/EGB/1.51   BUN/Creatinine Ratio 12 9 - 23   Sodium 142 134 - 144 mmol/L   Potassium 3.7 3.5 - 5.2 mmol/L   Chloride 105 96 - 106 mmol/L   CO2 21 20 - 29 mmol/L   Calcium 9.4 8.7 - 10.2 mg/dL   Total Protein 6.8 6.0 - 8.5 g/dL   Albumin 4.7 3.8 - 4.9 g/dL   Globulin, Total 2.1 1.5 - 4.5 g/dL   Bilirubin Total 0.2 0.0 - 1.2 mg/dL   Alkaline Phosphatase 98 44 - 121 IU/L   AST 15 0 - 40 IU/L   ALT 59 (H) 0 - 32 IU/L  Amylase  Result Value Ref Range   Amylase 86 31 - 110 U/L  Lipase  Result Value Ref Range   Lipase 44 14 - 72 U/L  CBC w/Diff  Result Value Ref Range   WBC 7.2 3.4 - 10.8 x10E3/uL   RBC 4.73 3.77 - 5.28 x10E6/uL   Hemoglobin 13.5 11.1 - 15.9 g/dL   Hematocrit 76.1 60.7 - 46.6 %   MCV 85 79 - 97 fL   MCH 28.5 26.6 - 33.0 pg   MCHC 33.5 31.5 - 35.7 g/dL   RDW 37.1 06.2 - 69.4 %   Platelets 336 150 - 450 x10E3/uL   Neutrophils 56 Not Estab. %   Lymphs 36 Not Estab. %   Monocytes 6 Not Estab. %   Eos 1 Not Estab. %   Basos 1 Not Estab. %   Neutrophils Absolute 4.1 1.4 - 7.0 x10E3/uL   Lymphocytes Absolute 2.6 0.7 - 3.1 x10E3/uL   Monocytes Absolute 0.4 0.1 - 0.9 x10E3/uL   EOS (ABSOLUTE) 0.1 0.0 -  0.4 x10E3/uL   Basophils Absolute 0.0  0.0 - 0.2 x10E3/uL   Immature Granulocytes 0 Not Estab. %   Immature Grans (Abs) 0.0 0.0 - 0.1 x10E3/uL      Assessment & Plan:   Problem List Items Addressed This Visit     Elevated liver enzymes - Primary    Acute, ongoing. CBC, CMP, amylase, and lipase done today. Will recheck liver enzymes to determine if levels remain elevated. If RUQ abdominal pain worsens will consider [place] of liver. Recommend BRAT diet for 5-7 days until able to tolerate advancing diet.       Relevant Orders   Comp Met (CMET) (Completed)   Amylase (Completed)   Lipase (Completed)   CBC w/Diff (Completed)     Follow up plan: Return in about 7 weeks (around 12/28/2022), or if symptoms worsen or fail to improve.

## 2022-11-10 LAB — COMPREHENSIVE METABOLIC PANEL
ALT: 59 IU/L — ABNORMAL HIGH (ref 0–32)
AST: 15 IU/L (ref 0–40)
Albumin: 4.7 g/dL (ref 3.8–4.9)
Alkaline Phosphatase: 98 IU/L (ref 44–121)
BUN/Creatinine Ratio: 12 (ref 9–23)
BUN: 9 mg/dL (ref 6–24)
Bilirubin Total: 0.2 mg/dL (ref 0.0–1.2)
CO2: 21 mmol/L (ref 20–29)
Calcium: 9.4 mg/dL (ref 8.7–10.2)
Chloride: 105 mmol/L (ref 96–106)
Creatinine, Ser: 0.78 mg/dL (ref 0.57–1.00)
Globulin, Total: 2.1 g/dL (ref 1.5–4.5)
Glucose: 109 mg/dL — ABNORMAL HIGH (ref 70–99)
Potassium: 3.7 mmol/L (ref 3.5–5.2)
Sodium: 142 mmol/L (ref 134–144)
Total Protein: 6.8 g/dL (ref 6.0–8.5)
eGFR: 92 mL/min/{1.73_m2} (ref >59)

## 2022-11-10 LAB — CBC WITH DIFFERENTIAL/PLATELET
Basophils Absolute: 0 10*3/uL (ref 0.0–0.2)
Basos: 1 %
EOS (ABSOLUTE): 0.1 10*3/uL (ref 0.0–0.4)
Eos: 1 %
Hematocrit: 40.3 % (ref 34.0–46.6)
Hemoglobin: 13.5 g/dL (ref 11.1–15.9)
Immature Grans (Abs): 0 10*3/uL (ref 0.0–0.1)
Immature Granulocytes: 0 %
Lymphocytes Absolute: 2.6 10*3/uL (ref 0.7–3.1)
Lymphs: 36 %
MCH: 28.5 pg (ref 26.6–33.0)
MCHC: 33.5 g/dL (ref 31.5–35.7)
MCV: 85 fL (ref 79–97)
Monocytes Absolute: 0.4 10*3/uL (ref 0.1–0.9)
Monocytes: 6 %
Neutrophils Absolute: 4.1 10*3/uL (ref 1.4–7.0)
Neutrophils: 56 %
Platelets: 336 10*3/uL (ref 150–450)
RBC: 4.73 x10E6/uL (ref 3.77–5.28)
RDW: 13.5 % (ref 11.7–15.4)
WBC: 7.2 10*3/uL (ref 3.4–10.8)

## 2022-11-10 LAB — AMYLASE: Amylase: 86 U/L (ref 31–110)

## 2022-11-10 LAB — LIPASE: Lipase: 44 U/L (ref 14–72)

## 2022-11-12 DIAGNOSIS — R748 Abnormal levels of other serum enzymes: Secondary | ICD-10-CM | POA: Insufficient documentation

## 2022-11-12 NOTE — Assessment & Plan Note (Signed)
Acute, ongoing. CBC, CMP, amylase, and lipase done today. Will recheck liver enzymes to determine if levels remain elevated. If RUQ abdominal pain worsens will consider [place] of liver. Recommend BRAT diet for 5-7 days until able to tolerate advancing diet.

## 2022-11-29 ENCOUNTER — Encounter: Payer: Self-pay | Admitting: Family Medicine

## 2022-12-15 ENCOUNTER — Encounter: Payer: Self-pay | Admitting: Family Medicine

## 2022-12-28 ENCOUNTER — Encounter: Payer: 59 | Admitting: Family Medicine

## 2023-01-03 ENCOUNTER — Encounter: Payer: 59 | Admitting: Family Medicine

## 2023-01-10 ENCOUNTER — Ambulatory Visit (INDEPENDENT_AMBULATORY_CARE_PROVIDER_SITE_OTHER): Payer: 59

## 2023-01-10 DIAGNOSIS — Z23 Encounter for immunization: Secondary | ICD-10-CM | POA: Diagnosis not present

## 2023-02-07 ENCOUNTER — Encounter: Payer: Self-pay | Admitting: Family Medicine

## 2023-02-07 ENCOUNTER — Ambulatory Visit (INDEPENDENT_AMBULATORY_CARE_PROVIDER_SITE_OTHER): Payer: 59 | Admitting: Family Medicine

## 2023-02-07 VITALS — BP 105/68 | HR 65 | Ht 63.0 in | Wt 135.2 lb

## 2023-02-07 DIAGNOSIS — G43009 Migraine without aura, not intractable, without status migrainosus: Secondary | ICD-10-CM | POA: Diagnosis not present

## 2023-02-07 DIAGNOSIS — Z0000 Encounter for general adult medical examination without abnormal findings: Secondary | ICD-10-CM | POA: Diagnosis not present

## 2023-02-07 DIAGNOSIS — E559 Vitamin D deficiency, unspecified: Secondary | ICD-10-CM | POA: Diagnosis not present

## 2023-02-07 DIAGNOSIS — E7800 Pure hypercholesterolemia, unspecified: Secondary | ICD-10-CM | POA: Diagnosis not present

## 2023-02-07 DIAGNOSIS — R7301 Impaired fasting glucose: Secondary | ICD-10-CM

## 2023-02-07 DIAGNOSIS — Z1231 Encounter for screening mammogram for malignant neoplasm of breast: Secondary | ICD-10-CM

## 2023-02-07 MED ORDER — NORTRIPTYLINE HCL 10 MG PO CAPS: 10.0000 mg | ORAL_CAPSULE | Freq: Every day | ORAL | 6 refills | Status: DC

## 2023-02-07 MED ORDER — ALBUTEROL SULFATE HFA 108 (90 BASE) MCG/ACT IN AERS: INHALATION_SPRAY | RESPIRATORY_TRACT | 6 refills | Status: DC

## 2023-02-07 MED ORDER — FLUTICASONE PROPIONATE 50 MCG/ACT NA SUSP: 2.0000 | Freq: Every day | NASAL | 12 refills | Status: DC

## 2023-02-07 MED ORDER — TOPIRAMATE 25 MG PO TABS: 25.0000 mg | ORAL_TABLET | Freq: Two times a day (BID) | ORAL | 6 refills | Status: DC

## 2023-02-07 MED ORDER — IBUPROFEN 800 MG PO TABS: 800.0000 mg | ORAL_TABLET | Freq: Three times a day (TID) | ORAL | 6 refills | Status: DC | PRN

## 2023-02-07 MED ORDER — ONDANSETRON HCL 8 MG PO TABS: 8.0000 mg | ORAL_TABLET | Freq: Three times a day (TID) | ORAL | 3 refills | Status: DC | PRN

## 2023-02-07 MED ORDER — PRAVASTATIN SODIUM 20 MG PO TABS: ORAL_TABLET | ORAL | 6 refills | Status: DC

## 2023-02-07 MED ORDER — ESOMEPRAZOLE MAGNESIUM 40 MG PO CPDR: 40.0000 mg | DELAYED_RELEASE_CAPSULE | Freq: Every day | ORAL | 6 refills | Status: DC

## 2023-02-07 NOTE — Progress Notes (Signed)
BP 105/68   Pulse 65   Ht 5\' 3"  (1.6 m)   Wt 135 lb 3.2 oz (61.3 kg)   SpO2 100%   BMI 23.95 kg/m    Subjective:    Patient ID: Judy Munoz, female    DOB: 09-28-1970, 52 y.o.   MRN: [id number]  HPI: Judy Munoz is a 52 y.o. female presenting on 02/07/2023 for comprehensive medical examination. Current medical complaints include:  HYPERLIPIDEMIA Hyperlipidemia status: excellent compliance Satisfied with current treatment?  yes Side effects:  no Medication compliance: excellent compliance Past cholesterol meds: pravastatin Supplements: one Aspirin:  no The 10-year ASCVD risk score (Arnett DK, et al., 2019) is: 0.5%   Values used to calculate the score:     Age: 33 years     Sex: Female     Is Non-Hispanic African American: No     Diabetic: No     Tobacco smoker: No     Systolic Blood Pressure: 105 mmHg     Is BP treated: No     HDL Cholesterol: 72 mg/dL     Total Cholesterol: 167 mg/dL Chest pain:  no Coronary artery disease:  no  Impaired Fasting Glucose HbA1C:  Lab Results  Component Value Date   HGBA1C 6.0 (H) 02/07/2023   Duration of elevated blood sugar: chronic Polydipsia: no Polyuria: no Weight change: no Visual disturbance: no Glucose Monitoring: no Diabetic Education: Not Completed Family history of diabetes: no  Has had 2 migraines in the past month with the fall allergies. She notes that she is feeling better. No other concerns or complaints at this time.   She currently lives with: husband and daughter  Menopausal Symptoms: no  Depression Screen done today and results listed below:     02/07/2023    9:43 AM 11/09/2022    3:34 PM 06/29/2022    8:32 AM 04/07/2022   11:12 AM 06/09/2021   10:43 AM  Depression screen PHQ 2/9  Decreased Interest 0 0 0 0 0  Down, Depressed, Hopeless 0 0 0 0 0  PHQ - 2 Score 0 0 0 0 0  Altered sleeping 0 0 0 0 0  Tired, decreased energy 0 3 0 1 0  Change in appetite 0 3 0 0 0  Feeling bad or failure  about yourself  0 0 0 0 0  Trouble concentrating 0 0 0 0 0  Moving slowly or fidgety/restless 0 0 0 0 0  Suicidal thoughts 0 0 0 0 0  PHQ-9 Score 0 6 0 1 0  Difficult doing work/chores Not difficult at all Somewhat difficult Not difficult at all Not difficult at all     Past Medical History:  Past Medical History:  Diagnosis Date   Allergy Seasonal   Anemia    H/O WITH PREGNANCY   Asthma    Biliary dyskinesia    Elevated glucose    GERD (gastroesophageal reflux disease)    Headache    MIGRAINES RARE   Heart murmur    WAS TOLD THIS ONCE YEARS AGO-HAS NEVER BEEN TOLD SINCE   High cholesterol    History of uterine fibroid    IFG (impaired fasting glucose)    PONV (postoperative nausea and vomiting)    HAPPENED WITH HER 1ST EGD- HER 2ND EGD SHE WAS GIVEN ANTI-EMETIC IV AND HAD NO N/V   Screening for colon cancer 05/18/2022   Trapezius muscle strain    Vitamin D deficiency     Surgical History:  Past Surgical History:  Procedure Laterality Date   CHOLECYSTECTOMY N/A 07/18/2016   Procedure: LAPAROSCOPIC CHOLECYSTECTOMY;  Surgeon: Leafy Ro, MD;  Location: [facility];  Service: General;  Laterality: N/A;   CHOLECYSTECTOMY, LAPAROSCOPIC  07/18/2016   COLONOSCOPY WITH PROPOFOL N/A 05/18/2022   Procedure: COLONOSCOPY WITH PROPOFOL;  Surgeon: Toney Reil, MD;  Location: [facility];  Service: Endoscopy;  Laterality: N/A;   ESOPHAGOGASTRODUODENOSCOPY     ESOPHAGOGASTRODUODENOSCOPY (EGD) WITH PROPOFOL N/A 07/26/2015   Procedure: ESOPHAGOGASTRODUODENOSCOPY (EGD) WITH PROPOFOL;  Surgeon: Midge Minium, MD;  Location: [facility];  Service: Endoscopy;  Laterality: N/A;   ESOPHAGOGASTRODUODENOSCOPY (EGD) WITH PROPOFOL N/A 05/18/2022   Procedure: ESOPHAGOGASTRODUODENOSCOPY (EGD) WITH PROPOFOL;  Surgeon: Toney Reil, MD;  Location: [facility];  Service: Endoscopy;  Laterality: N/A;   POLYPECTOMY  05/18/2022   Procedure: POLYPECTOMY;  Surgeon: Toney Reil, MD;  Location: [facility];  Service: Endoscopy;;    Medications:  Current Outpatient Medications on File Prior to Visit  Medication Sig   cetirizine (ZYRTEC) 10 MG tablet Take 10 mg by mouth daily.   cholecalciferol (VITAMIN D3) 25 MCG (1000 UNIT) tablet    levonorgestrel (MIRENA) 20 MCG/24HR IUD by Intrauterine route once.    No current facility-administered medications on file prior to visit.    Allergies:  Allergies  Allergen Reactions   Sulfa Antibiotics Hives    Social History:  Social History   Socioeconomic History   Marital status: Married    Spouse name: Not on file   Number of children: Not on file   Years of education: Not on file   Highest education level: Associate degree: occupational, Scientist, product/process development, or vocational program  Occupational History   Not on file  Tobacco Use   Smoking status: Never   Smokeless tobacco: Never  Vaping Use   Vaping status: Never Used  Substance and Sexual Activity   Alcohol use: Never   Drug use: Never   Sexual activity: Yes    Birth control/protection: I.U.D.  Other Topics Concern   Not on file  Social History Narrative   Not on file   Social Determinants of Health   Financial Resource Strain: Low Risk  (10/20/2022)   Overall Financial Resource Strain (CARDIA)    Difficulty of Paying Living Expenses: Not hard at all  Food Insecurity: No Food Insecurity (10/20/2022)   Hunger Vital Sign    Worried About Running Out of Food in the Last Year: Never true    Ran Out of Food in the Last Year: Never true  Transportation Needs: No Transportation Needs (10/20/2022)   PRAPARE - Administrator, Civil Service (Medical): No    Lack of Transportation (Non-Medical): No  Physical Activity: Unknown (10/20/2022)   Exercise Vital Sign    Days of Exercise per Week: Patient declined    Minutes of Exercise per Session: Not on file  Stress: No Stress Concern Present (10/20/2022)   [employer] of  Occupational Health - Occupational Stress Questionnaire    Feeling of Stress : Not at all  Social Connections: Socially Integrated (10/20/2022)   Social Connection and Isolation Panel [NHANES]    Frequency of Communication with Friends and Family: More than three times a week    Frequency of Social Gatherings with Friends and Family: More than three times a week    Attends Religious Services: More than 4 times per year    Active Member of Clubs or Organizations: Yes    Attends  Club or Organization Meetings: More than 4 times per year    Marital Status: Married  Catering manager Violence: Unknown (03/13/2017)   Humiliation, Afraid, Rape, and Kick questionnaire    Fear of Current or Ex-Partner: Patient declined    Emotionally Abused: Patient declined    Physically Abused: Patient declined    Sexually Abused: Patient declined   Social History   Tobacco Use  Smoking Status Never  Smokeless Tobacco Never   Social History   Substance and Sexual Activity  Alcohol Use Never    Family History:  Family History  Problem Relation Age of Onset   Thyroid disease Mother    Diabetes Father    Heart disease Father    Hypertension Father    Hyperlipidemia Father    Lung cancer Maternal Uncle    Cancer Maternal Grandmother        uterine   Thyroid disease Maternal Grandmother    Esophageal cancer Maternal Grandmother    Breast cancer Neg Hx     Past medical history, surgical history, medications, allergies, family history and social history reviewed with patient today and changes made to appropriate areas of the chart.   Review of Systems  Constitutional: Negative.   HENT: Negative.    Eyes: Negative.   Respiratory: Negative.    Cardiovascular: Negative.   Gastrointestinal:  Positive for abdominal pain and diarrhea. Negative for blood in stool, constipation, heartburn, melena, nausea and vomiting.  Genitourinary: Negative.   Musculoskeletal: Negative.   Skin:  Positive for itching  (on her arms). Negative for rash.  Neurological:  Positive for headaches. Negative for dizziness, tingling, tremors, sensory change, speech change, focal weakness, seizures, loss of consciousness and weakness.  Endo/Heme/Allergies:  Positive for environmental allergies. Negative for polydipsia. Does not bruise/bleed easily.  Psychiatric/Behavioral: Negative.     All other ROS negative except what is listed above and in the HPI.      Objective:    BP 105/68   Pulse 65   Ht 5\' 3"  (1.6 m)   Wt 135 lb 3.2 oz (61.3 kg)   SpO2 100%   BMI 23.95 kg/m   Wt Readings from Last 3 Encounters:  02/07/23 135 lb 3.2 oz (61.3 kg)  11/09/22 130 lb 6.4 oz (59.1 kg)  10/20/22 138 lb (62.6 kg)    Physical Exam Vitals and nursing note reviewed.  Constitutional:      General: She is not in acute distress.    Appearance: Normal appearance. She is not ill-appearing, toxic-appearing or diaphoretic.  HENT:     Head: Normocephalic and atraumatic.     Right Ear: Tympanic membrane, ear canal and external ear normal. There is no impacted cerumen.     Left Ear: Tympanic membrane, ear canal and external ear normal. There is no impacted cerumen.     Nose: Nose normal. No congestion or rhinorrhea.     Mouth/Throat:     Mouth: Mucous membranes are moist.     Pharynx: Oropharynx is clear. No oropharyngeal exudate or posterior oropharyngeal erythema.  Eyes:     General: No scleral icterus.       Right eye: No discharge.        Left eye: No discharge.     Extraocular Movements: Extraocular movements intact.     Conjunctiva/sclera: Conjunctivae normal.     Pupils: Pupils are equal, round, and reactive to light.  Neck:     Vascular: No carotid bruit.  Cardiovascular:     Rate and Rhythm:  Normal rate and regular rhythm.     Pulses: Normal pulses.     Heart sounds: No murmur heard.    No friction rub. No gallop.  Pulmonary:     Effort: Pulmonary effort is normal. No respiratory distress.     Breath sounds:  Normal breath sounds. No stridor. No wheezing, rhonchi or rales.  Chest:     Chest wall: No tenderness.  Abdominal:     General: Abdomen is flat. Bowel sounds are normal. There is no distension.     Palpations: Abdomen is soft. There is no mass.     Tenderness: There is no abdominal tenderness. There is no right CVA tenderness, left CVA tenderness, guarding or rebound.     Hernia: No hernia is present.  Genitourinary:    Comments: Breast and pelvic exams deferred with shared decision making Musculoskeletal:        General: No swelling, tenderness, deformity or signs of injury.     Cervical back: Normal range of motion and neck supple. No rigidity. No muscular tenderness.     Right lower leg: No edema.     Left lower leg: No edema.  Lymphadenopathy:     Cervical: No cervical adenopathy.  Skin:    General: Skin is warm and dry.     Capillary Refill: Capillary refill takes less than 2 seconds.     Coloration: Skin is not jaundiced or pale.     Findings: No bruising, erythema, lesion or rash.  Neurological:     General: No focal deficit present.     Mental Status: She is alert and oriented to person, place, and time. Mental status is at baseline.     Cranial Nerves: No cranial nerve deficit.     Sensory: No sensory deficit.     Motor: No weakness.     Coordination: Coordination normal.     Gait: Gait normal.     Deep Tendon Reflexes: Reflexes normal.  Psychiatric:        Mood and Affect: Mood normal.        Behavior: Behavior normal.        Thought Content: Thought content normal.        Judgment: Judgment normal.     Results for orders placed or performed in visit on 02/07/23  CBC with Differential/Platelet  Result Value Ref Range   WBC 6.2 3.4 - 10.8 x10E3/uL   RBC 4.68 3.77 - 5.28 x10E6/uL   Hemoglobin 13.6 11.1 - 15.9 g/dL   Hematocrit 78.2 95.6 - 46.6 %   MCV 91 79 - 97 fL   MCH 29.1 26.6 - 33.0 pg   MCHC 31.9 31.5 - 35.7 g/dL   RDW 21.3 08.6 - 57.8 %   Platelets  320 150 - 450 x10E3/uL   Neutrophils 52 Not Estab. %   Lymphs 39 Not Estab. %   Monocytes 7 Not Estab. %   Eos 1 Not Estab. %   Basos 1 Not Estab. %   Neutrophils Absolute 3.3 1.4 - 7.0 x10E3/uL   Lymphocytes Absolute 2.4 0.7 - 3.1 x10E3/uL   Monocytes Absolute 0.4 0.1 - 0.9 x10E3/uL   EOS (ABSOLUTE) 0.1 0.0 - 0.4 x10E3/uL   Basophils Absolute 0.0 0.0 - 0.2 x10E3/uL   Immature Granulocytes 0 Not Estab. %   Immature Grans (Abs) 0.0 0.0 - 0.1 x10E3/uL  Comprehensive metabolic panel  Result Value Ref Range   Glucose 95 70 - 99 mg/dL   BUN 10 6 - 24 mg/dL  Creatinine, Ser 0.86 0.57 - 1.00 mg/dL   eGFR 82 >09 WJ/XBJ/4.78   BUN/Creatinine Ratio 12 9 - 23   Sodium 144 134 - 144 mmol/L   Potassium 4.1 3.5 - 5.2 mmol/L   Chloride 107 (H) 96 - 106 mmol/L   CO2 21 20 - 29 mmol/L   Calcium 9.8 8.7 - 10.2 mg/dL   Total Protein 6.4 6.0 - 8.5 g/dL   Albumin 4.5 3.8 - 4.9 g/dL   Globulin, Total 1.9 1.5 - 4.5 g/dL   Bilirubin Total 0.3 0.0 - 1.2 mg/dL   Alkaline Phosphatase 70 44 - 121 IU/L   AST 19 0 - 40 IU/L   ALT 17 0 - 32 IU/L  Lipid Panel w/o Chol/HDL Ratio  Result Value Ref Range   Cholesterol, Total 167 100 - 199 mg/dL   Triglycerides 69 0 - 149 mg/dL   HDL 72 >29 mg/dL   VLDL Cholesterol Cal 13 5 - 40 mg/dL   LDL Chol Calc (NIH) 82 0 - 99 mg/dL  TSH  Result Value Ref Range   TSH 1.780 0.450 - 4.500 uIU/mL  Hgb A1c w/o eAG  Result Value Ref Range   Hgb A1c MFr Bld 6.0 (H) 4.8 - 5.6 %  VITAMIN D 25 Hydroxy (Vit-D Deficiency, Fractures)  Result Value Ref Range   Vit D, 25-Hydroxy 40.6 30.0 - 100.0 ng/mL      Assessment & Plan:   Problem List Items Addressed This Visit       Cardiovascular and Mediastinum   Migraine without aura and without status migrainosus, not intractable    Under good control on current regimen. Continue current regimen. Continue to monitor. Call with any concerns. Refills given.        Relevant Medications   ibuprofen (ADVIL) 800 MG tablet    nortriptyline (PAMELOR) 10 MG capsule   pravastatin (PRAVACHOL) 20 MG tablet   topiramate (TOPAMAX) 25 MG tablet     Endocrine   IFG (impaired fasting glucose)    Rechecking labs today. Await results. Treat as needed.       Relevant Orders   Hgb A1c w/o eAG (Completed)     Other   High cholesterol    Under good control on current regimen. Continue current regimen. Continue to monitor. Call with any concerns. Refills given. Labs drawn today.        Relevant Medications   pravastatin (PRAVACHOL) 20 MG tablet   Vitamin D deficiency    Rechecking labs today. Await results. Treat as needed.       Relevant Orders   VITAMIN D 25 Hydroxy (Vit-D Deficiency, Fractures) (Completed)   Other Visit Diagnoses     Routine general medical examination at a health care facility    -  Primary   Vaccines up to date. Screening labs checked today. Pap and colonoscopy up to date. Mammogram ordered. Continue diet and exercise. Call with any concerns.   Relevant Orders   CBC with Differential/Platelet (Completed)   Comprehensive metabolic panel (Completed)   Lipid Panel w/o Chol/HDL Ratio (Completed)   TSH (Completed)   Encounter for screening mammogram for malignant neoplasm of breast       Mammogram ordered today.   Relevant Orders   MM 3D SCREENING MAMMOGRAM BILATERAL BREAST        Follow up plan: Return in about 6 months (around 08/08/2023).   LABORATORY TESTING:  - Pap smear: up to date  IMMUNIZATIONS:   - Tdap: Tetanus vaccination status reviewed:  last tetanus booster within 10 years. - Influenza: Up to date - Pneumovax: Not applicable - Prevnar: Not applicable - COVID: Refused - HPV: Not applicable - Shingrix vaccine: Refused  SCREENING: -Mammogram: Up to date  - Colonoscopy: Up to date   PATIENT COUNSELING:   Advised to take 1 mg of folate supplement per day if capable of pregnancy.   Sexuality: Discussed sexually transmitted diseases, partner selection, use of condoms,  avoidance of unintended pregnancy  and contraceptive alternatives.   Advised to avoid cigarette smoking.  I discussed with the patient that most people either abstain from alcohol or drink within safe limits (<=14/week and <=4 drinks/occasion for males, <=7/weeks and <= 3 drinks/occasion for females) and that the risk for alcohol disorders and other health effects rises proportionally with the number of drinks per week and how often a drinker exceeds daily limits.  Discussed cessation/primary prevention of drug use and availability of treatment for abuse.   Diet: Encouraged to adjust caloric intake to maintain  or achieve ideal body weight, to reduce intake of dietary saturated fat and total fat, to limit sodium intake by avoiding high sodium foods and not adding table salt, and to maintain adequate dietary potassium and calcium preferably from fresh fruits, vegetables, and low-fat dairy products.    stressed the importance of regular exercise  Injury prevention: Discussed safety belts, safety helmets, smoke detector, smoking near bedding or upholstery.   Dental health: Discussed importance of regular tooth brushing, flossing, and dental visits.    NEXT PREVENTATIVE PHYSICAL DUE IN 1 YEAR. Return in about 6 months (around 08/08/2023).

## 2023-02-07 NOTE — Patient Instructions (Signed)
Please call to schedule your mammogram and/or bone density: [facility]  Address: [address], [place], [place] [address] Phone: [phone]  [facility] [place] [address] [place],  [place]  [address] Phone: [phone]   

## 2023-02-08 LAB — CBC WITH DIFFERENTIAL/PLATELET
Basophils Absolute: 0 10*3/uL (ref 0.0–0.2)
Basos: 1 %
EOS (ABSOLUTE): 0.1 10*3/uL (ref 0.0–0.4)
Eos: 1 %
Hematocrit: 42.6 % (ref 34.0–46.6)
Hemoglobin: 13.6 g/dL (ref 11.1–15.9)
Immature Grans (Abs): 0 10*3/uL (ref 0.0–0.1)
Immature Granulocytes: 0 %
Lymphocytes Absolute: 2.4 10*3/uL (ref 0.7–3.1)
Lymphs: 39 %
MCH: 29.1 pg (ref 26.6–33.0)
MCHC: 31.9 g/dL (ref 31.5–35.7)
MCV: 91 fL (ref 79–97)
Monocytes Absolute: 0.4 10*3/uL (ref 0.1–0.9)
Monocytes: 7 %
Neutrophils Absolute: 3.3 10*3/uL (ref 1.4–7.0)
Neutrophils: 52 %
Platelets: 320 10*3/uL (ref 150–450)
RBC: 4.68 x10E6/uL (ref 3.77–5.28)
RDW: 12.7 % (ref 11.7–15.4)
WBC: 6.2 10*3/uL (ref 3.4–10.8)

## 2023-02-08 LAB — VITAMIN D 25 HYDROXY (VIT D DEFICIENCY, FRACTURES): Vit D, 25-Hydroxy: 40.6 ng/mL (ref 30.0–100.0)

## 2023-02-08 LAB — COMPREHENSIVE METABOLIC PANEL
ALT: 17 [IU]/L (ref 0–32)
AST: 19 [IU]/L (ref 0–40)
Albumin: 4.5 g/dL (ref 3.8–4.9)
Alkaline Phosphatase: 70 [IU]/L (ref 44–121)
BUN/Creatinine Ratio: 12 (ref 9–23)
BUN: 10 mg/dL (ref 6–24)
Bilirubin Total: 0.3 mg/dL (ref 0.0–1.2)
CO2: 21 mmol/L (ref 20–29)
Calcium: 9.8 mg/dL (ref 8.7–10.2)
Chloride: 107 mmol/L — ABNORMAL HIGH (ref 96–106)
Creatinine, Ser: 0.86 mg/dL (ref 0.57–1.00)
Globulin, Total: 1.9 g/dL (ref 1.5–4.5)
Glucose: 95 mg/dL (ref 70–99)
Potassium: 4.1 mmol/L (ref 3.5–5.2)
Sodium: 144 mmol/L (ref 134–144)
Total Protein: 6.4 g/dL (ref 6.0–8.5)
eGFR: 82 mL/min/{1.73_m2} (ref >59)

## 2023-02-08 LAB — LIPID PANEL W/O CHOL/HDL RATIO
Cholesterol, Total: 167 mg/dL (ref 100–199)
HDL: 72 mg/dL (ref >39)
LDL Chol Calc (NIH): 82 mg/dL (ref 0–99)
Triglycerides: 69 mg/dL (ref 0–149)
VLDL Cholesterol Cal: 13 mg/dL (ref 5–40)

## 2023-02-08 LAB — TSH: TSH: 1.78 u[IU]/mL (ref 0.450–4.500)

## 2023-02-08 LAB — HGB A1C W/O EAG: Hgb A1c MFr Bld: 6 % — ABNORMAL HIGH (ref 4.8–5.6)

## 2023-02-10 ENCOUNTER — Encounter: Payer: Self-pay | Admitting: Family Medicine

## 2023-02-10 NOTE — Assessment & Plan Note (Signed)
Rechecking labs today. Await results. Treat as needed.

## 2023-02-10 NOTE — Assessment & Plan Note (Signed)
Rechecking labs today. Await results. Treat as needed.  °

## 2023-02-10 NOTE — Assessment & Plan Note (Signed)
Under good control on current regimen. Continue current regimen. Continue to monitor. Call with any concerns. Refills given. Labs drawn today.   

## 2023-02-10 NOTE — Assessment & Plan Note (Signed)
Under good control on current regimen. Continue current regimen. Continue to monitor. Call with any concerns. Refills given.   

## 2023-05-25 ENCOUNTER — Ambulatory Visit
Admission: RE | Admit: 2023-05-25 | Discharge: 2023-05-25 | Disposition: A | Discharge status: Home / Self Care | Payer: 59 | Source: Ambulatory Visit | Attending: Family Medicine | Admitting: Family Medicine

## 2023-05-25 DIAGNOSIS — Z1231 Encounter for screening mammogram for malignant neoplasm of breast: Secondary | ICD-10-CM | POA: Insufficient documentation

## 2023-05-28 ENCOUNTER — Ambulatory Visit: Payer: 59

## 2023-05-28 ENCOUNTER — Encounter: Payer: Self-pay | Admitting: Family Medicine

## 2023-05-30 ENCOUNTER — Encounter: Payer: Self-pay | Admitting: Family Medicine

## 2023-06-04 ENCOUNTER — Other Ambulatory Visit: Payer: Self-pay

## 2023-06-04 MED ORDER — CETIRIZINE HCL 10 MG PO TABS: 10.0000 mg | ORAL_TABLET | Freq: Every day | ORAL | 4 refills | Status: DC

## 2023-08-08 ENCOUNTER — Ambulatory Visit: Payer: Self-pay | Admitting: Family Medicine

## 2023-08-08 VITALS — BP 116/76 | HR 67 | Temp 97.7°F | Ht 63.0 in | Wt 142.8 lb

## 2023-08-08 DIAGNOSIS — E7800 Pure hypercholesterolemia, unspecified: Secondary | ICD-10-CM | POA: Diagnosis not present

## 2023-08-08 DIAGNOSIS — R7301 Impaired fasting glucose: Secondary | ICD-10-CM | POA: Diagnosis not present

## 2023-08-08 DIAGNOSIS — E559 Vitamin D deficiency, unspecified: Secondary | ICD-10-CM

## 2023-08-08 DIAGNOSIS — J4520 Mild intermittent asthma, uncomplicated: Secondary | ICD-10-CM | POA: Diagnosis not present

## 2023-08-08 DIAGNOSIS — G43009 Migraine without aura, not intractable, without status migrainosus: Secondary | ICD-10-CM

## 2023-08-08 LAB — BAYER DCA HB A1C WAIVED: HB A1C (BAYER DCA - WAIVED): 6.1 % — ABNORMAL HIGH (ref 4.8–5.6)

## 2023-08-08 MED ORDER — BUDESONIDE-FORMOTEROL FUMARATE 160-4.5 MCG/ACT IN AERO: 2.0000 | INHALATION_SPRAY | Freq: Two times a day (BID) | RESPIRATORY_TRACT | 3 refills | Status: DC

## 2023-08-08 MED ORDER — TOPIRAMATE 25 MG PO TABS: 25.0000 mg | ORAL_TABLET | Freq: Two times a day (BID) | ORAL | 6 refills | Status: DC

## 2023-08-08 MED ORDER — NORTRIPTYLINE HCL 10 MG PO CAPS: 10.0000 mg | ORAL_CAPSULE | Freq: Every day | ORAL | 6 refills | Status: DC

## 2023-08-08 MED ORDER — ESOMEPRAZOLE MAGNESIUM 40 MG PO CPDR: 40.0000 mg | DELAYED_RELEASE_CAPSULE | Freq: Every day | ORAL | 6 refills | Status: DC

## 2023-08-08 MED ORDER — PRAVASTATIN SODIUM 20 MG PO TABS: ORAL_TABLET | ORAL | 6 refills | Status: DC

## 2023-08-08 MED ORDER — ALBUTEROL SULFATE HFA 108 (90 BASE) MCG/ACT IN AERS: INHALATION_SPRAY | RESPIRATORY_TRACT | 6 refills | Status: DC

## 2023-08-08 NOTE — Progress Notes (Signed)
 BP 116/76 (BP Location: Left Arm, Patient Position: Sitting, Cuff Size: Normal)   Pulse 67   Temp 97.7 F (36.5 C) (Oral)   Ht 5\' 3"  (1.6 m)   Wt 142 lb 12.8 oz (64.8 kg)   SpO2 100%   BMI 25.30 kg/m    Subjective:    Patient ID: Judy Munoz, female    DOB: July 06, 1970, 53 y.o.   MRN: [id number]  HPI: Judy Munoz is a 53 y.o. female  Chief Complaint  Patient presents with   Hyperlipidemia   Asthma   IFG   Impaired Fasting Glucose HbA1C:  Lab Results  Component Value Date   HGBA1C 6.0 (H) 02/07/2023   Duration of elevated blood sugar: chronic Polydipsia: no Polyuria: no Weight change: no Visual disturbance: no Glucose Monitoring: no Diabetic Education: Not Completed Family history of diabetes: yes  HYPERLIPIDEMIA Hyperlipidemia status: excellent compliance Satisfied with current treatment?  no Side effects:  no Medication compliance: excellent compliance Past cholesterol meds: pravastatin Supplements: none Aspirin:  no The 10-year ASCVD risk score (Arnett DK, et al., 2019) is: 0.6%   Values used to calculate the score:     Age: 66 years     Sex: Female     Is Non-Hispanic African American: No     Diabetic: No     Tobacco smoker: No     Systolic Blood Pressure: 116 mmHg     Is BP treated: No     HDL Cholesterol: 72 mg/dL     Total Cholesterol: 167 mg/dL Chest pain:  no Coronary artery disease:  no  ASTHMA Asthma status: uncontrolled Satisfied with current treatment?: no Albuterol/rescue inhaler frequency: several times a day Dyspnea frequency: several times a day Wheezing frequency: several times a week Cough frequency: several times a day Nocturnal symptom frequency: occasionally Limitation of activity: yes Current upper respiratory symptoms: no Triggers: allergies Aerochamber/spacer use: no Visits to ER or Urgent Care in past year: no Pneumovax: Up to Date Influenza: Up to Date  Migraines have acted up a bit with her allergies.    Relevant past medical, surgical, family and social history reviewed and updated as indicated. Interim medical history since our last visit reviewed. Allergies and medications reviewed and updated.  Review of Systems  Constitutional:  Positive for fatigue. Negative for activity change, appetite change, chills, diaphoresis, fever and unexpected weight change.  HENT: Negative.    Respiratory:  Positive for cough, chest tightness, shortness of breath and wheezing. Negative for apnea, choking and stridor.   Cardiovascular: Negative.   Gastrointestinal: Negative.   Neurological:  Positive for headaches. Negative for dizziness, tremors, seizures, syncope, facial asymmetry, speech difficulty, weakness, light-headedness and numbness.  Psychiatric/Behavioral: Negative.      Per HPI unless specifically indicated above     Objective:    BP 116/76 (BP Location: Left Arm, Patient Position: Sitting, Cuff Size: Normal)   Pulse 67   Temp 97.7 F (36.5 C) (Oral)   Ht 5\' 3"  (1.6 m)   Wt 142 lb 12.8 oz (64.8 kg)   SpO2 100%   BMI 25.30 kg/m   Wt Readings from Last 3 Encounters:  08/08/23 142 lb 12.8 oz (64.8 kg)  02/07/23 135 lb 3.2 oz (61.3 kg)  11/09/22 130 lb 6.4 oz (59.1 kg)    Physical Exam Vitals and nursing note reviewed.  Constitutional:      General: She is not in acute distress.    Appearance: Normal appearance. She is not ill-appearing, toxic-appearing  or diaphoretic.  HENT:     Head: Normocephalic and atraumatic.     Right Ear: External ear normal.     Left Ear: External ear normal.     Nose: Nose normal.     Mouth/Throat:     Mouth: Mucous membranes are moist.     Pharynx: Oropharynx is clear.  Eyes:     General: No scleral icterus.       Right eye: No discharge.        Left eye: No discharge.     Extraocular Movements: Extraocular movements intact.     Conjunctiva/sclera: Conjunctivae normal.     Pupils: Pupils are equal, round, and reactive to light.   Cardiovascular:     Rate and Rhythm: Normal rate and regular rhythm.     Pulses: Normal pulses.     Heart sounds: Normal heart sounds. No murmur heard.    No friction rub. No gallop.  Pulmonary:     Effort: Pulmonary effort is normal. No respiratory distress.     Breath sounds: Normal breath sounds. No stridor. No wheezing, rhonchi or rales.  Chest:     Chest wall: No tenderness.  Musculoskeletal:        General: Normal range of motion.     Cervical back: Normal range of motion and neck supple.  Skin:    General: Skin is warm and dry.     Capillary Refill: Capillary refill takes less than 2 seconds.     Coloration: Skin is not jaundiced or pale.     Findings: No bruising, erythema, lesion or rash.  Neurological:     General: No focal deficit present.     Mental Status: She is alert and oriented to person, place, and time. Mental status is at baseline.  Psychiatric:        Mood and Affect: Mood normal.        Behavior: Behavior normal.        Thought Content: Thought content normal.        Judgment: Judgment normal.     Results for orders placed or performed in visit on 02/07/23  CBC with Differential/Platelet   Collection Time: 02/07/23  9:35 AM  Result Value Ref Range   WBC 6.2 3.4 - 10.8 x10E3/uL   RBC 4.68 3.77 - 5.28 x10E6/uL   Hemoglobin 13.6 11.1 - 15.9 g/dL   Hematocrit 16.1 09.6 - 46.6 %   MCV 91 79 - 97 fL   MCH 29.1 26.6 - 33.0 pg   MCHC 31.9 31.5 - 35.7 g/dL   RDW 04.5 40.9 - 81.1 %   Platelets 320 150 - 450 x10E3/uL   Neutrophils 52 Not Estab. %   Lymphs 39 Not Estab. %   Monocytes 7 Not Estab. %   Eos 1 Not Estab. %   Basos 1 Not Estab. %   Neutrophils Absolute 3.3 1.4 - 7.0 x10E3/uL   Lymphocytes Absolute 2.4 0.7 - 3.1 x10E3/uL   Monocytes Absolute 0.4 0.1 - 0.9 x10E3/uL   EOS (ABSOLUTE) 0.1 0.0 - 0.4 x10E3/uL   Basophils Absolute 0.0 0.0 - 0.2 x10E3/uL   Immature Granulocytes 0 Not Estab. %   Immature Grans (Abs) 0.0 0.0 - 0.1 x10E3/uL   Comprehensive metabolic panel   Collection Time: 02/07/23  9:35 AM  Result Value Ref Range   Glucose 95 70 - 99 mg/dL   BUN 10 6 - 24 mg/dL   Creatinine, Ser 9.14 0.57 - 1.00 mg/dL   eGFR 82 >  59 mL/min/1.73   BUN/Creatinine Ratio 12 9 - 23   Sodium 144 134 - 144 mmol/L   Potassium 4.1 3.5 - 5.2 mmol/L   Chloride 107 (H) 96 - 106 mmol/L   CO2 21 20 - 29 mmol/L   Calcium 9.8 8.7 - 10.2 mg/dL   Total Protein 6.4 6.0 - 8.5 g/dL   Albumin 4.5 3.8 - 4.9 g/dL   Globulin, Total 1.9 1.5 - 4.5 g/dL   Bilirubin Total 0.3 0.0 - 1.2 mg/dL   Alkaline Phosphatase 70 44 - 121 IU/L   AST 19 0 - 40 IU/L   ALT 17 0 - 32 IU/L  Lipid Panel w/o Chol/HDL Ratio   Collection Time: 02/07/23  9:35 AM  Result Value Ref Range   Cholesterol, Total 167 100 - 199 mg/dL   Triglycerides 69 0 - 149 mg/dL   HDL 72 >16 mg/dL   VLDL Cholesterol Cal 13 5 - 40 mg/dL   LDL Chol Calc (NIH) 82 0 - 99 mg/dL  TSH   Collection Time: 02/07/23  9:35 AM  Result Value Ref Range   TSH 1.780 0.450 - 4.500 uIU/mL  Hgb A1c w/o eAG   Collection Time: 02/07/23  9:35 AM  Result Value Ref Range   Hgb A1c MFr Bld 6.0 (H) 4.8 - 5.6 %  VITAMIN D 25 Hydroxy (Vit-D Deficiency, Fractures)   Collection Time: 02/07/23  9:35 AM  Result Value Ref Range   Vit D, 25-Hydroxy 40.6 30.0 - 100.0 ng/mL      Assessment & Plan:   Problem List Items Addressed This Visit       Cardiovascular and Mediastinum   Migraine without aura and without status migrainosus, not intractable   Under good control on current regimen. Continue current regimen. Continue to monitor. Call with any concerns. Refills given.        Relevant Medications   nortriptyline (PAMELOR) 10 MG capsule   pravastatin (PRAVACHOL) 20 MG tablet   topiramate (TOPAMAX) 25 MG tablet     Respiratory   Reactive airway disease   Not doing well with allergies. Will add in symbicort. Call if not getting better or getting worse. Continue to monitor.         Endocrine   IFG  (impaired fasting glucose) - Primary   Up slightly with A1c of 6.1. Continue diet and exercise. Recheck 6 months.       Relevant Orders   CBC with Differential/Platelet   Bayer DCA Hb A1c Waived     Other   High cholesterol   Under good control on current regimen. Continue current regimen. Continue to monitor. Call with any concerns. Refills given. Labs drawn today.        Relevant Medications   pravastatin (PRAVACHOL) 20 MG tablet   Other Relevant Orders   CBC with Differential/Platelet   Comprehensive metabolic panel with GFR   Lipid Panel w/o Chol/HDL Ratio   Vitamin D deficiency   Rechecking labs today. Await results. Treat as needed.       Relevant Orders   CBC with Differential/Platelet   VITAMIN D 25 Hydroxy (Vit-D Deficiency, Fractures)     Follow up plan: Return in about 6 months (around 02/07/2024) for physical.

## 2023-08-08 NOTE — Assessment & Plan Note (Signed)
 Not doing well with allergies. Will add in symbicort. Call if not getting better or getting worse. Continue to monitor.

## 2023-08-08 NOTE — Assessment & Plan Note (Signed)
 Under good control on current regimen. Continue current regimen. Continue to monitor. Call with any concerns. Refills given.

## 2023-08-08 NOTE — Assessment & Plan Note (Signed)
 Under good control on current regimen. Continue current regimen. Continue to monitor. Call with any concerns. Refills given. Labs drawn today.

## 2023-08-08 NOTE — Assessment & Plan Note (Signed)
 Rechecking labs today. Await results. Treat as needed.

## 2023-08-08 NOTE — Assessment & Plan Note (Signed)
 Up slightly with A1c of 6.1. Continue diet and exercise. Recheck 6 months.

## 2023-08-09 LAB — LIPID PANEL W/O CHOL/HDL RATIO
Cholesterol, Total: 174 mg/dL (ref 100–199)
HDL: 68 mg/dL (ref >39)
LDL Chol Calc (NIH): 90 mg/dL (ref 0–99)
Triglycerides: 87 mg/dL (ref 0–149)
VLDL Cholesterol Cal: 16 mg/dL (ref 5–40)

## 2023-08-09 LAB — COMPREHENSIVE METABOLIC PANEL WITH GFR
ALT: 22 IU/L (ref 0–32)
AST: 21 IU/L (ref 0–40)
Albumin: 4.4 g/dL (ref 3.8–4.9)
Alkaline Phosphatase: 72 IU/L (ref 44–121)
BUN/Creatinine Ratio: 14 (ref 9–23)
BUN: 11 mg/dL (ref 6–24)
Bilirubin Total: 0.2 mg/dL (ref 0.0–1.2)
CO2: 22 mmol/L (ref 20–29)
Calcium: 9.6 mg/dL (ref 8.7–10.2)
Chloride: 108 mmol/L — ABNORMAL HIGH (ref 96–106)
Creatinine, Ser: 0.76 mg/dL (ref 0.57–1.00)
Globulin, Total: 1.9 g/dL (ref 1.5–4.5)
Glucose: 95 mg/dL (ref 70–99)
Potassium: 3.9 mmol/L (ref 3.5–5.2)
Sodium: 143 mmol/L (ref 134–144)
Total Protein: 6.3 g/dL (ref 6.0–8.5)
eGFR: 95 mL/min/{1.73_m2} (ref >59)

## 2023-08-09 LAB — CBC WITH DIFFERENTIAL/PLATELET
Basophils Absolute: 0.1 10*3/uL (ref 0.0–0.2)
Basos: 1 %
EOS (ABSOLUTE): 0.1 10*3/uL (ref 0.0–0.4)
Eos: 2 %
Hematocrit: 38.1 % (ref 34.0–46.6)
Hemoglobin: 12.5 g/dL (ref 11.1–15.9)
Immature Grans (Abs): 0 10*3/uL (ref 0.0–0.1)
Immature Granulocytes: 0 %
Lymphocytes Absolute: 2.3 10*3/uL (ref 0.7–3.1)
Lymphs: 40 %
MCH: 28.3 pg (ref 26.6–33.0)
MCHC: 32.8 g/dL (ref 31.5–35.7)
MCV: 86 fL (ref 79–97)
Monocytes Absolute: 0.4 10*3/uL (ref 0.1–0.9)
Monocytes: 8 %
Neutrophils Absolute: 2.8 10*3/uL (ref 1.4–7.0)
Neutrophils: 49 %
Platelets: 299 10*3/uL (ref 150–450)
RBC: 4.41 x10E6/uL (ref 3.77–5.28)
RDW: 13.1 % (ref 11.7–15.4)
WBC: 5.6 10*3/uL (ref 3.4–10.8)

## 2023-08-09 LAB — VITAMIN D 25 HYDROXY (VIT D DEFICIENCY, FRACTURES): Vit D, 25-Hydroxy: 34.6 ng/mL (ref 30.0–100.0)

## 2023-08-14 ENCOUNTER — Encounter: Payer: Self-pay | Admitting: Family Medicine

## 2023-10-02 ENCOUNTER — Ambulatory Visit: Admitting: Family Medicine

## 2023-10-02 ENCOUNTER — Encounter: Payer: Self-pay | Admitting: Family Medicine

## 2023-10-02 VITALS — BP 129/87 | HR 76

## 2023-10-02 DIAGNOSIS — R251 Tremor, unspecified: Secondary | ICD-10-CM

## 2023-10-02 DIAGNOSIS — M5442 Lumbago with sciatica, left side: Secondary | ICD-10-CM

## 2023-10-02 DIAGNOSIS — J4520 Mild intermittent asthma, uncomplicated: Secondary | ICD-10-CM

## 2023-10-02 DIAGNOSIS — M5441 Lumbago with sciatica, right side: Secondary | ICD-10-CM

## 2023-10-02 DIAGNOSIS — M2550 Pain in unspecified joint: Secondary | ICD-10-CM

## 2023-10-02 MED ORDER — PREDNISONE 20 MG PO TABS: 20.0000 mg | ORAL_TABLET | Freq: Every day | ORAL | 0 refills | Status: DC

## 2023-10-02 NOTE — Progress Notes (Signed)
 BP 129/87 (BP Location: Left Arm)   Pulse 76    Subjective:    Patient ID: Judy Munoz, female    DOB: Mar 08, 1971, 53 y.o.   MRN: [id number]  HPI: Judy Munoz is a 53 y.o. female  Chief Complaint  Patient presents with   Fatigue   Torticollis    Pt complains of stiff neck since Sunday. Ibuprofen  has helped some.    Muscle Pain    Onset about a week ago Left hand (knuckles) Left side of neck  Both hips  Both legs   Shaking    Believes the Symbicort  inhaler is causing extreme shakiness. Onset since starting the inhaler.    FATIGUE Duration:  a few days Severity: moderate  Onset: sudden Context when symptoms started:  unknown Symptoms improve with rest: no  Depressive symptoms: no Stress/anxiety: no Insomnia: no  Snoring: no Observed apnea by bed partner: no Daytime hypersomnolence:no Wakes feeling refreshed: yes History of sleep study: no Dysnea on exertion:  no Orthopnea/PND: no Chest pain: no Chronic cough: no Lower extremity edema: no Arthralgias:yes Myalgias: yes Weakness: no Rash: no  ARTHRALGIAS / JOINT ACHES Duration: about a week- worsening Pain: yes Symmetric: no  Severity: moderate Quality: aching, sore Frequency: intermittent Context:  worse Decreased function/range of motion: yes  Erythema: no Swelling: yes Heat or warmth: no Morning stiffness: yes Relief with NSAIDs?: mild Treatments attempted:  rest, ice, heat, APAP, ibuprofen , and aleve   Involved Joints:     Hands: yes left    Wrists: no      Elbows: yes bilateral    Shoulders: yes left    Back: yes     Hips: yes bilateral L>R    Knees: yes bilateral- at night    Ankles: yes bilateral    Feet: no   ASTHMA Asthma status: stable Satisfied with current treatment?: yes Albuterol /rescue inhaler frequency: occasionally Dyspnea frequency: occasionally Wheezing frequency: rarely Cough frequency: occasionally Nocturnal symptom frequency: never  Limitation of activity:  no Current upper respiratory symptoms: no Aerochamber/spacer use: no Visits to ER or Urgent Care in past year: yes Pneumovax: Not up to Date Influenza: Up to Date  Relevant past medical, surgical, family and social history reviewed and updated as indicated. Interim medical history since our last visit reviewed. Allergies and medications reviewed and updated.  Review of Systems  Constitutional:  Positive for fatigue. Negative for activity change, appetite change, chills, diaphoresis, fever and unexpected weight change.  Respiratory: Negative.    Cardiovascular: Negative.   Gastrointestinal: Negative.   Musculoskeletal:  Positive for arthralgias, back pain, gait problem and myalgias. Negative for joint swelling, neck pain and neck stiffness.  Skin: Negative.   Neurological:  Positive for weakness. Negative for dizziness, tremors, seizures, syncope, facial asymmetry, speech difficulty, light-headedness, numbness and headaches.  Psychiatric/Behavioral: Negative.      Per HPI unless specifically indicated above     Objective:     BP 129/87 (BP Location: Left Arm)   Pulse 76   Wt Readings from Last 3 Encounters:  08/08/23 142 lb 12.8 oz (64.8 kg)  02/07/23 135 lb 3.2 oz (61.3 kg)  11/09/22 130 lb 6.4 oz (59.1 kg)    Physical Exam Vitals and nursing note reviewed.  Constitutional:      General: She is not in acute distress.    Appearance: Normal appearance. She is not ill-appearing, toxic-appearing or diaphoretic.  HENT:     Head: Normocephalic and atraumatic.     Right Ear: External  ear normal.     Left Ear: External ear normal.     Nose: Nose normal.     Mouth/Throat:     Mouth: Mucous membranes are moist.     Pharynx: Oropharynx is clear.  Eyes:     General: No scleral icterus.       Right eye: No discharge.        Left eye: No discharge.     Extraocular Movements: Extraocular movements intact.     Conjunctiva/sclera: Conjunctivae normal.     Pupils: Pupils are equal,  round, and reactive to light.  Cardiovascular:     Rate and Rhythm: Normal rate and regular rhythm.     Pulses: Normal pulses.     Heart sounds: Normal heart sounds. No murmur heard.    No friction rub. No gallop.  Pulmonary:     Effort: Pulmonary effort is normal. No respiratory distress.     Breath sounds: Normal breath sounds. No stridor. No wheezing, rhonchi or rales.  Chest:     Chest wall: No tenderness.  Musculoskeletal:        General: Normal range of motion.     Cervical back: Normal range of motion and neck supple.  Skin:    General: Skin is warm and dry.     Capillary Refill: Capillary refill takes less than 2 seconds.     Coloration: Skin is not jaundiced or pale.     Findings: No bruising, erythema, lesion or rash.  Neurological:     General: No focal deficit present.     Mental Status: She is alert and oriented to person, place, and time. Mental status is at baseline.  Psychiatric:        Mood and Affect: Mood normal.        Behavior: Behavior normal.        Thought Content: Thought content normal.        Judgment: Judgment normal.     Results for orders placed or performed in visit on 10/02/23  ANA+ENA+DNA/DS+Antich+Centr   Collection Time: 10/02/23  4:28 PM  Result Value Ref Range   ANA Titer 1 Negative    dsDNA Ab 2 0 - 9 IU/mL   ENA RNP Ab <0.2 0.0 - 0.9 AI   ENA SM Ab Ser-aCnc <0.2 0.0 - 0.9 AI   Scleroderma (Scl-70) (ENA) Antibody, IgG <0.2 0.0 - 0.9 AI   ENA SSA (RO) Ab <0.2 0.0 - 0.9 AI   ENA SSB (LA) Ab <0.2 0.0 - 0.9 AI   Chromatin Ab SerPl-aCnc <0.2 0.0 - 0.9 AI   Anti JO-1 <0.2 0.0 - 0.9 AI   Centromere Ab Screen <0.2 0.0 - 0.9 AI   See below: Comment   CK   Collection Time: 10/02/23  4:28 PM  Result Value Ref Range   Total CK 88 32 - 182 U/L  Uric acid   Collection Time: 10/02/23  4:28 PM  Result Value Ref Range   Uric Acid 3.6 3.0 - 7.2 mg/dL  Ehrlichia antibody panel   Collection Time: 10/02/23  4:28 PM  Result Value Ref Range    E.Chaffeensis (HME) IgG Negative Neg:<1:64   E. Chaffeensis (HME) IgM Titer Negative Neg:<1:20   HGE IgG Titer Negative Neg:<1:64   HGE IgM Titer Negative Neg:<1:20   Result Comment: Comment   Comprehensive metabolic panel with GFR   Collection Time: 10/02/23  4:28 PM  Result Value Ref Range   Glucose 99 70 - 99 mg/dL  BUN 10 6 - 24 mg/dL   Creatinine, Ser 1.61 0.57 - 1.00 mg/dL   eGFR 88 >09 UE/AVW/0.98   BUN/Creatinine Ratio 12 9 - 23   Sodium 144 134 - 144 mmol/L   Potassium 4.0 3.5 - 5.2 mmol/L   Chloride 108 (H) 96 - 106 mmol/L   CO2 19 (L) 20 - 29 mmol/L   Calcium 9.6 8.7 - 10.2 mg/dL   Total Protein 6.7 6.0 - 8.5 g/dL   Albumin 4.7 3.8 - 4.9 g/dL   Globulin, Total 2.0 1.5 - 4.5 g/dL   Bilirubin Total <1.1 0.0 - 1.2 mg/dL   Alkaline Phosphatase 72 44 - 121 IU/L   AST 19 0 - 40 IU/L   ALT 19 0 - 32 IU/L  CBC with Differential/Platelet   Collection Time: 10/02/23  4:28 PM  Result Value Ref Range   WBC 7.0 3.4 - 10.8 x10E3/uL   RBC 4.55 3.77 - 5.28 x10E6/uL   Hemoglobin 13.1 11.1 - 15.9 g/dL   Hematocrit 91.4 78.2 - 46.6 %   MCV 88 79 - 97 fL   MCH 28.8 26.6 - 33.0 pg   MCHC 32.8 31.5 - 35.7 g/dL   RDW 95.6 21.3 - 08.6 %   Platelets 337 150 - 450 x10E3/uL   Neutrophils 50 Not Estab. %   Lymphs 40 Not Estab. %   Monocytes 8 Not Estab. %   Eos 1 Not Estab. %   Basos 1 Not Estab. %   Neutrophils Absolute 3.5 1.4 - 7.0 x10E3/uL   Lymphocytes Absolute 2.8 0.7 - 3.1 x10E3/uL   Monocytes Absolute 0.5 0.1 - 0.9 x10E3/uL   EOS (ABSOLUTE) 0.1 0.0 - 0.4 x10E3/uL   Basophils Absolute 0.0 0.0 - 0.2 x10E3/uL   Immature Granulocytes 0 Not Estab. %   Immature Grans (Abs) 0.0 0.0 - 0.1 x10E3/uL  VITAMIN D  25 Hydroxy (Vit-D Deficiency, Fractures)   Collection Time: 10/02/23  4:28 PM  Result Value Ref Range   Vit D, 25-Hydroxy 44.1 30.0 - 100.0 ng/mL  Spotted Fever Group Antibodies   Collection Time: 10/02/23  4:28 PM  Result Value Ref Range   Spotted Fever Group IgG <1:64  Neg:<1:64   Spotted Fever Group IgM <1:64 Neg:<1:64   Result Comment Comment   RA Qn+CCP(IgG/A)+SjoSSA+SjoSSB   Collection Time: 10/02/23  4:28 PM  Result Value Ref Range   Rheumatoid fact SerPl-aCnc <10.0 <14.0 IU/mL   Cyclic Citrullin Peptide Ab 4 0 - 19 units  Babesia microti Antibody Panel   Collection Time: 10/02/23  4:28 PM  Result Value Ref Range   Babesia microti IgG <1:10 Neg:<1:10   Babesia microti IgM <1:10 Neg:<1:10   Result Comment: Comment   Magnesium    Collection Time: 10/02/23  4:28 PM  Result Value Ref Range   Magnesium  2.1 1.6 - 2.3 mg/dL  Phosphorus   Collection Time: 10/02/23  4:28 PM  Result Value Ref Range   Phosphorus 3.8 3.0 - 4.3 mg/dL  Sed Rate (ESR)   Collection Time: 10/02/23  4:28 PM  Result Value Ref Range   Sed Rate 4 0 - 40 mm/hr  Lyme Disease Serology w/Reflex   Collection Time: 10/02/23  4:28 PM  Result Value Ref Range   Lyme Total Antibody EIA Negative Negative  Specimen status report   Collection Time: 10/02/23  4:28 PM  Result Value Ref Range   specimen status report Comment       Assessment & Plan:   Problem List Items Addressed This  Visit       Respiratory   Reactive airway disease   Tolerating her symbicort  well. Continue to monitor. Call with any concerns.       Other Visit Diagnoses       Tremor    -  Primary   Will check labs. Await results. Treat as needed.   Relevant Orders   Magnesium  (Completed)   Phosphorus (Completed)     Arthralgia, unspecified joint       Will check labs. Await results. Treat as needed.   Relevant Orders   ANA+ENA+DNA/DS+Antich+Centr (Completed)   CK (Completed)   Uric acid (Completed)   Ehrlichia antibody panel (Completed)   Comprehensive metabolic panel with GFR (Completed)   CBC with Differential/Platelet (Completed)   VITAMIN D  25 Hydroxy (Vit-D Deficiency, Fractures) (Completed)   Lyme Disease Serology w/Reflex   Spotted Fever Group Antibodies (Completed)   RA  Qn+CCP(IgG/A)+SjoSSA+SjoSSB (Completed)   Babesia microti Antibody Panel (Completed)   Magnesium  (Completed)   Phosphorus (Completed)   Sed Rate (ESR) (Completed)     Acute bilateral low back pain with bilateral sciatica       In exacerbation. Will treat with steroids and check x-ray. Stretches given. Call with any concerns.   Relevant Medications   predniSONE  (DELTASONE ) 20 MG tablet   Other Relevant Orders   DG Lumbar Spine Complete        Follow up plan: Return in about 2 weeks (around 10/16/2023).

## 2023-10-04 LAB — LYME DISEASE SEROLOGY W/REFLEX: Lyme Total Antibody EIA: NEGATIVE

## 2023-10-04 LAB — SPECIMEN STATUS REPORT

## 2023-10-05 ENCOUNTER — Ambulatory Visit
Admission: RE | Admit: 2023-10-05 | Discharge: 2023-10-05 | Disposition: A | Discharge status: Home / Self Care | Source: Ambulatory Visit | Attending: Family Medicine | Admitting: Family Medicine

## 2023-10-05 ENCOUNTER — Ambulatory Visit
Admission: RE | Admit: 2023-10-05 | Discharge: 2023-10-05 | Disposition: A | Discharge status: Home / Self Care | Attending: Family Medicine | Admitting: Family Medicine

## 2023-10-05 DIAGNOSIS — M5441 Lumbago with sciatica, right side: Secondary | ICD-10-CM | POA: Diagnosis present

## 2023-10-05 DIAGNOSIS — M5442 Lumbago with sciatica, left side: Secondary | ICD-10-CM | POA: Diagnosis present

## 2023-10-08 ENCOUNTER — Ambulatory Visit: Payer: Self-pay | Admitting: Family Medicine

## 2023-10-09 LAB — ANA+ENA+DNA/DS+ANTICH+CENTR
Anti JO-1: <0.2 AI (ref 0.0–0.9)
Centromere Ab Screen: <0.2 AI (ref 0.0–0.9)
Chromatin Ab SerPl-aCnc: <0.2 AI (ref 0.0–0.9)
ENA RNP Ab: <0.2 AI (ref 0.0–0.9)
ENA SM Ab Ser-aCnc: <0.2 AI (ref 0.0–0.9)
ENA SSA (RO) Ab: <0.2 AI (ref 0.0–0.9)
ENA SSB (LA) Ab: <0.2 AI (ref 0.0–0.9)
Scleroderma (Scl-70) (ENA) Antibody, IgG: <0.2 AI (ref 0.0–0.9)
dsDNA Ab: 2 [IU]/mL (ref 0–9)

## 2023-10-09 LAB — CBC WITH DIFFERENTIAL/PLATELET
Basophils Absolute: 0 10*3/uL (ref 0.0–0.2)
Basos: 1 %
EOS (ABSOLUTE): 0.1 10*3/uL (ref 0.0–0.4)
Eos: 1 %
Hematocrit: 40 % (ref 34.0–46.6)
Hemoglobin: 13.1 g/dL (ref 11.1–15.9)
Immature Grans (Abs): 0 10*3/uL (ref 0.0–0.1)
Immature Granulocytes: 0 %
Lymphocytes Absolute: 2.8 10*3/uL (ref 0.7–3.1)
Lymphs: 40 %
MCH: 28.8 pg (ref 26.6–33.0)
MCHC: 32.8 g/dL (ref 31.5–35.7)
MCV: 88 fL (ref 79–97)
Monocytes Absolute: 0.5 10*3/uL (ref 0.1–0.9)
Monocytes: 8 %
Neutrophils Absolute: 3.5 10*3/uL (ref 1.4–7.0)
Neutrophils: 50 %
Platelets: 337 10*3/uL (ref 150–450)
RBC: 4.55 x10E6/uL (ref 3.77–5.28)
RDW: 13 % (ref 11.7–15.4)
WBC: 7 10*3/uL (ref 3.4–10.8)

## 2023-10-09 LAB — COMPREHENSIVE METABOLIC PANEL WITH GFR
ALT: 19 IU/L (ref 0–32)
AST: 19 IU/L (ref 0–40)
Albumin: 4.7 g/dL (ref 3.8–4.9)
Alkaline Phosphatase: 72 IU/L (ref 44–121)
BUN/Creatinine Ratio: 12 (ref 9–23)
BUN: 10 mg/dL (ref 6–24)
Bilirubin Total: <0.2 mg/dL (ref 0.0–1.2)
CO2: 19 mmol/L — ABNORMAL LOW (ref 20–29)
Calcium: 9.6 mg/dL (ref 8.7–10.2)
Chloride: 108 mmol/L — ABNORMAL HIGH (ref 96–106)
Creatinine, Ser: 0.81 mg/dL (ref 0.57–1.00)
Globulin, Total: 2 g/dL (ref 1.5–4.5)
Glucose: 99 mg/dL (ref 70–99)
Potassium: 4 mmol/L (ref 3.5–5.2)
Sodium: 144 mmol/L (ref 134–144)
Total Protein: 6.7 g/dL (ref 6.0–8.5)
eGFR: 88 mL/min/{1.73_m2} (ref >59)

## 2023-10-09 LAB — EHRLICHIA ANTIBODY PANEL
E. Chaffeensis (HME) IgM Titer: NEGATIVE
E.Chaffeensis (HME) IgG: NEGATIVE
HGE IgG Titer: NEGATIVE
HGE IgM Titer: NEGATIVE

## 2023-10-09 LAB — SPOTTED FEVER GROUP ANTIBODIES
Spotted Fever Group IgG: <1:64 {titer}
Spotted Fever Group IgM: <1:64 {titer}

## 2023-10-09 LAB — MAGNESIUM: Magnesium: 2.1 mg/dL (ref 1.6–2.3)

## 2023-10-09 LAB — CK: Total CK: 88 U/L (ref 32–182)

## 2023-10-09 LAB — RA QN+CCP(IGG/A)+SJOSSA+SJOSSB
Cyclic Citrullin Peptide Ab: 4 U (ref 0–19)
Rheumatoid fact SerPl-aCnc: <10 [IU]/mL (ref <14.0)

## 2023-10-09 LAB — BABESIA MICROTI ANTIBODY PANEL
Babesia microti IgG: <1:10 {titer}
Babesia microti IgM: <1:10 {titer}

## 2023-10-09 LAB — SEDIMENTATION RATE: Sed Rate: 4 mm/h (ref 0–40)

## 2023-10-09 LAB — VITAMIN D 25 HYDROXY (VIT D DEFICIENCY, FRACTURES): Vit D, 25-Hydroxy: 44.1 ng/mL (ref 30.0–100.0)

## 2023-10-09 LAB — PHOSPHORUS: Phosphorus: 3.8 mg/dL (ref 3.0–4.3)

## 2023-10-09 LAB — URIC ACID: Uric Acid: 3.6 mg/dL (ref 3.0–7.2)

## 2023-10-10 ENCOUNTER — Encounter: Payer: Self-pay | Admitting: Family Medicine

## 2023-10-10 NOTE — Assessment & Plan Note (Signed)
 Tolerating her symbicort  well. Continue to monitor. Call with any concerns.

## 2023-10-14 MED ORDER — MONTELUKAST SODIUM 10 MG PO TABS: 10.0000 mg | ORAL_TABLET | Freq: Every day | ORAL | 1 refills | Status: DC

## 2023-10-19 ENCOUNTER — Ambulatory Visit: Admitting: Family Medicine

## 2023-10-19 ENCOUNTER — Encounter: Payer: Self-pay | Admitting: Family Medicine

## 2023-10-19 VITALS — BP 123/80 | HR 73 | Temp 97.7°F | Ht 63.0 in | Wt 144.2 lb

## 2023-10-19 DIAGNOSIS — M5442 Lumbago with sciatica, left side: Secondary | ICD-10-CM

## 2023-10-19 DIAGNOSIS — J4520 Mild intermittent asthma, uncomplicated: Secondary | ICD-10-CM | POA: Diagnosis not present

## 2023-10-19 DIAGNOSIS — M5441 Lumbago with sciatica, right side: Secondary | ICD-10-CM

## 2023-10-19 MED ORDER — CYCLOBENZAPRINE HCL 10 MG PO TABS: 5.0000 mg | ORAL_TABLET | Freq: Every evening | ORAL | 0 refills | Status: DC | PRN

## 2023-10-19 NOTE — Assessment & Plan Note (Signed)
 Doing well on her singulair. Will remain off her symbicort . Call with any concerns.

## 2023-10-19 NOTE — Progress Notes (Signed)
 BP 123/80 (BP Location: Left Arm, Patient Position: Sitting, Cuff Size: Normal)   Pulse 73   Temp 97.7 F (36.5 C) (Oral)   Ht 5' 3 (1.6 m)   Wt 144 lb 3.2 oz (65.4 kg)   SpO2 99%   BMI 25.54 kg/m    Subjective:    Patient ID: Judy Munoz, female    DOB: November 13, 1970, 53 y.o.   MRN: [id number]  HPI: Judy Munoz is a 53 y.o. female  Chief Complaint  Patient presents with   Hip Pain    Left still has pain. Hurts worse at night and first thing in the morning   Has been doing great with her singulair. She has not had any other coughing. No more tremor since she stopped the symbicort . Generally starting to feel better. Has had a lot of drainage since taking her prednisone - but it's feeling better now.   BACK PAIN Duration: about 3-4 weeks Mechanism of injury: no trauma Location: upper L back, L hip, L 1st PIP joint Onset: sudden Severity: moderate Quality: tightness Frequency: intermittent Radiation: none Aggravating factors: walking Alleviating factors: prednisone  Status: better Treatments attempted: prednisone   Relief with NSAIDs?: No NSAIDs Taken Nighttime pain:  no Paresthesias / decreased sensation:  no Bowel / bladder incontinence:  no Fevers:  no Dysuria / urinary frequency:  no   Relevant past medical, surgical, family and social history reviewed and updated as indicated. Interim medical history since our last visit reviewed. Allergies and medications reviewed and updated.  Review of Systems  Constitutional: Negative.   Respiratory: Negative.    Cardiovascular: Negative.   Musculoskeletal:  Positive for arthralgias, back pain and myalgias. Negative for gait problem, joint swelling, neck pain and neck stiffness.  Skin: Negative.   Neurological: Negative.   Psychiatric/Behavioral: Negative.      Per HPI unless specifically indicated above     Objective:    BP 123/80 (BP Location: Left Arm, Patient Position: Sitting, Cuff Size: Normal)    Pulse 73   Temp 97.7 F (36.5 C) (Oral)   Ht 5' 3 (1.6 m)   Wt 144 lb 3.2 oz (65.4 kg)   SpO2 99%   BMI 25.54 kg/m   Wt Readings from Last 3 Encounters:  10/19/23 144 lb 3.2 oz (65.4 kg)  08/08/23 142 lb 12.8 oz (64.8 kg)  02/07/23 135 lb 3.2 oz (61.3 kg)    Physical Exam Vitals and nursing note reviewed.  Constitutional:      General: She is not in acute distress.    Appearance: Normal appearance. She is not ill-appearing, toxic-appearing or diaphoretic.  HENT:     Head: Normocephalic and atraumatic.     Right Ear: External ear normal.     Left Ear: External ear normal.     Nose: Nose normal.     Mouth/Throat:     Mouth: Mucous membranes are moist.     Pharynx: Oropharynx is clear.   Eyes:     General: No scleral icterus.       Right eye: No discharge.        Left eye: No discharge.     Extraocular Movements: Extraocular movements intact.     Conjunctiva/sclera: Conjunctivae normal.     Pupils: Pupils are equal, round, and reactive to light.    Cardiovascular:     Rate and Rhythm: Normal rate and regular rhythm.     Pulses: Normal pulses.     Heart sounds: Normal heart sounds. No murmur  heard.    No friction rub. No gallop.  Pulmonary:     Effort: Pulmonary effort is normal. No respiratory distress.     Breath sounds: Normal breath sounds. No stridor. No wheezing, rhonchi or rales.  Chest:     Chest wall: No tenderness.   Musculoskeletal:        General: Tenderness (L trap) present. Normal range of motion.     Cervical back: Normal range of motion and neck supple.   Skin:    General: Skin is warm and dry.     Capillary Refill: Capillary refill takes less than 2 seconds.     Coloration: Skin is not jaundiced or pale.     Findings: No bruising, erythema, lesion or rash.   Neurological:     General: No focal deficit present.     Mental Status: She is alert and oriented to person, place, and time. Mental status is at baseline.   Psychiatric:        Mood and  Affect: Mood normal.        Behavior: Behavior normal.        Thought Content: Thought content normal.        Judgment: Judgment normal.     Results for orders placed or performed in visit on 10/02/23  ANA+ENA+DNA/DS+Antich+Centr   Collection Time: 10/02/23  4:28 PM  Result Value Ref Range   ANA Titer 1 Negative    dsDNA Ab 2 0 - 9 IU/mL   ENA RNP Ab <0.2 0.0 - 0.9 AI   ENA SM Ab Ser-aCnc <0.2 0.0 - 0.9 AI   Scleroderma (Scl-70) (ENA) Antibody, IgG <0.2 0.0 - 0.9 AI   ENA SSA (RO) Ab <0.2 0.0 - 0.9 AI   ENA SSB (LA) Ab <0.2 0.0 - 0.9 AI   Chromatin Ab SerPl-aCnc <0.2 0.0 - 0.9 AI   Anti JO-1 <0.2 0.0 - 0.9 AI   Centromere Ab Screen <0.2 0.0 - 0.9 AI   See below: Comment   CK   Collection Time: 10/02/23  4:28 PM  Result Value Ref Range   Total CK 88 32 - 182 U/L  Uric acid   Collection Time: 10/02/23  4:28 PM  Result Value Ref Range   Uric Acid 3.6 3.0 - 7.2 mg/dL  Ehrlichia antibody panel   Collection Time: 10/02/23  4:28 PM  Result Value Ref Range   E.Chaffeensis (HME) IgG Negative Neg:<1:64   E. Chaffeensis (HME) IgM Titer Negative Neg:<1:20   HGE IgG Titer Negative Neg:<1:64   HGE IgM Titer Negative Neg:<1:20   Result Comment: Comment   Comprehensive metabolic panel with GFR   Collection Time: 10/02/23  4:28 PM  Result Value Ref Range   Glucose 99 70 - 99 mg/dL   BUN 10 6 - 24 mg/dL   Creatinine, Ser 2.13 0.57 - 1.00 mg/dL   eGFR 88 >08 MV/HQI/6.96   BUN/Creatinine Ratio 12 9 - 23   Sodium 144 134 - 144 mmol/L   Potassium 4.0 3.5 - 5.2 mmol/L   Chloride 108 (H) 96 - 106 mmol/L   CO2 19 (L) 20 - 29 mmol/L   Calcium 9.6 8.7 - 10.2 mg/dL   Total Protein 6.7 6.0 - 8.5 g/dL   Albumin 4.7 3.8 - 4.9 g/dL   Globulin, Total 2.0 1.5 - 4.5 g/dL   Bilirubin Total <2.9 0.0 - 1.2 mg/dL   Alkaline Phosphatase 72 44 - 121 IU/L   AST 19 0 - 40 IU/L  ALT 19 0 - 32 IU/L  CBC with Differential/Platelet   Collection Time: 10/02/23  4:28 PM  Result Value Ref Range   WBC 7.0  3.4 - 10.8 x10E3/uL   RBC 4.55 3.77 - 5.28 x10E6/uL   Hemoglobin 13.1 11.1 - 15.9 g/dL   Hematocrit 66.4 40.3 - 46.6 %   MCV 88 79 - 97 fL   MCH 28.8 26.6 - 33.0 pg   MCHC 32.8 31.5 - 35.7 g/dL   RDW 47.4 25.9 - 56.3 %   Platelets 337 150 - 450 x10E3/uL   Neutrophils 50 Not Estab. %   Lymphs 40 Not Estab. %   Monocytes 8 Not Estab. %   Eos 1 Not Estab. %   Basos 1 Not Estab. %   Neutrophils Absolute 3.5 1.4 - 7.0 x10E3/uL   Lymphocytes Absolute 2.8 0.7 - 3.1 x10E3/uL   Monocytes Absolute 0.5 0.1 - 0.9 x10E3/uL   EOS (ABSOLUTE) 0.1 0.0 - 0.4 x10E3/uL   Basophils Absolute 0.0 0.0 - 0.2 x10E3/uL   Immature Granulocytes 0 Not Estab. %   Immature Grans (Abs) 0.0 0.0 - 0.1 x10E3/uL  VITAMIN D  25 Hydroxy (Vit-D Deficiency, Fractures)   Collection Time: 10/02/23  4:28 PM  Result Value Ref Range   Vit D, 25-Hydroxy 44.1 30.0 - 100.0 ng/mL  Spotted Fever Group Antibodies   Collection Time: 10/02/23  4:28 PM  Result Value Ref Range   Spotted Fever Group IgG <1:64 Neg:<1:64   Spotted Fever Group IgM <1:64 Neg:<1:64   Result Comment Comment   RA Qn+CCP(IgG/A)+SjoSSA+SjoSSB   Collection Time: 10/02/23  4:28 PM  Result Value Ref Range   Rheumatoid fact SerPl-aCnc <10.0 <14.0 IU/mL   Cyclic Citrullin Peptide Ab 4 0 - 19 units  Babesia microti Antibody Panel   Collection Time: 10/02/23  4:28 PM  Result Value Ref Range   Babesia microti IgG <1:10 Neg:<1:10   Babesia microti IgM <1:10 Neg:<1:10   Result Comment: Comment   Magnesium    Collection Time: 10/02/23  4:28 PM  Result Value Ref Range   Magnesium  2.1 1.6 - 2.3 mg/dL  Phosphorus   Collection Time: 10/02/23  4:28 PM  Result Value Ref Range   Phosphorus 3.8 3.0 - 4.3 mg/dL  Sed Rate (ESR)   Collection Time: 10/02/23  4:28 PM  Result Value Ref Range   Sed Rate 4 0 - 40 mm/hr  Lyme Disease Serology w/Reflex   Collection Time: 10/02/23  4:28 PM  Result Value Ref Range   Lyme Total Antibody EIA Negative Negative  Specimen  status report   Collection Time: 10/02/23  4:28 PM  Result Value Ref Range   specimen status report Comment       Assessment & Plan:   Problem List Items Addressed This Visit       Respiratory   Reactive airway disease   Doing well on her singulair. Will remain off her symbicort . Call with any concerns.       Other Visit Diagnoses       Acute bilateral low back pain with bilateral sciatica    -  Primary   Improved with the prednisone  but still not feeling like herself. Will start some stretches and will give some flexeril  as needed. Call with any concerns.   Relevant Medications   cyclobenzaprine  (FLEXERIL ) 10 MG tablet        Follow up plan: Return for As scheduled.

## 2023-10-25 ENCOUNTER — Ambulatory Visit: Admitting: Family Medicine

## 2024-02-13 ENCOUNTER — Ambulatory Visit: Admitting: Family Medicine

## 2024-02-13 ENCOUNTER — Encounter: Payer: Self-pay | Admitting: Family Medicine

## 2024-02-13 VITALS — BP 109/73 | HR 73 | Temp 97.8°F | Ht 63.0 in | Wt 148.2 lb

## 2024-02-13 DIAGNOSIS — Z0000 Encounter for general adult medical examination without abnormal findings: Secondary | ICD-10-CM | POA: Diagnosis not present

## 2024-02-13 DIAGNOSIS — E7800 Pure hypercholesterolemia, unspecified: Secondary | ICD-10-CM | POA: Diagnosis not present

## 2024-02-13 DIAGNOSIS — R7301 Impaired fasting glucose: Secondary | ICD-10-CM

## 2024-02-13 DIAGNOSIS — Z23 Encounter for immunization: Secondary | ICD-10-CM

## 2024-02-13 DIAGNOSIS — E559 Vitamin D deficiency, unspecified: Secondary | ICD-10-CM

## 2024-02-13 LAB — BAYER DCA HB A1C WAIVED: HB A1C (BAYER DCA - WAIVED): 5.7 % — ABNORMAL HIGH (ref 4.8–5.6)

## 2024-02-13 MED ORDER — PRAVASTATIN SODIUM 20 MG PO TABS: ORAL_TABLET | ORAL | 6 refills | Status: AC

## 2024-02-13 MED ORDER — CYCLOBENZAPRINE HCL 10 MG PO TABS: 5.0000 mg | ORAL_TABLET | Freq: Every evening | ORAL | 0 refills | Status: AC | PRN

## 2024-02-13 MED ORDER — ALBUTEROL SULFATE HFA 108 (90 BASE) MCG/ACT IN AERS: INHALATION_SPRAY | RESPIRATORY_TRACT | 6 refills | Status: AC

## 2024-02-13 MED ORDER — TOPIRAMATE 25 MG PO TABS: 25.0000 mg | ORAL_TABLET | Freq: Two times a day (BID) | ORAL | 6 refills | Status: AC

## 2024-02-13 MED ORDER — FLUTICASONE PROPIONATE 50 MCG/ACT NA SUSP: 2.0000 | Freq: Every day | NASAL | 12 refills | Status: AC

## 2024-02-13 MED ORDER — ONDANSETRON HCL 8 MG PO TABS: 8.0000 mg | ORAL_TABLET | Freq: Three times a day (TID) | ORAL | 3 refills | Status: AC | PRN

## 2024-02-13 MED ORDER — NORTRIPTYLINE HCL 10 MG PO CAPS: 10.0000 mg | ORAL_CAPSULE | Freq: Every day | ORAL | 6 refills | Status: AC

## 2024-02-13 MED ORDER — CETIRIZINE HCL 10 MG PO TABS: 10.0000 mg | ORAL_TABLET | Freq: Every day | ORAL | 4 refills | Status: AC

## 2024-02-13 MED ORDER — ESOMEPRAZOLE MAGNESIUM 40 MG PO CPDR: 40.0000 mg | DELAYED_RELEASE_CAPSULE | Freq: Every day | ORAL | 6 refills | Status: AC

## 2024-02-13 NOTE — Progress Notes (Signed)
 BP 109/73   Pulse 73   Temp 97.8 F (36.6 C) (Oral)   Ht 5' 3 (1.6 m)   Wt 148 lb 3.2 oz (67.2 kg)   SpO2 99%   BMI 26.25 kg/m    Subjective:    Patient ID: Judy Munoz, female    DOB: Jan 26, 1971, 53 y.o.   MRN: [id number]  HPI: Judy Munoz is a 53 y.o. female presenting on 02/13/2024 for comprehensive medical examination. Current medical complaints include:  Impaired Fasting Glucose HbA1C:  Lab Results  Component Value Date   HGBA1C 5.7 (H) 02/13/2024   Duration of elevated blood sugar: chronic Polydipsia: no Polyuria: no Weight change: no Visual disturbance: no Glucose Monitoring: no Diabetic Education: Completed Family history of diabetes: yes  HYPERLIPIDEMIA Hyperlipidemia status: excellent compliance Satisfied with current treatment?  yes Side effects:  no Medication compliance: excellent compliance Past cholesterol meds: pravastatin  Supplements: none Aspirin :  no The 10-year ASCVD risk score (Arnett DK, et al., 2019) is: 0.7%   Values used to calculate the score:     Age: 46 years     Clincally relevant sex: Female     Is Non-Hispanic African American: No     Diabetic: No     Tobacco smoker: No     Systolic Blood Pressure: 109 mmHg     Is BP treated: No     HDL Cholesterol: 63 mg/dL     Total Cholesterol: 159 mg/dL Chest pain:  no Coronary artery disease:  no  She currently lives with: husband and daughter Menopausal Symptoms: no  Depression Screen done today and results listed below:     02/13/2024    8:08 AM 10/02/2023    3:44 PM 08/08/2023    8:27 AM 02/07/2023    9:43 AM 11/09/2022    3:34 PM  Depression screen PHQ 2/9  Decreased Interest 0 0 0 0 0  Down, Depressed, Hopeless 0 0 0 0 0  PHQ - 2 Score 0 0 0 0 0  Altered sleeping 0 1 0 0 0  Tired, decreased energy 1 3 3  0 3  Change in appetite 0 0 0 0 3  Feeling bad or failure about yourself  0 0 0 0 0  Trouble concentrating 0 0 0 0 0  Moving slowly or fidgety/restless 0 0 0 0 0   Suicidal thoughts 0 0 0 0 0  PHQ-9 Score 1 4 3  0 6  Difficult doing work/chores Not difficult at all Somewhat difficult Not difficult at all Not difficult at all Somewhat difficult    Past Medical History:  Past Medical History:  Diagnosis Date   Allergy Seasonal   Anemia    H/O WITH PREGNANCY   Asthma    Biliary dyskinesia    Elevated glucose    GERD (gastroesophageal reflux disease)    Headache    MIGRAINES RARE   Heart murmur    WAS TOLD THIS ONCE YEARS AGO-HAS NEVER BEEN TOLD SINCE   High cholesterol    History of uterine fibroid    IFG (impaired fasting glucose)    PONV (postoperative nausea and vomiting)    HAPPENED WITH HER 1ST EGD- HER 2ND EGD SHE WAS GIVEN ANTI-EMETIC IV AND HAD NO N/V   Screening for colon cancer 05/18/2022   Trapezius muscle strain    Vitamin D  deficiency     Surgical History:  Past Surgical History:  Procedure Laterality Date   CHOLECYSTECTOMY N/A 07/18/2016   Procedure: LAPAROSCOPIC CHOLECYSTECTOMY;  Surgeon: Laneta JULIANNA Luna, MD;  Location: [facility];  Service: General;  Laterality: N/A;   CHOLECYSTECTOMY, LAPAROSCOPIC  07/18/2016   COLONOSCOPY WITH PROPOFOL  N/A 05/18/2022   Procedure: COLONOSCOPY WITH PROPOFOL ;  Surgeon: Unk Corinn Skiff, MD;  Location: [facility];  Service: Endoscopy;  Laterality: N/A;   ESOPHAGOGASTRODUODENOSCOPY     ESOPHAGOGASTRODUODENOSCOPY (EGD) WITH PROPOFOL  N/A 07/26/2015   Procedure: ESOPHAGOGASTRODUODENOSCOPY (EGD) WITH PROPOFOL ;  Surgeon: Rogelia Copping, MD;  Location: [facility];  Service: Endoscopy;  Laterality: N/A;   ESOPHAGOGASTRODUODENOSCOPY (EGD) WITH PROPOFOL  N/A 05/18/2022   Procedure: ESOPHAGOGASTRODUODENOSCOPY (EGD) WITH PROPOFOL ;  Surgeon: Unk Corinn Skiff, MD;  Location: [facility];  Service: Endoscopy;  Laterality: N/A;   POLYPECTOMY  05/18/2022   Procedure: POLYPECTOMY;  Surgeon: Unk Corinn Skiff, MD;  Location: [facility];  Service: Endoscopy;;    Medications:   Current Outpatient Medications on File Prior to Visit  Medication Sig   cholecalciferol (VITAMIN D3) 25 MCG (1000 UNIT) tablet    ibuprofen  (ADVIL ) 800 MG tablet Take 1 tablet (800 mg total) by mouth every 8 (eight) hours as needed.   levonorgestrel  (MIRENA ) 20 MCG/24HR IUD by Intrauterine route once.    montelukast  (SINGULAIR ) 10 MG tablet Take 1 tablet (10 mg total) by mouth at bedtime.   [DISCONTINUED] fluticasone  furoate-vilanterol (BREO ELLIPTA ) 100-25 MCG/INH AEPB Inhale 1 puff into the lungs daily.   No current facility-administered medications on file prior to visit.    Allergies:  Allergies  Allergen Reactions   Sulfa Antibiotics Hives   Symbicort  [Budesonide -Formoterol  Fumarate] Other (See Comments)    Tremors    Social History:  Social History   Socioeconomic History   Marital status: Married    Spouse name: Not on file   Number of children: Not on file   Years of education: Not on file   Highest education level: Associate degree: occupational, Scientist, product/process development, or vocational program  Occupational History   Not on file  Tobacco Use   Smoking status: Never   Smokeless tobacco: Never  Vaping Use   Vaping status: Never Used  Substance and Sexual Activity   Alcohol use: Never   Drug use: Never   Sexual activity: Yes    Birth control/protection: I.U.D.  Other Topics Concern   Not on file  Social History Narrative   Not on file   Social Drivers of Health   Financial Resource Strain: Low Risk  (02/12/2024)   Overall Financial Resource Strain (CARDIA)    Difficulty of Paying Living Expenses: Not very hard  Food Insecurity: No Food Insecurity (02/12/2024)   Hunger Vital Sign    Worried About Running Out of Food in the Last Year: Never true    Ran Out of Food in the Last Year: Never true  Transportation Needs: No Transportation Needs (02/12/2024)   PRAPARE - Administrator, Civil Service (Medical): No    Lack of Transportation (Non-Medical): No   Physical Activity: Insufficiently Active (02/12/2024)   Exercise Vital Sign    Days of Exercise per Week: 1 day    Minutes of Exercise per Session: 10 min  Stress: No Stress Concern Present (02/12/2024)   [employer] of Occupational Health - Occupational Stress Questionnaire    Feeling of Stress: Not at all  Social Connections: Socially Integrated (02/12/2024)   Social Connection and Isolation Panel    Frequency of Communication with Friends and Family: More than three times a week    Frequency of Social Gatherings with Friends and  Family: Twice a week    Attends Religious Services: More than 4 times per year    Active Member of Clubs or Organizations: Yes    Attends Banker Meetings: More than 4 times per year    Marital Status: Married  Catering manager Violence: Not At Risk (02/13/2024)   Humiliation, Afraid, Rape, and Kick questionnaire    Fear of Current or Ex-Partner: No    Emotionally Abused: No    Physically Abused: No    Sexually Abused: No   Social History   Tobacco Use  Smoking Status Never  Smokeless Tobacco Never   Social History   Substance and Sexual Activity  Alcohol Use Never    Family History:  Family History  Problem Relation Age of Onset   Thyroid disease Mother    Diabetes Father    Heart disease Father    Hypertension Father    Hyperlipidemia Father    Heart attack Brother    Diabetes Brother    Cancer Maternal Grandmother        uterine   Thyroid disease Maternal Grandmother    Esophageal cancer Maternal Grandmother    Lung cancer Maternal Uncle    Breast cancer Neg Hx     Past medical history, surgical history, medications, allergies, family history and social history reviewed with patient today and changes made to appropriate areas of the chart.   Review of Systems  Constitutional:  Positive for diaphoresis. Negative for chills, fever, malaise/fatigue and weight loss.  HENT: Negative.    Eyes: Negative.    Respiratory: Negative.    Cardiovascular: Negative.   Gastrointestinal: Negative.   Genitourinary: Negative.   Musculoskeletal: Negative.   Skin: Negative.   Neurological:  Positive for headaches. Negative for dizziness, tingling, tremors, sensory change, speech change, focal weakness, seizures, loss of consciousness and weakness.  Endo/Heme/Allergies:  Positive for environmental allergies. Negative for polydipsia. Does not bruise/bleed easily.  Psychiatric/Behavioral: Negative.     All other ROS negative except what is listed above and in the HPI.      Objective:    BP 109/73   Pulse 73   Temp 97.8 F (36.6 C) (Oral)   Ht 5' 3 (1.6 m)   Wt 148 lb 3.2 oz (67.2 kg)   SpO2 99%   BMI 26.25 kg/m   Wt Readings from Last 3 Encounters:  02/13/24 148 lb 3.2 oz (67.2 kg)  10/19/23 144 lb 3.2 oz (65.4 kg)  08/08/23 142 lb 12.8 oz (64.8 kg)    Physical Exam Vitals and nursing note reviewed.  Constitutional:      General: She is not in acute distress.    Appearance: Normal appearance. She is normal weight. She is not ill-appearing, toxic-appearing or diaphoretic.  HENT:     Head: Normocephalic and atraumatic.     Right Ear: Tympanic membrane, ear canal and external ear normal. There is no impacted cerumen.     Left Ear: Tympanic membrane, ear canal and external ear normal. There is no impacted cerumen.     Nose: Nose normal. No congestion or rhinorrhea.     Mouth/Throat:     Mouth: Mucous membranes are moist.     Pharynx: Oropharynx is clear. No oropharyngeal exudate or posterior oropharyngeal erythema.  Eyes:     General: No scleral icterus.       Right eye: No discharge.        Left eye: No discharge.     Extraocular Movements: Extraocular movements intact.  Conjunctiva/sclera: Conjunctivae normal.     Pupils: Pupils are equal, round, and reactive to light.  Neck:     Vascular: No carotid bruit.  Cardiovascular:     Rate and Rhythm: Normal rate and regular rhythm.      Pulses: Normal pulses.     Heart sounds: No murmur heard.    No friction rub. No gallop.  Pulmonary:     Effort: Pulmonary effort is normal. No respiratory distress.     Breath sounds: Normal breath sounds. No stridor. No wheezing, rhonchi or rales.  Chest:     Chest wall: No tenderness.  Abdominal:     General: Abdomen is flat. Bowel sounds are normal. There is no distension.     Palpations: Abdomen is soft. There is no mass.     Tenderness: There is no abdominal tenderness. There is no right CVA tenderness, left CVA tenderness, guarding or rebound.     Hernia: No hernia is present.  Genitourinary:    Comments: Breast and pelvic exams deferred with shared decision making Musculoskeletal:        General: No swelling, tenderness, deformity or signs of injury.     Cervical back: Normal range of motion and neck supple. No rigidity. No muscular tenderness.     Right lower leg: No edema.     Left lower leg: No edema.  Lymphadenopathy:     Cervical: No cervical adenopathy.  Skin:    General: Skin is warm and dry.     Capillary Refill: Capillary refill takes less than 2 seconds.     Coloration: Skin is not jaundiced or pale.     Findings: No bruising, erythema, lesion or rash.  Neurological:     General: No focal deficit present.     Mental Status: She is alert and oriented to person, place, and time. Mental status is at baseline.     Cranial Nerves: No cranial nerve deficit.     Sensory: No sensory deficit.     Motor: No weakness.     Coordination: Coordination normal.     Gait: Gait normal.     Deep Tendon Reflexes: Reflexes normal.  Psychiatric:        Mood and Affect: Mood normal.        Behavior: Behavior normal.        Thought Content: Thought content normal.        Judgment: Judgment normal.     Results for orders placed or performed in visit on 02/13/24  Bayer DCA Hb A1c Waived   Collection Time: 02/13/24  8:32 AM  Result Value Ref Range   HB A1C (BAYER DCA - WAIVED)  5.7 (H) 4.8 - 5.6 %  CBC with Differential/Platelet   Collection Time: 02/13/24  8:33 AM  Result Value Ref Range   WBC 5.4 3.4 - 10.8 x10E3/uL   RBC 4.42 3.77 - 5.28 x10E6/uL   Hemoglobin 12.7 11.1 - 15.9 g/dL   Hematocrit 60.3 65.9 - 46.6 %   MCV 90 79 - 97 fL   MCH 28.7 26.6 - 33.0 pg   MCHC 32.1 31.5 - 35.7 g/dL   RDW 87.0 88.2 - 84.5 %   Platelets 322 150 - 450 x10E3/uL   Neutrophils 52 Not Estab. %   Lymphs 39 Not Estab. %   Monocytes 7 Not Estab. %   Eos 1 Not Estab. %   Basos 1 Not Estab. %   Neutrophils Absolute 2.8 1.4 - 7.0 x10E3/uL   Lymphocytes  Absolute 2.1 0.7 - 3.1 x10E3/uL   Monocytes Absolute 0.4 0.1 - 0.9 x10E3/uL   EOS (ABSOLUTE) 0.1 0.0 - 0.4 x10E3/uL   Basophils Absolute 0.0 0.0 - 0.2 x10E3/uL   Immature Granulocytes 0 Not Estab. %   Immature Grans (Abs) 0.0 0.0 - 0.1 x10E3/uL  Comprehensive metabolic panel with GFR   Collection Time: 02/13/24  8:33 AM  Result Value Ref Range   Glucose 101 (H) 70 - 99 mg/dL   BUN 10 6 - 24 mg/dL   Creatinine, Ser 9.28 0.57 - 1.00 mg/dL   eGFR 897 >40 fO/fpw/8.26   BUN/Creatinine Ratio 14 9 - 23   Sodium 141 134 - 144 mmol/L   Potassium 4.0 3.5 - 5.2 mmol/L   Chloride 107 (H) 96 - 106 mmol/L   CO2 22 20 - 29 mmol/L   Calcium 9.4 8.7 - 10.2 mg/dL   Total Protein 6.2 6.0 - 8.5 g/dL   Albumin 4.4 3.8 - 4.9 g/dL   Globulin, Total 1.8 1.5 - 4.5 g/dL   Bilirubin Total 0.4 0.0 - 1.2 mg/dL   Alkaline Phosphatase 75 49 - 135 IU/L   AST 18 0 - 40 IU/L   ALT 16 0 - 32 IU/L  Lipid Panel w/o Chol/HDL Ratio   Collection Time: 02/13/24  8:33 AM  Result Value Ref Range   Cholesterol, Total 159 100 - 199 mg/dL   Triglycerides 76 0 - 149 mg/dL   HDL 63 >60 mg/dL   VLDL Cholesterol Cal 15 5 - 40 mg/dL   LDL Chol Calc (NIH) 81 0 - 99 mg/dL  TSH   Collection Time: 02/13/24  8:33 AM  Result Value Ref Range   TSH 1.750 0.450 - 4.500 uIU/mL  Hepatitis B surface antibody,quantitative   Collection Time: 02/13/24  8:33 AM  Result  Value Ref Range   Hepatitis B Surf Ab Quant 56.2 Immunity>10 mIU/mL      Assessment & Plan:   Problem List Items Addressed This Visit       Endocrine   IFG (impaired fasting glucose)   Rechecking labs today. Await results. Treat as needed.         Other   High cholesterol   Under good control on current regimen. Continue current regimen. Continue to monitor. Call with any concerns. Refills given. Labs drawn today.        Relevant Medications   pravastatin  (PRAVACHOL ) 20 MG tablet   Vitamin D  deficiency   Rechecking labs today. Await results. Treat as needed.       Other Visit Diagnoses       Routine general medical examination at a health care facility    -  Primary   Vaccines up to date/declined. Screening labs checked today. Pap, mammo and colonoscopy up to date. Continue diet and exercise. Call with any concerns.   Relevant Orders   CBC with Differential/Platelet (Completed)   Comprehensive metabolic panel with GFR (Completed)   Lipid Panel w/o Chol/HDL Ratio (Completed)   TSH (Completed)   Bayer DCA Hb A1c Waived (Completed)   Hepatitis B surface antibody,quantitative (Completed)     Needs flu shot       Flu shot given today.   Relevant Orders   Flu vaccine trivalent PF, 6mos and older(Flulaval,Afluria,Fluarix,Fluzone) (Completed)        Follow up plan: Return in about 6 months (around 08/13/2024).   LABORATORY TESTING:  - Pap smear: up to date  IMMUNIZATIONS:   - Tdap: Tetanus vaccination status  reviewed: last tetanus booster within 10 years. - Influenza: Administered today - Prevnar: postponed - COVID: Refused - HPV: Not applicable - Shingrix vaccine: postponed  SCREENING: -Mammogram: Up to date  - Colonoscopy: Up to date   PATIENT COUNSELING:   Advised to take 1 mg of folate supplement per day if capable of pregnancy.   Sexuality: Discussed sexually transmitted diseases, partner selection, use of condoms, avoidance of unintended pregnancy   and contraceptive alternatives.   Advised to avoid cigarette smoking.  I discussed with the patient that most people either abstain from alcohol or drink within safe limits (<=14/week and <=4 drinks/occasion for males, <=7/weeks and <= 3 drinks/occasion for females) and that the risk for alcohol disorders and other health effects rises proportionally with the number of drinks per week and how often a drinker exceeds daily limits.  Discussed cessation/primary prevention of drug use and availability of treatment for abuse.   Diet: Encouraged to adjust caloric intake to maintain  or achieve ideal body weight, to reduce intake of dietary saturated fat and total fat, to limit sodium intake by avoiding high sodium foods and not adding table salt, and to maintain adequate dietary potassium and calcium preferably from fresh fruits, vegetables, and low-fat dairy products.    stressed the importance of regular exercise  Injury prevention: Discussed safety belts, safety helmets, smoke detector, smoking near bedding or upholstery.   Dental health: Discussed importance of regular tooth brushing, flossing, and dental visits.    NEXT PREVENTATIVE PHYSICAL DUE IN 1 YEAR. Return in about 6 months (around 08/13/2024).

## 2024-02-14 LAB — CBC WITH DIFFERENTIAL/PLATELET
Basophils Absolute: 0 x10E3/uL (ref 0.0–0.2)
Basos: 1 %
EOS (ABSOLUTE): 0.1 x10E3/uL (ref 0.0–0.4)
Eos: 1 %
Hematocrit: 39.6 % (ref 34.0–46.6)
Hemoglobin: 12.7 g/dL (ref 11.1–15.9)
Immature Grans (Abs): 0 x10E3/uL (ref 0.0–0.1)
Immature Granulocytes: 0 %
Lymphocytes Absolute: 2.1 x10E3/uL (ref 0.7–3.1)
Lymphs: 39 %
MCH: 28.7 pg (ref 26.6–33.0)
MCHC: 32.1 g/dL (ref 31.5–35.7)
MCV: 90 fL (ref 79–97)
Monocytes Absolute: 0.4 x10E3/uL (ref 0.1–0.9)
Monocytes: 7 %
Neutrophils Absolute: 2.8 x10E3/uL (ref 1.4–7.0)
Neutrophils: 52 %
Platelets: 322 x10E3/uL (ref 150–450)
RBC: 4.42 x10E6/uL (ref 3.77–5.28)
RDW: 12.9 % (ref 11.7–15.4)
WBC: 5.4 x10E3/uL (ref 3.4–10.8)

## 2024-02-14 LAB — COMPREHENSIVE METABOLIC PANEL WITH GFR
ALT: 16 IU/L (ref 0–32)
AST: 18 IU/L (ref 0–40)
Albumin: 4.4 g/dL (ref 3.8–4.9)
Alkaline Phosphatase: 75 IU/L (ref 49–135)
BUN/Creatinine Ratio: 14 (ref 9–23)
BUN: 10 mg/dL (ref 6–24)
Bilirubin Total: 0.4 mg/dL (ref 0.0–1.2)
CO2: 22 mmol/L (ref 20–29)
Calcium: 9.4 mg/dL (ref 8.7–10.2)
Chloride: 107 mmol/L — ABNORMAL HIGH (ref 96–106)
Creatinine, Ser: 0.71 mg/dL (ref 0.57–1.00)
Globulin, Total: 1.8 g/dL (ref 1.5–4.5)
Glucose: 101 mg/dL — ABNORMAL HIGH (ref 70–99)
Potassium: 4 mmol/L (ref 3.5–5.2)
Sodium: 141 mmol/L (ref 134–144)
Total Protein: 6.2 g/dL (ref 6.0–8.5)
eGFR: 102 mL/min/1.73 (ref >59)

## 2024-02-14 LAB — LIPID PANEL W/O CHOL/HDL RATIO
Cholesterol, Total: 159 mg/dL (ref 100–199)
HDL: 63 mg/dL (ref >39)
LDL Chol Calc (NIH): 81 mg/dL (ref 0–99)
Triglycerides: 76 mg/dL (ref 0–149)
VLDL Cholesterol Cal: 15 mg/dL (ref 5–40)

## 2024-02-14 LAB — HEPATITIS B SURFACE ANTIBODY, QUANTITATIVE: Hepatitis B Surf Ab Quant: 56.2 m[IU]/mL

## 2024-02-14 LAB — TSH: TSH: 1.75 u[IU]/mL (ref 0.450–4.500)

## 2024-02-15 ENCOUNTER — Ambulatory Visit: Payer: Self-pay | Admitting: Family Medicine

## 2024-02-17 NOTE — Assessment & Plan Note (Signed)
 Under good control on current regimen. Continue current regimen. Continue to monitor. Call with any concerns. Refills given. Labs drawn today.

## 2024-02-17 NOTE — Assessment & Plan Note (Signed)
 Rechecking labs today. Await results. Treat as needed.

## 2024-03-25 ENCOUNTER — Encounter: Payer: Self-pay | Admitting: Family Medicine

## 2024-03-31 ENCOUNTER — Other Ambulatory Visit: Payer: Self-pay | Admitting: Family Medicine

## 2024-04-02 MED ORDER — PREDNISONE 50 MG PO TABS: 50.0000 mg | ORAL_TABLET | Freq: Every day | ORAL | 0 refills | Status: AC

## 2024-04-03 NOTE — Telephone Encounter (Signed)
 Requested Prescriptions  Pending Prescriptions Disp Refills   montelukast  (SINGULAIR ) 10 MG tablet [Pharmacy Med Name: Montelukast  Sodium 10 MG Oral Tablet] 90 tablet 0    Sig: TAKE 1 TABLET BY MOUTH AT BEDTIME     Pulmonology:  Leukotriene Inhibitors Passed - 04/03/2024 11:16 AM      Passed - Valid encounter within last 12 months    Recent Outpatient Visits           1 month ago Routine general medical examination at a health care facility   [facility], [place] P, DO   5 months ago Acute bilateral low back pain with bilateral sciatica   [facility] [place], Megan P, DO   6 months ago Tremor   [place] [facility] [place], [place] P, DO   7 months ago IFG (impaired fasting glucose)   [facility], [place], DO

## 2024-04-12 ENCOUNTER — Other Ambulatory Visit: Payer: Self-pay | Admitting: Family Medicine

## 2024-04-15 NOTE — Telephone Encounter (Signed)
 Requested medication (s) are due for refill today - expired Rx  Requested medication (s) are on the active medication list -yes  Future visit scheduled -yes  Last refill: 02/07/23 #[id number]  Notes to clinic: expired Rx  Requested Prescriptions  Pending Prescriptions Disp Refills   ibuprofen  (ADVIL ) 800 MG tablet [Pharmacy Med Name: Ibuprofen  800 MG Oral Tablet] 90 tablet 0    Sig: TAKE 1 TABLET BY MOUTH EVERY 8 HOURS AS NEEDED     Analgesics:  NSAIDS Failed - 04/15/2024 12:44 PM      Failed - Manual Review: Labs are only required if the patient has taken medication for more than 8 weeks.      Passed - Cr in normal range and within 360 days    Creatinine, Ser  Date Value Ref Range Status  02/13/2024 0.71 0.57 - 1.00 mg/dL Final         Passed - HGB in normal range and within 360 days    Hemoglobin  Date Value Ref Range Status  02/13/2024 12.7 11.1 - 15.9 g/dL Final         Passed - PLT in normal range and within 360 days    Platelets  Date Value Ref Range Status  02/13/2024 322 150 - 450 x10E3/uL Final         Passed - HCT in normal range and within 360 days    Hematocrit  Date Value Ref Range Status  02/13/2024 39.6 34.0 - 46.6 % Final         Passed - eGFR is 30 or above and within 360 days    GFR calc Af Amer  Date Value Ref Range Status  04/28/2020 87 >59 mL/min/1.73 Final    Comment:    **In accordance with recommendations from the NKF-ASN Task force,**   [facility] is in the process of updating its eGFR calculation to the   2021 CKD-EPI creatinine equation that estimates kidney function   without a race variable.    GFR calc non Af Amer  Date Value Ref Range Status  04/28/2020 76 >59 mL/min/1.73 Final   eGFR  Date Value Ref Range Status  02/13/2024 102 >59 mL/min/1.73 Final         Passed - Patient is not pregnant      Passed - Valid encounter within last 12 months    Recent Outpatient Visits           2 months ago Routine general medical  examination at a health care facility   [facility], [place] P, DO   5 months ago Acute bilateral low back pain with bilateral sciatica   [facility], Megan P, DO   6 months ago Tremor   [facility] [place], [place] P, DO   8 months ago IFG (impaired fasting glucose)   [place] [facility], Megan P, DO                 Requested Prescriptions  Pending Prescriptions Disp Refills   ibuprofen  (ADVIL ) 800 MG tablet [Pharmacy Med Name: Ibuprofen  800 MG Oral Tablet] 90 tablet 0    Sig: TAKE 1 TABLET BY MOUTH EVERY 8 HOURS AS NEEDED     Analgesics:  NSAIDS Failed - 04/15/2024 12:44 PM      Failed - Manual Review: Labs are only required if the patient has taken medication for more than 8 weeks.  Passed - Cr in normal range and within 360 days    Creatinine, Ser  Date Value Ref Range Status  02/13/2024 0.71 0.57 - 1.00 mg/dL Final         Passed - HGB in normal range and within 360 days    Hemoglobin  Date Value Ref Range Status  02/13/2024 12.7 11.1 - 15.9 g/dL Final         Passed - PLT in normal range and within 360 days    Platelets  Date Value Ref Range Status  02/13/2024 322 150 - 450 x10E3/uL Final         Passed - HCT in normal range and within 360 days    Hematocrit  Date Value Ref Range Status  02/13/2024 39.6 34.0 - 46.6 % Final         Passed - eGFR is 30 or above and within 360 days    GFR calc Af Amer  Date Value Ref Range Status  04/28/2020 87 >59 mL/min/1.73 Final    Comment:    **In accordance with recommendations from the NKF-ASN Task force,**   [facility] is in the process of updating its eGFR calculation to the   2021 CKD-EPI creatinine equation that estimates kidney function   without a race variable.    GFR calc non Af Amer  Date Value Ref Range Status  04/28/2020 76 >59 mL/min/1.73 Final   eGFR  Date Value Ref Range Status   02/13/2024 102 >59 mL/min/1.73 Final         Passed - Patient is not pregnant      Passed - Valid encounter within last 12 months    Recent Outpatient Visits           2 months ago Routine general medical examination at a health care facility   [facility], [place] P, DO   5 months ago Acute bilateral low back pain with bilateral sciatica   [place] [facility] [place], Megan P, DO   6 months ago Tremor   [facility] [place], [place] P, DO   8 months ago IFG (impaired fasting glucose)   [facility], [place], DO

## 2024-05-05 ENCOUNTER — Other Ambulatory Visit: Payer: Self-pay | Admitting: Family Medicine

## 2024-05-05 DIAGNOSIS — Z1231 Encounter for screening mammogram for malignant neoplasm of breast: Secondary | ICD-10-CM

## 2024-05-27 ENCOUNTER — Encounter

## 2024-05-30 ENCOUNTER — Encounter

## 2024-05-30 ENCOUNTER — Ambulatory Visit
Admission: RE | Admit: 2024-05-30 | Discharge: 2024-05-30 | Disposition: A | Source: Ambulatory Visit | Attending: Family Medicine

## 2024-05-30 DIAGNOSIS — Z1231 Encounter for screening mammogram for malignant neoplasm of breast: Secondary | ICD-10-CM | POA: Diagnosis present

## 2024-06-03 ENCOUNTER — Ambulatory Visit: Payer: Self-pay | Admitting: Family Medicine

## 2024-08-13 ENCOUNTER — Ambulatory Visit: Admitting: Family Medicine
